# Patient Record
Sex: Male | Born: 1955 | Race: White | Hispanic: No | Smoking: Never smoker
Health system: Southern US, Community
[De-identification: ages and names within clinical notes are randomized; demographics above are authoritative.]

## PROBLEM LIST (undated history)

## (undated) ENCOUNTER — Ambulatory Visit (HOSPITAL_COMMUNITY): Admission: EM | Payer: Medicare HMO | Source: Home / Self Care

## (undated) DIAGNOSIS — I2692 Saddle embolus of pulmonary artery without acute cor pulmonale: Secondary | ICD-10-CM

## (undated) DIAGNOSIS — I82409 Acute embolism and thrombosis of unspecified deep veins of unspecified lower extremity: Secondary | ICD-10-CM

## (undated) DIAGNOSIS — B20 Human immunodeficiency virus [HIV] disease: Secondary | ICD-10-CM

## (undated) DIAGNOSIS — J45909 Unspecified asthma, uncomplicated: Secondary | ICD-10-CM

## (undated) DIAGNOSIS — E785 Hyperlipidemia, unspecified: Secondary | ICD-10-CM

## (undated) DIAGNOSIS — Z21 Asymptomatic human immunodeficiency virus [HIV] infection status: Secondary | ICD-10-CM

## (undated) DIAGNOSIS — B1910 Unspecified viral hepatitis B without hepatic coma: Secondary | ICD-10-CM

## (undated) DIAGNOSIS — J189 Pneumonia, unspecified organism: Secondary | ICD-10-CM

## (undated) DIAGNOSIS — F419 Anxiety disorder, unspecified: Secondary | ICD-10-CM

## (undated) DIAGNOSIS — K219 Gastro-esophageal reflux disease without esophagitis: Secondary | ICD-10-CM

## (undated) DIAGNOSIS — I2699 Other pulmonary embolism without acute cor pulmonale: Secondary | ICD-10-CM

## (undated) DIAGNOSIS — I10 Essential (primary) hypertension: Secondary | ICD-10-CM

## (undated) HISTORY — DX: Essential (primary) hypertension: I10

## (undated) HISTORY — DX: Hyperlipidemia, unspecified: E78.5

## (undated) HISTORY — PX: TONSILLECTOMY: SUR1361

## (undated) HISTORY — DX: Human immunodeficiency virus (HIV) disease: B20

## (undated) HISTORY — PX: COLONOSCOPY: SHX174

## (undated) HISTORY — PX: BACK SURGERY: SHX140

## (undated) HISTORY — PX: APPENDECTOMY: SHX54

## (undated) HISTORY — DX: Asymptomatic human immunodeficiency virus (hiv) infection status: Z21

## (undated) HISTORY — DX: Gastro-esophageal reflux disease without esophagitis: K21.9

---

## 1998-01-22 ENCOUNTER — Encounter: Admission: RE | Admit: 1998-01-22 | Discharge: 1998-01-22 | Payer: Self-pay | Admitting: Infectious Diseases

## 1998-01-31 ENCOUNTER — Encounter: Admission: RE | Admit: 1998-01-31 | Discharge: 1998-01-31 | Payer: Self-pay | Admitting: Infectious Diseases

## 1998-02-02 ENCOUNTER — Ambulatory Visit (HOSPITAL_COMMUNITY): Admission: RE | Admit: 1998-02-02 | Discharge: 1998-02-02 | Payer: Self-pay

## 1998-02-21 ENCOUNTER — Encounter: Admission: RE | Admit: 1998-02-21 | Discharge: 1998-02-21 | Payer: Self-pay | Admitting: Infectious Diseases

## 1998-02-23 ENCOUNTER — Encounter: Admission: RE | Admit: 1998-02-23 | Discharge: 1998-02-23 | Payer: Self-pay | Admitting: Infectious Diseases

## 1998-04-02 ENCOUNTER — Ambulatory Visit (HOSPITAL_COMMUNITY): Admission: RE | Admit: 1998-04-02 | Discharge: 1998-04-02 | Payer: Self-pay

## 1998-04-11 ENCOUNTER — Encounter: Admission: RE | Admit: 1998-04-11 | Discharge: 1998-04-11 | Payer: Self-pay | Admitting: Infectious Diseases

## 1998-04-16 ENCOUNTER — Encounter: Admission: RE | Admit: 1998-04-16 | Discharge: 1998-04-16 | Payer: Self-pay | Admitting: Infectious Diseases

## 1998-05-15 ENCOUNTER — Encounter: Admission: RE | Admit: 1998-05-15 | Discharge: 1998-05-15 | Payer: Self-pay | Admitting: Infectious Diseases

## 1998-06-22 ENCOUNTER — Ambulatory Visit (HOSPITAL_BASED_OUTPATIENT_CLINIC_OR_DEPARTMENT_OTHER): Admission: RE | Admit: 1998-06-22 | Discharge: 1998-06-22 | Payer: Self-pay

## 1998-07-09 ENCOUNTER — Encounter: Admission: RE | Admit: 1998-07-09 | Discharge: 1998-07-09 | Payer: Self-pay | Admitting: Infectious Diseases

## 1998-08-13 ENCOUNTER — Encounter: Admission: RE | Admit: 1998-08-13 | Discharge: 1998-08-13 | Payer: Self-pay | Admitting: Infectious Diseases

## 1998-08-13 ENCOUNTER — Ambulatory Visit (HOSPITAL_COMMUNITY): Admission: RE | Admit: 1998-08-13 | Discharge: 1998-08-13 | Payer: Self-pay | Admitting: Infectious Diseases

## 1998-08-14 ENCOUNTER — Encounter: Admission: RE | Admit: 1998-08-14 | Discharge: 1998-08-14 | Payer: Self-pay | Admitting: Infectious Diseases

## 1998-08-15 ENCOUNTER — Ambulatory Visit (HOSPITAL_COMMUNITY): Admission: RE | Admit: 1998-08-15 | Discharge: 1998-08-15 | Payer: Self-pay | Admitting: Infectious Diseases

## 1998-08-15 ENCOUNTER — Encounter: Admission: RE | Admit: 1998-08-15 | Discharge: 1998-08-15 | Payer: Self-pay | Admitting: Infectious Diseases

## 1998-10-02 ENCOUNTER — Encounter: Admission: RE | Admit: 1998-10-02 | Discharge: 1998-10-02 | Payer: Self-pay | Admitting: Infectious Diseases

## 1998-10-05 ENCOUNTER — Ambulatory Visit (HOSPITAL_COMMUNITY): Admission: RE | Admit: 1998-10-05 | Discharge: 1998-10-05 | Payer: Self-pay | Admitting: *Deleted

## 1998-10-15 ENCOUNTER — Encounter: Admission: RE | Admit: 1998-10-15 | Discharge: 1998-10-15 | Payer: Self-pay | Admitting: Infectious Diseases

## 1998-11-19 ENCOUNTER — Encounter: Admission: RE | Admit: 1998-11-19 | Discharge: 1998-11-19 | Payer: Self-pay | Admitting: Infectious Diseases

## 1999-01-16 ENCOUNTER — Ambulatory Visit (HOSPITAL_COMMUNITY): Admission: RE | Admit: 1999-01-16 | Discharge: 1999-01-16 | Payer: Self-pay | Admitting: Infectious Diseases

## 1999-02-13 ENCOUNTER — Encounter: Admission: RE | Admit: 1999-02-13 | Discharge: 1999-02-13 | Payer: Self-pay | Admitting: Infectious Diseases

## 1999-05-13 ENCOUNTER — Encounter: Admission: RE | Admit: 1999-05-13 | Discharge: 1999-05-13 | Payer: Self-pay | Admitting: Infectious Diseases

## 1999-05-13 ENCOUNTER — Ambulatory Visit (HOSPITAL_COMMUNITY): Admission: RE | Admit: 1999-05-13 | Discharge: 1999-05-13 | Payer: Self-pay | Admitting: Infectious Diseases

## 1999-05-27 ENCOUNTER — Encounter: Admission: RE | Admit: 1999-05-27 | Discharge: 1999-05-27 | Payer: Self-pay | Admitting: Infectious Diseases

## 1999-06-07 HISTORY — PX: SHOULDER ARTHROSCOPY W/ ROTATOR CUFF REPAIR: SHX2400

## 1999-07-01 ENCOUNTER — Encounter: Admission: RE | Admit: 1999-07-01 | Discharge: 1999-07-01 | Payer: Self-pay | Admitting: Infectious Diseases

## 1999-08-05 ENCOUNTER — Encounter: Admission: RE | Admit: 1999-08-05 | Discharge: 1999-08-05 | Payer: Self-pay | Admitting: Infectious Diseases

## 1999-09-09 ENCOUNTER — Encounter: Admission: RE | Admit: 1999-09-09 | Discharge: 1999-09-09 | Payer: Self-pay | Admitting: Infectious Diseases

## 1999-09-18 ENCOUNTER — Encounter: Admission: RE | Admit: 1999-09-18 | Discharge: 1999-09-18 | Payer: Self-pay | Admitting: Infectious Diseases

## 1999-10-16 ENCOUNTER — Encounter: Admission: RE | Admit: 1999-10-16 | Discharge: 1999-10-16 | Payer: Self-pay | Admitting: Infectious Diseases

## 1999-10-16 ENCOUNTER — Ambulatory Visit (HOSPITAL_COMMUNITY): Admission: RE | Admit: 1999-10-16 | Discharge: 1999-10-16 | Payer: Self-pay | Admitting: Infectious Diseases

## 1999-11-20 ENCOUNTER — Encounter: Admission: RE | Admit: 1999-11-20 | Discharge: 1999-11-20 | Payer: Self-pay | Admitting: Infectious Diseases

## 1999-12-11 ENCOUNTER — Encounter: Admission: RE | Admit: 1999-12-11 | Discharge: 1999-12-11 | Payer: Self-pay | Admitting: Infectious Diseases

## 1999-12-25 ENCOUNTER — Encounter: Admission: RE | Admit: 1999-12-25 | Discharge: 1999-12-25 | Payer: Self-pay | Admitting: Infectious Diseases

## 2000-01-29 ENCOUNTER — Encounter: Admission: RE | Admit: 2000-01-29 | Discharge: 2000-01-29 | Payer: Self-pay | Admitting: Infectious Diseases

## 2000-01-29 ENCOUNTER — Ambulatory Visit (HOSPITAL_COMMUNITY): Admission: RE | Admit: 2000-01-29 | Discharge: 2000-01-29 | Payer: Self-pay | Admitting: Infectious Diseases

## 2000-04-15 ENCOUNTER — Encounter: Admission: RE | Admit: 2000-04-15 | Discharge: 2000-04-15 | Payer: Self-pay | Admitting: Infectious Diseases

## 2000-04-15 ENCOUNTER — Ambulatory Visit (HOSPITAL_COMMUNITY): Admission: RE | Admit: 2000-04-15 | Discharge: 2000-04-15 | Payer: Self-pay | Admitting: Infectious Diseases

## 2000-05-06 ENCOUNTER — Encounter: Admission: RE | Admit: 2000-05-06 | Discharge: 2000-05-06 | Payer: Self-pay | Admitting: Infectious Diseases

## 2000-06-18 ENCOUNTER — Encounter: Admission: RE | Admit: 2000-06-18 | Discharge: 2000-06-18 | Payer: Self-pay | Admitting: Infectious Diseases

## 2000-06-19 ENCOUNTER — Encounter: Admission: RE | Admit: 2000-06-19 | Discharge: 2000-06-19 | Payer: Self-pay | Admitting: Infectious Diseases

## 2000-06-27 ENCOUNTER — Emergency Department (HOSPITAL_COMMUNITY): Admission: EM | Admit: 2000-06-27 | Discharge: 2000-06-27 | Payer: Self-pay | Admitting: Emergency Medicine

## 2000-06-30 ENCOUNTER — Emergency Department (HOSPITAL_COMMUNITY): Admission: EM | Admit: 2000-06-30 | Discharge: 2000-06-30 | Payer: Self-pay | Admitting: Emergency Medicine

## 2000-07-13 ENCOUNTER — Ambulatory Visit: Admission: RE | Admit: 2000-07-13 | Discharge: 2000-07-13 | Payer: Self-pay | Admitting: Infectious Diseases

## 2000-07-13 ENCOUNTER — Encounter: Admission: RE | Admit: 2000-07-13 | Discharge: 2000-07-13 | Payer: Self-pay | Admitting: Infectious Diseases

## 2000-07-27 ENCOUNTER — Encounter: Admission: RE | Admit: 2000-07-27 | Discharge: 2000-07-27 | Payer: Self-pay | Admitting: Infectious Diseases

## 2000-09-01 ENCOUNTER — Emergency Department (HOSPITAL_COMMUNITY): Admission: EM | Admit: 2000-09-01 | Discharge: 2000-09-02 | Payer: Self-pay | Admitting: Emergency Medicine

## 2000-09-12 ENCOUNTER — Inpatient Hospital Stay (HOSPITAL_COMMUNITY): Admission: AD | Admit: 2000-09-12 | Discharge: 2000-09-14 | Payer: Self-pay | Admitting: Infectious Diseases

## 2000-09-12 ENCOUNTER — Encounter: Payer: Self-pay | Admitting: Internal Medicine

## 2000-09-14 ENCOUNTER — Encounter: Payer: Self-pay | Admitting: Internal Medicine

## 2000-09-17 ENCOUNTER — Ambulatory Visit (HOSPITAL_COMMUNITY): Admission: RE | Admit: 2000-09-17 | Discharge: 2000-09-17 | Payer: Self-pay | Admitting: Infectious Diseases

## 2000-09-17 ENCOUNTER — Encounter: Payer: Self-pay | Admitting: Infectious Diseases

## 2000-09-21 ENCOUNTER — Encounter: Admission: RE | Admit: 2000-09-21 | Discharge: 2000-09-21 | Payer: Self-pay | Admitting: Infectious Diseases

## 2000-09-21 ENCOUNTER — Ambulatory Visit (HOSPITAL_COMMUNITY): Admission: RE | Admit: 2000-09-21 | Discharge: 2000-09-21 | Payer: Self-pay | Admitting: Infectious Diseases

## 2000-09-21 ENCOUNTER — Encounter: Payer: Self-pay | Admitting: Infectious Diseases

## 2000-09-22 ENCOUNTER — Encounter: Admission: RE | Admit: 2000-09-22 | Discharge: 2000-09-22 | Payer: Self-pay | Admitting: Infectious Diseases

## 2000-10-07 ENCOUNTER — Encounter: Admission: RE | Admit: 2000-10-07 | Discharge: 2000-10-07 | Payer: Self-pay | Admitting: Infectious Diseases

## 2000-10-27 ENCOUNTER — Ambulatory Visit (HOSPITAL_COMMUNITY): Admission: RE | Admit: 2000-10-27 | Discharge: 2000-10-27 | Payer: Self-pay | Admitting: Infectious Diseases

## 2000-10-27 ENCOUNTER — Encounter: Admission: RE | Admit: 2000-10-27 | Discharge: 2000-10-27 | Payer: Self-pay | Admitting: Infectious Diseases

## 2000-11-23 ENCOUNTER — Encounter: Admission: RE | Admit: 2000-11-23 | Discharge: 2000-11-23 | Payer: Self-pay | Admitting: Infectious Diseases

## 2001-03-15 ENCOUNTER — Encounter: Admission: RE | Admit: 2001-03-15 | Discharge: 2001-03-15 | Payer: Self-pay | Admitting: Infectious Diseases

## 2001-03-15 ENCOUNTER — Ambulatory Visit (HOSPITAL_COMMUNITY): Admission: RE | Admit: 2001-03-15 | Discharge: 2001-03-15 | Payer: Self-pay | Admitting: Infectious Diseases

## 2001-03-29 ENCOUNTER — Encounter: Admission: RE | Admit: 2001-03-29 | Discharge: 2001-03-29 | Payer: Self-pay | Admitting: Infectious Diseases

## 2001-06-15 ENCOUNTER — Ambulatory Visit (HOSPITAL_COMMUNITY): Admission: RE | Admit: 2001-06-15 | Discharge: 2001-06-15 | Payer: Self-pay | Admitting: Infectious Diseases

## 2001-06-15 ENCOUNTER — Encounter: Admission: RE | Admit: 2001-06-15 | Discharge: 2001-06-15 | Payer: Self-pay | Admitting: Infectious Diseases

## 2001-07-07 ENCOUNTER — Encounter: Admission: RE | Admit: 2001-07-07 | Discharge: 2001-07-07 | Payer: Self-pay | Admitting: Infectious Diseases

## 2003-07-17 ENCOUNTER — Encounter: Admission: RE | Admit: 2003-07-17 | Discharge: 2003-07-17 | Payer: Self-pay | Admitting: Internal Medicine

## 2004-01-04 ENCOUNTER — Emergency Department (HOSPITAL_COMMUNITY): Admission: EM | Admit: 2004-01-04 | Discharge: 2004-01-04 | Payer: Self-pay | Admitting: Family Medicine

## 2004-09-02 ENCOUNTER — Ambulatory Visit: Payer: Self-pay | Admitting: Internal Medicine

## 2004-10-21 ENCOUNTER — Ambulatory Visit: Payer: Self-pay | Admitting: Internal Medicine

## 2005-03-07 ENCOUNTER — Ambulatory Visit: Payer: Self-pay | Admitting: Internal Medicine

## 2005-03-18 ENCOUNTER — Ambulatory Visit: Payer: Self-pay | Admitting: Internal Medicine

## 2005-04-02 ENCOUNTER — Ambulatory Visit: Payer: Self-pay | Admitting: Internal Medicine

## 2006-06-17 ENCOUNTER — Ambulatory Visit (HOSPITAL_BASED_OUTPATIENT_CLINIC_OR_DEPARTMENT_OTHER): Admission: RE | Admit: 2006-06-17 | Discharge: 2006-06-17 | Payer: Self-pay | Admitting: Orthopedic Surgery

## 2006-10-01 ENCOUNTER — Ambulatory Visit: Payer: Self-pay | Admitting: Internal Medicine

## 2006-10-08 ENCOUNTER — Ambulatory Visit: Payer: Self-pay | Admitting: Internal Medicine

## 2006-11-30 ENCOUNTER — Ambulatory Visit: Payer: Self-pay | Admitting: Internal Medicine

## 2007-01-07 ENCOUNTER — Encounter (INDEPENDENT_AMBULATORY_CARE_PROVIDER_SITE_OTHER): Payer: Self-pay | Admitting: Infectious Diseases

## 2007-07-10 ENCOUNTER — Ambulatory Visit: Payer: Self-pay | Admitting: Internal Medicine

## 2007-08-05 DIAGNOSIS — B20 Human immunodeficiency virus [HIV] disease: Secondary | ICD-10-CM

## 2007-08-10 ENCOUNTER — Ambulatory Visit: Payer: Self-pay | Admitting: Infectious Diseases

## 2007-08-10 LAB — CONVERTED CEMR LAB
ALT: 33 units/L (ref 0–53)
Basophils Relative: 0 % (ref 0–1)
Eosinophils Absolute: 0.2 10*3/uL (ref 0.0–0.7)
Eosinophils Relative: 3 % (ref 0–5)
HCT: 45.6 % (ref 39.0–52.0)
Lymphs Abs: 2.5 10*3/uL (ref 0.7–3.3)
MCHC: 34.2 g/dL (ref 30.0–36.0)
MCV: 101.1 fL — ABNORMAL HIGH (ref 78.0–100.0)
Monocytes Relative: 6 % (ref 3–11)
Neutrophils Relative %: 59 % (ref 43–77)
RBC: 4.51 M/uL (ref 4.22–5.81)
WBC: 7.9 10*3/uL (ref 4.0–10.5)

## 2007-08-17 ENCOUNTER — Encounter: Payer: Self-pay | Admitting: Internal Medicine

## 2007-08-17 ENCOUNTER — Encounter: Admission: RE | Admit: 2007-08-17 | Discharge: 2007-08-17 | Payer: Self-pay | Admitting: Infectious Diseases

## 2007-08-17 ENCOUNTER — Ambulatory Visit: Payer: Self-pay | Admitting: Infectious Diseases

## 2007-08-17 LAB — CONVERTED CEMR LAB
AST: 23 units/L (ref 0–37)
Albumin: 4.6 g/dL (ref 3.5–5.2)
Alkaline Phosphatase: 57 units/L (ref 39–117)
BUN: 18 mg/dL (ref 6–23)
Basophils Relative: 0 % (ref 0–1)
Creatinine, Ser: 1.08 mg/dL (ref 0.40–1.50)
Eosinophils Absolute: 0.2 10*3/uL (ref 0.2–0.7)
Eosinophils Relative: 3 % (ref 0–5)
Glucose, Bld: 98 mg/dL (ref 70–99)
HCT: 45.6 % (ref 39.0–52.0)
HDL: 39 mg/dL — ABNORMAL LOW (ref >39)
Hemoglobin: 15.6 g/dL (ref 13.0–17.0)
Lymphs Abs: 2.6 10*3/uL (ref 0.7–4.0)
MCHC: 34.2 g/dL (ref 30.0–36.0)
MCV: 98.9 fL (ref 78.0–100.0)
Monocytes Absolute: 0.5 10*3/uL (ref 0.1–1.0)
Monocytes Relative: 6 % (ref 3–12)
Potassium: 4.1 meq/L (ref 3.5–5.3)
RBC: 4.61 M/uL (ref 4.22–5.81)
Total Bilirubin: 0.4 mg/dL (ref 0.3–1.2)
Total CHOL/HDL Ratio: 5.3
Triglycerides: 583 mg/dL — ABNORMAL HIGH (ref <150)
WBC: 7.8 10*3/uL (ref 4.0–10.5)

## 2007-08-23 ENCOUNTER — Telehealth: Payer: Self-pay | Admitting: Infectious Diseases

## 2007-10-18 ENCOUNTER — Encounter: Payer: Self-pay | Admitting: *Deleted

## 2007-10-18 DIAGNOSIS — A630 Anogenital (venereal) warts: Secondary | ICD-10-CM

## 2007-10-18 DIAGNOSIS — J45909 Unspecified asthma, uncomplicated: Secondary | ICD-10-CM | POA: Insufficient documentation

## 2007-10-18 DIAGNOSIS — D1803 Hemangioma of intra-abdominal structures: Secondary | ICD-10-CM | POA: Insufficient documentation

## 2007-10-18 DIAGNOSIS — B1910 Unspecified viral hepatitis B without hepatic coma: Secondary | ICD-10-CM | POA: Insufficient documentation

## 2007-10-18 DIAGNOSIS — J301 Allergic rhinitis due to pollen: Secondary | ICD-10-CM

## 2007-10-18 DIAGNOSIS — F329 Major depressive disorder, single episode, unspecified: Secondary | ICD-10-CM

## 2007-10-18 DIAGNOSIS — Z9089 Acquired absence of other organs: Secondary | ICD-10-CM

## 2007-10-18 DIAGNOSIS — J189 Pneumonia, unspecified organism: Secondary | ICD-10-CM

## 2007-10-18 DIAGNOSIS — Z9889 Other specified postprocedural states: Secondary | ICD-10-CM

## 2007-10-18 DIAGNOSIS — E785 Hyperlipidemia, unspecified: Secondary | ICD-10-CM

## 2007-10-18 DIAGNOSIS — K219 Gastro-esophageal reflux disease without esophagitis: Secondary | ICD-10-CM | POA: Insufficient documentation

## 2007-11-04 ENCOUNTER — Encounter: Payer: Self-pay | Admitting: Internal Medicine

## 2007-12-02 ENCOUNTER — Encounter: Payer: Self-pay | Admitting: Internal Medicine

## 2007-12-30 ENCOUNTER — Encounter: Payer: Self-pay | Admitting: Internal Medicine

## 2008-01-20 ENCOUNTER — Encounter: Payer: Self-pay | Admitting: Internal Medicine

## 2008-05-26 ENCOUNTER — Encounter: Payer: Self-pay | Admitting: Internal Medicine

## 2008-07-21 ENCOUNTER — Ambulatory Visit: Payer: Self-pay | Admitting: Internal Medicine

## 2008-07-31 ENCOUNTER — Telehealth: Payer: Self-pay | Admitting: Internal Medicine

## 2008-09-26 ENCOUNTER — Ambulatory Visit: Payer: Self-pay | Admitting: Sports Medicine

## 2008-09-26 DIAGNOSIS — G5760 Lesion of plantar nerve, unspecified lower limb: Secondary | ICD-10-CM

## 2008-09-26 DIAGNOSIS — M722 Plantar fascial fibromatosis: Secondary | ICD-10-CM

## 2008-10-02 ENCOUNTER — Telehealth (INDEPENDENT_AMBULATORY_CARE_PROVIDER_SITE_OTHER): Payer: Self-pay | Admitting: *Deleted

## 2008-10-06 HISTORY — PX: ANTERIOR CERVICAL DECOMP/DISCECTOMY FUSION: SHX1161

## 2008-10-12 ENCOUNTER — Encounter: Payer: Self-pay | Admitting: Internal Medicine

## 2008-10-23 ENCOUNTER — Telehealth: Payer: Self-pay | Admitting: Internal Medicine

## 2008-10-23 ENCOUNTER — Ambulatory Visit: Payer: Self-pay | Admitting: Internal Medicine

## 2008-10-23 LAB — CONVERTED CEMR LAB
BUN: 15 mg/dL (ref 6–23)
CO2: 29 meq/L (ref 19–32)
Calcium: 9.1 mg/dL (ref 8.4–10.5)
Glucose, Bld: 105 mg/dL — ABNORMAL HIGH (ref 70–99)
Sodium: 141 meq/L (ref 135–145)

## 2008-10-24 ENCOUNTER — Telehealth: Payer: Self-pay | Admitting: Internal Medicine

## 2008-11-03 ENCOUNTER — Ambulatory Visit: Payer: Self-pay | Admitting: Sports Medicine

## 2008-11-03 ENCOUNTER — Encounter (INDEPENDENT_AMBULATORY_CARE_PROVIDER_SITE_OTHER): Payer: Self-pay | Admitting: *Deleted

## 2008-11-03 ENCOUNTER — Encounter: Admission: RE | Admit: 2008-11-03 | Discharge: 2008-11-03 | Payer: Self-pay | Admitting: Sports Medicine

## 2008-11-03 ENCOUNTER — Telehealth: Payer: Self-pay | Admitting: Internal Medicine

## 2008-11-03 DIAGNOSIS — M79609 Pain in unspecified limb: Secondary | ICD-10-CM

## 2008-11-06 ENCOUNTER — Encounter (INDEPENDENT_AMBULATORY_CARE_PROVIDER_SITE_OTHER): Payer: Self-pay | Admitting: *Deleted

## 2008-11-10 ENCOUNTER — Ambulatory Visit (HOSPITAL_COMMUNITY): Admission: RE | Admit: 2008-11-10 | Discharge: 2008-11-10 | Payer: Self-pay | Admitting: Sports Medicine

## 2008-11-12 ENCOUNTER — Encounter: Payer: Self-pay | Admitting: Sports Medicine

## 2008-11-13 ENCOUNTER — Encounter: Payer: Self-pay | Admitting: Sports Medicine

## 2008-11-14 ENCOUNTER — Encounter: Payer: Self-pay | Admitting: Sports Medicine

## 2008-11-21 ENCOUNTER — Ambulatory Visit: Payer: Self-pay | Admitting: Sports Medicine

## 2008-11-30 ENCOUNTER — Ambulatory Visit: Payer: Self-pay | Admitting: Sports Medicine

## 2008-12-25 ENCOUNTER — Ambulatory Visit: Payer: Self-pay | Admitting: Sports Medicine

## 2009-01-01 ENCOUNTER — Ambulatory Visit: Payer: Self-pay | Admitting: Sports Medicine

## 2009-01-24 ENCOUNTER — Ambulatory Visit: Payer: Self-pay | Admitting: Internal Medicine

## 2009-01-24 DIAGNOSIS — H8309 Labyrinthitis, unspecified ear: Secondary | ICD-10-CM | POA: Insufficient documentation

## 2009-03-09 ENCOUNTER — Ambulatory Visit: Payer: Self-pay | Admitting: Internal Medicine

## 2009-03-09 DIAGNOSIS — R1013 Epigastric pain: Secondary | ICD-10-CM

## 2009-03-15 ENCOUNTER — Telehealth: Payer: Self-pay | Admitting: Internal Medicine

## 2009-03-15 LAB — CONVERTED CEMR LAB
BUN: 13 mg/dL (ref 6–23)
Basophils Absolute: 0.1 10*3/uL (ref 0.0–0.1)
Chloride: 103 meq/L (ref 96–112)
Creatinine, Ser: 1 mg/dL (ref 0.4–1.5)
Eosinophils Absolute: 0.1 10*3/uL (ref 0.0–0.7)
GFR calc non Af Amer: 82.55 mL/min (ref >60)
Glucose, Bld: 91 mg/dL (ref 70–99)
H Pylori IgG: NEGATIVE
HCT: 46 % (ref 39.0–52.0)
Ketones, ur: NEGATIVE mg/dL
Leukocytes, UA: NEGATIVE
Lipase: 33 units/L (ref 11.0–59.0)
Lymphs Abs: 2.1 10*3/uL (ref 0.7–4.0)
MCV: 99.4 fL (ref 78.0–100.0)
Monocytes Absolute: 0.5 10*3/uL (ref 0.1–1.0)
Platelets: 143 10*3/uL — ABNORMAL LOW (ref 150.0–400.0)
Potassium: 3.8 meq/L (ref 3.5–5.1)
RDW: 12.3 % (ref 11.5–14.6)
Specific Gravity, Urine: 1.01 (ref 1.000–1.030)
Total Bilirubin: 0.8 mg/dL (ref 0.3–1.2)
Urobilinogen, UA: 0.2 (ref 0.0–1.0)

## 2009-04-05 ENCOUNTER — Encounter: Payer: Self-pay | Admitting: Internal Medicine

## 2009-06-26 ENCOUNTER — Ambulatory Visit: Payer: Self-pay | Admitting: Internal Medicine

## 2009-07-03 ENCOUNTER — Ambulatory Visit: Payer: Self-pay | Admitting: Internal Medicine

## 2009-07-19 ENCOUNTER — Encounter: Payer: Self-pay | Admitting: Internal Medicine

## 2009-10-11 ENCOUNTER — Encounter: Payer: Self-pay | Admitting: Internal Medicine

## 2009-10-20 ENCOUNTER — Encounter: Admission: RE | Admit: 2009-10-20 | Discharge: 2009-10-20 | Payer: Self-pay | Admitting: Chiropractic Medicine

## 2010-01-10 ENCOUNTER — Encounter: Payer: Self-pay | Admitting: Internal Medicine

## 2010-06-14 ENCOUNTER — Encounter: Payer: Self-pay | Admitting: Internal Medicine

## 2010-06-20 ENCOUNTER — Telehealth: Payer: Self-pay | Admitting: Internal Medicine

## 2010-06-27 ENCOUNTER — Ambulatory Visit (HOSPITAL_BASED_OUTPATIENT_CLINIC_OR_DEPARTMENT_OTHER): Admission: RE | Admit: 2010-06-27 | Discharge: 2010-06-27 | Payer: Self-pay | Admitting: Orthopedic Surgery

## 2010-07-11 ENCOUNTER — Encounter: Payer: Self-pay | Admitting: Internal Medicine

## 2010-10-27 ENCOUNTER — Encounter: Payer: Self-pay | Admitting: Sports Medicine

## 2010-11-07 NOTE — Letter (Signed)
Summary: Infectious Disease/[facility]  Infectious Disease/[facility]   Imported By: Sherian Rein 07/19/2010 12:13:28  _____________________________________________________________________  External Attachment:    Type:   Image     Comment:   External Document

## 2010-11-07 NOTE — Miscellaneous (Signed)
Summary: Flu Vaccination/[facility]  Flu Vaccination/[facility]   Imported By: Sherian Rein 06/24/2010 12:22:06  _____________________________________________________________________  External Attachment:    Type:   Image     Comment:   External Document

## 2010-11-07 NOTE — Progress Notes (Signed)
    Immunization History:  Influenza Immunization History:    Influenza:  historical (06/14/2010)

## 2010-11-07 NOTE — Letter (Signed)
Summary: [facility]   Imported By: Sherian Rein 01/28/2010 10:56:25  _____________________________________________________________________  External Attachment:    Type:   Image     Comment:   External Document

## 2010-11-07 NOTE — Letter (Signed)
Summary: Infectious Disease/[facility]  Infectious Disease/[facility]   Imported By: Sherian Rein 11/12/2009 09:53:25  _____________________________________________________________________  External Attachment:    Type:   Image     Comment:   External Document

## 2010-12-19 LAB — BASIC METABOLIC PANEL
Calcium: 9.4 mg/dL (ref 8.4–10.5)
GFR calc Af Amer: >60 mL/min (ref >60)
GFR calc non Af Amer: 52 mL/min — ABNORMAL LOW (ref >60)
Glucose, Bld: 104 mg/dL — ABNORMAL HIGH (ref 70–99)
Sodium: 137 mEq/L (ref 135–145)

## 2011-02-21 NOTE — Discharge Summary (Signed)
[name]  Patient:    Michael [name], Michael [name]                       MRN: [id number] Adm. Date:  [id number] Disc. Date: [id number] Attending:  Alfonso Ramus CC:         Outpatient Clinic  Dr. Roxan Hockey.   Discharge Summary  DIAGNOSES: 1.   Pneumonia. 2.   Human immunodeficiency virus. 3.   Depression with anxiety. 4.   History of lipodystrophy.  DISCHARGE MEDICATIONS: 1.   Levaquin 500 mg p.o. q d. 2.   DDI EC 400 mg 1 tab p.o. q h.s. 3.   Kaletra 3 tabs p.o. b.i.d. 4.   Delavirdine 200 mg 3 tabs p.o. b.i.d. 5.   Alprazolam 1 mg p.o. t.i.d. 6.   Klonopin 0.5 mg p.o. t.i.d. p.r.n. anxiety  FOLLOW-UP:  The patient will schedule a follow-up appointment with Dr. Roxan Hockey to evaluate recovery from his pneumonia.  PROCEDURES: 1.   Chest x-ray on 09/12/00 showing an infiltrate in the right lower lobe      suggestive of viral or atypical pneumonia. 2.   Abdominal ultrasound on 09/12/00 showing echogenic liver infiltrates      consistent with hemangiomata but no gallstones. 3.   Chest x-ray on 09/14/00 showing improvement in the infiltrative process      from 09/12/00.  CONSULTANTS:  None.  ADMISSION HISTORY AND PHYSICAL:  Mr. Michael [name] is a 55 year old white male with a history of HIV which was diagnosed in 63 who presented with three day history of fever, headache, malaise, and fatigue.  His last CD4 count was in October of this year and was 400 and he had an undetectable viral load at this time.  His current treatment regimen included DDI, Kaletra, and delavirdine. The patient reports he has had a fever since three days prior to admission with the highest being 103.6.  He also acknowledges anorexia and lethargy but nausea or vomiting.  He has had a slight mild shortness of breath as well as a slight but nonproductive cough.  He went to Urgent Care two days prior to admission and was given Augmentin and Advil without relief of symptoms but with  precipitation of diarrhea.  He also complains of a headache which began frontally and spread to the back of the neck and has been constant since three days prior to admission.  He denies photophobia with it as well as visual changes and denies stiffness of the neck.  The patient reports having had a flu shot in October as well as the pneumovac four to five years ago and has had no known sick contacts.  PAST MEDICAL HISTORY:  Significant for HIV and depression as well as renal stones secondary to Crixivan and idiopathic anaphylactic reactions the last of which occurred on November 28.  MEDICATIONS: 1.   Klonopin 0.5 mg t.i.d. 2.   Xanax 1 mg p.o. t.i.d. 3.   Videx 100 mg q h.s. 4.   Kaletra 3 tabs p.o. b.i.d. 5.   Delavirdine 200 mg 3 tabs p.o. bid 6.   Allegra 60 mg p.o. b.i.d.  FAMILY HISTORY: Significant for a mother who died of CVA and also had diabetes mellitus and multiple sclerosis.  Others are alive and well.  There are no siblings.  SOCIAL HISTORY:   Patient lives in [place] with his partner and is a Higher education careers adviser at [employer].   He denies cigarette, alcohol, or drug use.  PHYSICAL EXAM:  VITAL SIGNS:  Temperature 101.6, pulse 104, blood pressure 140/80, respirations 20, O2 saturation 95% on room air.  GENERAL:  This is a pleasant, well nourished male lying in bed in no acute distress.  HEENT:  Oropharynx is clear.  Mucous membranes moist.  NECK:  Supple without thyromegaly or lymphadenopathy.  CARDIOVASCULAR: Regular rate and rhythm although hypodynamic and tachycardic with pulses 2+ and cap refill within normal limits.  LUNGS:  Bibasilar crackles, right greater than left, and mild CVA tenderness on the right.  ABDOMEN:  Soft, nondistended with normal active bowel sounds but some mild tenderness in the right upper quadrant.  RECTAL:  No masses or tenderness.  He had Hemoccult negative stool.  NEUROLOGIC:  Cranial nerves 2-12 are grossly intact.   Strength is 5/5.  The patient is alert and oriented times three and sensation was grossly intact.  SKIN:  Without rashes and without lipodystrophy; warm to touch.  LABORATORY DATA ON ADMISSION:  BMP 134 for sodium, potassium 3.8, chloride 103, bicarb 24.0, BUN 8.0, creatinine 1.1, glucose 110.  CBC 14.8, white count 15.2, hemoglobin 40.8, hematocrit _____, platelets 185,000, ANC 11.8. Additionally, CK 77, lactic acid 1.4.  TSH 2.157, lipase 19.0, T bili 1.3, alka phos 74, AST 20, ALT 21, albumin 3.4.  An ABG on room air showed pH 7.5, PCO2 29.2, PO2 77.5.  HOSPITAL COURSE:  The patient was admitted with concerns that he may have pneumonia including PCP, community acquired, Mycoplasma influenza or other atypical.  Also mitochondrial toxicity secondary to heart therapy, a UTI and cholecystitis.  The patients antiretrovirals were stopped.  He was begun on IV fluids and given Tylenol p.r.n. for the fever.  Additionally Tequin was started as empiric treatment for pneumonia.  Due to the patients right upper quadrant tenderness and concerns of possible a calculus cholecystitis, a right upper quadrant ultrasound was performed which showed no stones or thickening of the gallbladder wall.  A urine culture and UA was performed to rule out urinary tract infection.  Overnight, the first night, the patient continued to have a sustained fever despite Tylenol therapy.  He was continued on the IV fluids and also encouraged to take liquids by mouth.  On the day following admission, the patient was somewhat better although still complaining of persistent fever and headache because the mitochondrial toxicity appeared to be ruled out with a normal lactic acid level, normal CK level, and normal LFTs, his antiretroviral therapy was restarted at this time.  On the day of discharge, which was two days after admission, the patient was feeling much  better.  He was no longer febrile.  Denied current malaise,  myalgia, or headache or shortness of breath.  A repeat chest x-ray on the morning of discharge showed a decrease in the infiltrates and on exam, the patient had a temperature of 98.7, pulse 92, respirations 20, and blood pressure 138/82. The patient was alert and oriented and in good spirits.  Heart rate was still regular.  Lungs had crackles with the right greater than left but decreased from day of admission.  As stated above, the chest x-ray had improved from the admission chest x-ray.  It was decided that the patient probably had an influenza or atypical pneumonia, however, he remained on the antibiotics p.o. at discharge to cover for a possible community acquired pneumonia.  Blood and urine cultures were also negative at the time of discharge.  Laboratories on the day of discharge revealed a sodium 139, potassium  4.0, chloride 102, bicarb 27.0, BUN 12.0, creatinine 1.3, glucose 115.  CBC showed white count 15.4, hemoglobin 16.5, hematocrit 45.5, platelets 235,000.  DISCHARGE INSTRUCTIONS:  The patient was discharged to home and told to continue bedrest at home until he felt better at which time he could return to his regular daily activities.  He is also encouraged to maintain good p.o. intake should the fever return and take Tylenol for persistent fevers.  The patient will follow-up with Dr. Roxan Hockey in approximately one week. DD:  09/14/00 TD:  09/14/00 Job: [id number]

## 2011-02-21 NOTE — Assessment & Plan Note (Signed)
[facility]                           PRIMARY CARE OFFICE NOTE   NAME:Delangel, KIPPY GOHMAN                       MRN:          [id number]  DATE:11/30/2006                            DOB:          1956-07-03    Mr. Osment is a pleasant 55 year old Caucasian gentleman who presents  today for wellness examination.  He was last seen in the office October 08, 2006 for blood pressure follow up and subsequently he has been  started on low dose hydrochlorothiazide, which he is tolerating well.   PAST SURGICAL HISTORY:  1. Tonsillectomy in 1978.  2. Laparoscopic appendectomy in 2004.  3. Removal of condyloma warts in the past.   MEDICAL ILLNESS:  1. Usual childhood disease.  2. Childhood asthma.  3. Genital warts.  4. Gastrointestinal reflux disease.  5. 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  6. Hyperlipidemia.  7. Hay fever allergies.  8. HIV disease diagnosed in 1985, followed by Dr. Jiles Prows at      [facility].  9. History of pneumonia with hospitalization in 2002.  10.Probable hemangioma of the liver by ultrasound.  11.History of depression.   CURRENT MEDICATIONS:  1. Truvada daily.  2. Ziagen 300 mg b.i.d.  3. Norvir 100 mg 2 tablets b.i.d.  4. Nexium 20 mg daily.  5. Acyclovir 400 mg b.i.d.  6. Lipitor 10 mg daily.  7. Xanax XR 3 mg daily.  8. HCTZ 12.5 mg daily.  9. A list of antiviral drugs is not complete.   HABITS:  Tobacco, none.  Alcohol, averaging 2 ounces per month.   FAMILY HISTORY:  Positive for arthritis in grandparents.  Father with  hyperlipidemia.  Grandfather with MI at age 26, but lived to be 70.  Mother with CVA, died at age 20 with complications of multiple  sclerosis, diabetes as well as her CVA.  Diabetes in her brother and  grandmother.  Pancreatic cancer in a maternal grandmother.  No family  history for lung cancer, colon cancer or prostate cancer.   SOCIAL HISTORY:  The patient has an undergraduate  degree from  __________.  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  He has  obtained an MPH in epidemiology and public health.  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  The patient is in a long term  monogamous relationship.   REVIEW OF SYSTEMS:  The patient has no constitutional, cardiovascular,  respiratory, GI or GU complaints.  The patient does report that he is  intolerant of fat in his diet, leading to significant problems with  severe reflux and nausea and vomiting.  The patient reports he did  undergo right arthroscopy for should repair in September of 2007 and has  made a full recovery.  He does have some knee pain, attribute a loss to  arthritis and he has given up high impact sports training.   EXAMINATION:  Temperature was 98.3, blood pressure 132/79, pulse 63,  weight 172.  GENERAL:  A well-nourished, athletic appearing gentleman in no acute  distress.  HEENT:  Normocephalic, atraumatic, EACs and TMs were unremarkable.  Oropharynx with native dentition in good repair, no buccal or palatal  lesions were noted.  Posterior pharynx was clear.  Conjunctivae and  sclerae were clear.  PERRLA,  EOMI.  Funduscopic exam with hand-held  instrument was unremarkable with normal disc margins and normal retina.  NECK:  Supple without thyromegaly, no cervical lymphadenopathy was noted  in the submandibular, cervical, supraclavicular region.  CHEST:  No CVA tenderness.  LUNGS:  Clear to auscultation and percussion.  CARDIOVASCULAR:  2+ radial pulse, no jugular venous distension, no  carotid bruits.  He had a quiet pericardium with a regular rate and  rhythm without murmurs, rubs or gallops.  ABDOMEN:  Soft, no guarding, no rebound, no organo splenomegaly was  noted.  RECTAL:  Normal sphincter tone was noted.  PROSTATE:  Flat without nodules or abnormalities.  EXTREMITIES:  Without clubbing, cyanosis, edema, deformity.  NEUROLOGIC:  Nonfocal.   LABORATORY:  No  labs were available since he is followed closely at  [facility]  I have requested labs from the last 1-2 years.   CHART REVIEW:  The patient had a full colonoscopy April 02, 2005 which  was negative and he would be due for follow up in 10 years.  X-ray, the  patient did have a CT scan of the abdomen and pelvis December of 2001,  which showed 2 well circumscribed liver lesions typical for hemangiomas.  The patient had a chest x-ray on September 21, 2000, which showed a  resolution of patchy infiltrates.  No recent chest x-ray available to  me.   ASSESSMENT/PLAN:  1. This is a normal wellness examination for this gentleman.  2. Lipids, the patient is followed by Dr. Sheral Flow who has his laboratory      and manages his lipid medication.  3. Anxiety, well controlled with Xanax XR.  4. Gastrointestinal, well controlled with Nexium 20 mg q. a.m.  5. Health maintenance, the patient is currently with colorectal cancer      screening.  Normal prostate exam was performed.  We discussed the      pros and cons of PSA screening, at this time defer.   In summary, he is a very pleasant gentleman who has recovered nicely  from his shoulder surgery.  He seems to be medically stable and in good  health.  He has asked to return to see me on a p.r.n. basis.     Rosalyn Gess Norins, MD  Electronically Signed    MEN/MedQ  DD: 11/30/2006  DT: 12/01/2006  Job #: [id number]   cc:   Lysbeth Galas, MD

## 2011-02-21 NOTE — Op Note (Signed)
[name], Michael                ACCOUNT NO.:  [id number]   MEDICAL RECORD NO.:  [id number]          PATIENT TYPE:  AMB   LOCATION:  [facility]                          FACILITY:  [facility]   PHYSICIAN:  Loreta Ave, M.D. DATE OF BIRTH:  04/28/1956   DATE OF PROCEDURE:  06/17/2006  DATE OF DISCHARGE:                                 OPERATIVE REPORT   PREOPERATIVE DIAGNOSES:  Right shoulder impingement, inferior labrum tear,  distal clavicle osteolysis.   POSTOPERATIVE DIAGNOSES:  Right shoulder impingement, inferior labrum tear,  distal clavicle osteolysis.   PROCEDURES:  Right shoulder examination under anesthesia, arthroscopy with  debridement of labrum and rotator cuff, acromioplasty, coracoacromial  ligament release, excision of distal clavicle.   SURGEON:  Loreta Ave, M.D.   ASSISTANT:  Genene Churn. Denton Meek.   ANESTHESIA:  General.   BLOOD LOSS:  Minimal.   SPECIMENS:  None.   CULTURES:  None.   COMPLICATIONS:  None.   DRESSING:  Soft compressive with sling.   DESCRIPTION OF PROCEDURE:  The patient was brought to the operating room,  and after adequate anesthesia had been obtained, the shoulder was examined.  Full motion and good stability.  Placed in a beach-chair position on the  shoulder positioner and prepped and draped in the usual sterile fashion.  Three portals, anterior, posterior and lateral.  The shoulder was entered  with a blunt obturator, distended and inspected.  A fair amount of partial  tearing, crescent region, supraspinatus tendon; undersurface debrided.  No  full-thickness tears.  Cable attached.  Biceps tendon, biceps anchor intact.  Complex tearing, inferior labrum, with a small paralabral cyst off the  bottom of the glenoid; all of this debrided, tapered and smoothed.  Remaining articular cartilage of other structures in the shoulder looked  good.  Cannula redirected subacromially.  Reactive bursitis, bony  impingement, type 2 acromion,  grade 3 chondral changes, impingement of AC  joint.  Bursa bursectomy, cuff debrided.  Acromioplasty to a type I acromion  with shaver and a high-speed bur.  Distal clavicle exposed and peri-  articular spurs and the lateral centimeter of clavicle resected.  Adequacy  of decompression and clavicle excision confirmed viewing from all portals.  Instruments and fluid removed.  The portals were closed with 4-0 nylon.  A  sterile compressive dressing was applied.  A sling was applied.  Anesthesia  was reversed.  Brought to the recovery room.  Tolerated the surgery well  with no complications.      Loreta Ave, M.D.  Electronically Signed     DFM/MEDQ  D:  06/17/2006  T:  06/17/2006  Job:  [id number]

## 2011-02-21 NOTE — Assessment & Plan Note (Signed)
[facility]                           PRIMARY CARE OFFICE NOTE   NAME:Michael [name], Michael [name]                       MRN:          [id number]  DATE:10/08/2006                            DOB:          [birth year]    [name] is a 55 year old gentleman, well known to the practice.  He  has been seen for several blood pressure checks because of a  persistently mild rising blood pressure.  He presents today for  followup, and was found to have a blood pressure of 143/80.  At the last  office visit in June 2002, it was 140/80.  The patient did have a blood  pressure check recently that was also showing a mild elevation.  He has  been asymptomatic.   We discussed this at length in regard to ideal blood pressure of 120/80  and blood pressure treatment goals.  At this point, given the patient's  past medical history, I think it is reasonable to consider starting a  low-dose diuretic in order to bring his blood pressure into the 130  range or lower for adequate control.  He was subsequently started on  hydrochlorothiazide 12.5 mg daily.   The patient is due for a full physical exam.  We did discuss the  relative merits of PSA screening and the controversies therein.  He will  schedule an extended office visit for a physical exam at his  convenience.     Rosalyn Gess Norins, MD  Electronically Signed    MEN/MedQ  DD: 10/09/2006  DT: 10/09/2006  Job #: [id number]

## 2011-07-04 ENCOUNTER — Telehealth: Payer: Self-pay | Admitting: Cardiovascular Disease

## 2011-07-04 NOTE — Telephone Encounter (Signed)
Pt was scheduled for 10-1 for surgical clearence and pt is requesting an echo

## 2011-07-04 NOTE — Telephone Encounter (Signed)
Patient has an surgical clearance office visit on 07/07/11 with Dr. Clifton James, patient is requesting to have an echo done the same day. He wants to make sure his heart is okay because he had an EKG done and it was bad. Patient was made aware that Dr. Clifton James needs to agree for him to have an echo before we can  order it .  I Will send this message go MD's desktop. Okay with patient.

## 2011-07-07 ENCOUNTER — Encounter: Payer: Self-pay | Admitting: Cardiovascular Disease

## 2011-07-07 ENCOUNTER — Ambulatory Visit (INDEPENDENT_AMBULATORY_CARE_PROVIDER_SITE_OTHER): Payer: Self-pay | Admitting: Cardiovascular Disease

## 2011-07-07 VITALS — BP 129/78 | HR 79 | Ht 65.5 in | Wt 176.4 lb

## 2011-07-07 DIAGNOSIS — Z01810 Encounter for preprocedural cardiovascular examination: Secondary | ICD-10-CM

## 2011-07-07 DIAGNOSIS — R9431 Abnormal electrocardiogram [ECG] [EKG]: Secondary | ICD-10-CM

## 2011-07-07 NOTE — Patient Instructions (Signed)
Your physician recommends that you schedule a follow-up appointment as needed with Dr. McAlhany   

## 2011-07-07 NOTE — Progress Notes (Signed)
History of Present Illness:55 yo male with history of HTN, HLD, HIV, GERD who is here today for cardiac evaluation prior to planned cosmetic eyelid surgery (bilateral upper blepharoplasty). He had an EKG as part of his pre-surgical workup which showed normal sinus rhythm with small isolated Q-wave in lead III. No other abnormalities.   He tells me that he feels well. He has had no chest pain, SOB, palpitations. He has been exercising at [employer].   Past Medical History  Diagnosis Date  . HTN (hypertension)   . Hyperlipidemia   . HIV (human immunodeficiency virus infection)   . GERD (gastroesophageal reflux disease)     Past Surgical History  Procedure Date  . Rotator cuff surgery     right and left  . Laminectomy     C4/C5  . Appendectomy     Current Outpatient Prescriptions  Medication Sig Dispense Refill  . ALPRAZolam (XANAX XR) 3 MG 24 hr tablet Take 3 mg by mouth daily.        Marland Kitchen atorvastatin (LIPITOR) 10 MG tablet Take 10 mg by mouth daily.        . darunavir (PREZISTA) 400 MG tablet Take 800 mg by mouth daily.        Marland Kitchen emtricitabine-tenofovir (TRUVADA) 200-300 MG per tablet Take 1 tablet by mouth daily.        Marland Kitchen esomeprazole (NEXIUM) 40 MG capsule Take 40 mg by mouth 2 (two) times daily.        . Omega-3 Fatty Acids (FISH OIL) 1000 MG CAPS Take 1 capsule by mouth 2 (two) times daily.        . raltegravir (ISENTRESS) 400 MG tablet Take 400 mg by mouth 2 (two) times daily.        . ritonavir (NORVIR) 100 MG capsule Take 200 mg by mouth 2 (two) times daily.          Allergies  Allergen Reactions  . Efavirenz     History   Social History  . Marital Status: Divorced    Spouse Name: N/A    Number of Children: N/A  . Years of Education: N/A   Occupational History  . Clinical research [employer]   Social History Main Topics  . Smoking status: Never Smoker   . Smokeless tobacco: Never Used  . Alcohol Use: Not on file  . Drug Use: Not on file  . Sexually Active: Not on file    Other Topics Concern  . Not on file   Social History Narrative  . No narrative on file    Family History  Problem Relation Age of Onset  . Diabetes Mother   . Heart attack Paternal Grandfather     Review of Systems:  As stated in the HPI and otherwise negative.   BP 129/78  Pulse 79  Ht 5' 5.5" (1.664 m)  Wt 176 lb 6.4 oz (80.015 kg)  BMI 28.91 kg/m2  Physical Examination: General: Well developed, well nourished, NAD HEENT: OP clear, mucus membranes moist SKIN: warm, dry. No rashes. Neuro: No focal deficits Musculoskeletal: Muscle strength 5/5 all ext Psychiatric: Mood and affect normal Neck: No JVD, no carotid bruits, no thyromegaly, no lymphadenopathy. Lungs:Clear bilaterally, no wheezes, rhonci, crackles Cardiovascular: Regular rate and rhythm. No murmurs, gallops or rubs. Abdomen:Soft. Bowel sounds present. Non-tender.  Extremities: No lower extremity edema. Pulses are 2 + in the bilateral DP/PT.  ZOX:WRUEAV sinus rhythm, rate 72 bpm. Normal EKG.

## 2011-07-07 NOTE — Assessment & Plan Note (Signed)
His EKG today is normal. The EKG from the outside office had a small Q-wave isolated in lead III, no clinical significance. He has no chest pain, SOB or palpitations. No further cardiac workup needed. He can proceed to his planned surgery without additional cardiac testing.

## 2011-07-07 NOTE — Telephone Encounter (Signed)
Reviewed. Will d/w pt today. cdm

## 2011-07-15 LAB — T-HELPER CELL (CD4) - (RCID CLINIC ONLY): CD4 T Cell Abs: 410

## 2011-07-21 ENCOUNTER — Encounter: Payer: Self-pay | Admitting: Cardiovascular Disease

## 2012-10-13 ENCOUNTER — Ambulatory Visit (INDEPENDENT_AMBULATORY_CARE_PROVIDER_SITE_OTHER): Payer: Managed Care, Other (non HMO) | Admitting: Sports Medicine

## 2012-10-13 VITALS — BP 136/70 | Ht 65.0 in | Wt 170.0 lb

## 2012-10-13 DIAGNOSIS — M79609 Pain in unspecified limb: Secondary | ICD-10-CM

## 2012-10-13 DIAGNOSIS — M6740 Ganglion, unspecified site: Secondary | ICD-10-CM

## 2012-10-13 DIAGNOSIS — M79673 Pain in unspecified foot: Secondary | ICD-10-CM

## 2012-10-13 NOTE — Progress Notes (Signed)
  Subjective:    Patient ID: Michael [name], male    DOB: 1955-10-23, 57 y.o.   MRN: [id number]  HPI chief complaint: Left foot pain  57 year old male comes in today complaining of 2 weeks of dorsal left foot pain. He describes an aching discomfort along the lateral dorsal midfoot region. He has noticed some swelling in this area. He denies any trauma. His pain is constantly present but worse with direct pressure over the area such as wearing tight shoes. He denies similar problems in the past but does have a history of plantar fasciitis which was treated by Dr. Darrick Penna with some orthotics. He was recently seen at [facility] an x-ray of his foot was ordered by Dr. Farris Has. It is negative per the patient's report. Dr. Farris Has had recommended an MRI scan of his left foot but the patient would like to proceed with a less expensive ultrasound if possible. He is getting some associated numbness at the tip of his fourth and fifth toes but denies numbness elsewhere in the foot. No plantar foot pain. No fevers or chills.  Past medical history and current medications are reviewed. Past medical history is significant for HIV, hepatitis B, reflux disease Surgical history is significant for bilateral rotator cuff repairs done by Dr. Eulah Pont     Review of Systems     Objective:   Physical Exam Well-developed, well-nourished. No acute distress. Vital signs are reviewed  Left foot: There is a tiny palpable cystic area in the lateral portion of the midfoot dorsally. It is tender to palpation. Not erythematous. No pain with metatarsal squeeze. Extensor tendons are intact. Neurovascularly intact distally. Walking with a noticeable limp.  MSK ultrasound of the left foot: Images were obtained in long and short views. There is an obvious ganglion cyst in the lateral, dorsal midfoot region. It appears to originate from the underlying joint space. Extensor tendons are within normal limits, that the cyst  does apply pressure against the extensor tendon that leads to the fourth toe. Tendon moves independently of the ganglion cyst with dynamic flexion and extension of the toes. The underlying fourth metatarsal appears unremarkable with no evidence of stress fracture.      Assessment & Plan:  1. Left foot pain secondary to dorsal ganglion cyst  We discussed the treatment options with the patient today including a cortisone injection. Patient agreed to cortisone injection. Consent was obtained and risks of injection were explained including risk of infection and hypopigmentation. Under sterile technique and under ultrasound guidance, 0.2 cc of 10 mg Kenalog and 0.1 cc of Xylocaine were injected directly into the cyst. Patient tolerated the procedure without difficulty. An arch strap and doughnut pad for compression to be worn daily until symptoms improve. Patient will avoid constricting shoe wear. Upon questioning the patient a little further, he revealed to me that he started doing some sprinting on his treadmill a couple weeks before his pain began. This is likely a major contributor to his symptoms. I recommended avoiding running until pain improves. I've also recommended that he resume wearing his custom orthotics which were constructed for him by Dr. Darrick Penna. Patient will followup for ongoing or recalcitrant issues.

## 2012-10-14 ENCOUNTER — Other Ambulatory Visit: Payer: Managed Care, Other (non HMO) | Admitting: Sports Medicine

## 2013-02-07 ENCOUNTER — Other Ambulatory Visit: Payer: Self-pay | Admitting: *Deleted

## 2013-02-07 DIAGNOSIS — I83893 Varicose veins of bilateral lower extremities with other complications: Secondary | ICD-10-CM

## 2013-02-17 ENCOUNTER — Ambulatory Visit: Payer: Managed Care, Other (non HMO) | Admitting: Physician Assistant

## 2013-02-24 ENCOUNTER — Ambulatory Visit (INDEPENDENT_AMBULATORY_CARE_PROVIDER_SITE_OTHER): Payer: Managed Care, Other (non HMO) | Admitting: Sports Medicine

## 2013-02-24 ENCOUNTER — Encounter: Payer: Self-pay | Admitting: Sports Medicine

## 2013-02-24 VITALS — BP 132/82 | HR 71 | Ht 65.0 in | Wt 170.0 lb

## 2013-02-24 DIAGNOSIS — I83813 Varicose veins of bilateral lower extremities with pain: Secondary | ICD-10-CM

## 2013-02-24 DIAGNOSIS — I83893 Varicose veins of bilateral lower extremities with other complications: Secondary | ICD-10-CM

## 2013-02-24 DIAGNOSIS — M25552 Pain in left hip: Secondary | ICD-10-CM

## 2013-02-24 DIAGNOSIS — M25569 Pain in unspecified knee: Secondary | ICD-10-CM

## 2013-02-24 DIAGNOSIS — M25559 Pain in unspecified hip: Secondary | ICD-10-CM

## 2013-02-24 MED ORDER — NITROGLYCERIN 0.2 MG/HR TD PT24: MEDICATED_PATCH | TRANSDERMAL | Status: DC

## 2013-02-24 NOTE — Patient Instructions (Addendum)
Nitroglycerin Protocol   Apply 1/4 nitroglycerin patch to affected area daily.  Change position of patch within the affected area every 24 hours.  You may experience a headache during the first 1-2 weeks of using the patch, these should subside.  If you experience headaches after beginning nitroglycerin patch treatment, you may take your preferred over the counter pain reliever.  Another side effect of the nitroglycerin patch is skin irritation or rash related to patch adhesive.  Please notify our office if you develop more severe headaches or rash, and stop the patch.  Tendon healing with nitroglycerin patch may require 12 to 24 weeks depending on the extent of injury.  Men should not use if taking Viagra, Cialis, or Levitra.   Do not use if you have migraines or rosacea.   Please do suggested exercises daily  Ice painful area twice daily  Try calf sleeves with running and a lot of standing/walking and as needed  Please follow up 4 weeks  Thank you for seeing [place] today!

## 2013-02-24 NOTE — Progress Notes (Signed)
  Subjective:    Patient ID: Michael [name], male    DOB: Feb 27, 1956, 57 y.o.   MRN: [id number]  HPI  Pt presents to clinic for evaluation of lt hip pain that started about 3-4 months ago. Pain is located over lateral hip and into the posterior aspect.  Started after interval training on treadmill. Thought it was muscular initially- had an intense massage that did not alleviate pain. Takes advil as needed at bedtime.  Also concerned about varicose veins that he has in lower legs. States when he runs the varicose veins bulge, pulsate, and become painful.  Has to stop running due to pain.  His father had significant problems with varicose veins   Review of Systems     Objective:   Physical Exam No acute distress  Hip ROM good bilat FABER good bilat  Hip flexion strength good on rt Hip abduction strong on rt Gluteus medius testing not painful TFL testing pain on rt Rt SI joint moves freely  Along the insertion of the gluteus medius and the tensor fascia lata to the iliac crest there is palpable tenderness and it gets more severe just past the midline posteriorly        Assessment & Plan:

## 2013-02-24 NOTE — Assessment & Plan Note (Signed)
Musculoskeletal ultrasound The iliac crest is scanned from the anterior to the posterior border of the left There is some small splitting of the fascia at the tensor fascia lata Just past the midline at the insertion of the gluteus medius into the iliac crests is a large hypoechoic area Michael [name] does not have much increase in Doppler flow Muscle and tendons do not appear torn but do have hypoechoic change  Use ice  Nitroglycerin protocol Gentle rehabilitation program  Recheck 4-6 weeks and substitute biking for running until healing enough to have limited pain

## 2013-02-24 NOTE — Assessment & Plan Note (Signed)
I gave him compression sleeves to try on ankles  And lower calves use for standing or running  Recheck if symptoms worsen

## 2013-03-28 ENCOUNTER — Encounter: Payer: Managed Care, Other (non HMO) | Admitting: Vascular Surgery

## 2013-03-29 ENCOUNTER — Ambulatory Visit (INDEPENDENT_AMBULATORY_CARE_PROVIDER_SITE_OTHER): Payer: Managed Care, Other (non HMO) | Admitting: Sports Medicine

## 2013-03-29 VITALS — BP 123/76 | Ht 65.0 in | Wt 170.0 lb

## 2013-03-29 DIAGNOSIS — M25559 Pain in unspecified hip: Secondary | ICD-10-CM

## 2013-03-29 DIAGNOSIS — M79609 Pain in unspecified limb: Secondary | ICD-10-CM

## 2013-03-29 DIAGNOSIS — M25552 Pain in left hip: Secondary | ICD-10-CM

## 2013-03-29 NOTE — Progress Notes (Signed)
Patient ID: Michael [name], male   DOB: August 13, 1956, 57 y.o.   MRN: [id number]  Patient returns for follow up of left hip Pain assoicated with fluid pocket at TFL and Glut med intersection Since last visit has done most of exercises Improved 20 to 30% in pain Pain at original spot almost gone but some newer pain more post along iliac crest Worst pain after too much sitting  Ganglion on left foot is no longer painful However, recently noted hard knot in same area  PEXAM  NAD LT HIP Good strength over TFL testing with only mild pain Glut medius strong and no pain Glut max some mild pain with testing TTP just above lat post iliac crest  [place] There is resolution of swelling at intersection of TFL and the G Med Glut Med and Max look normal at insertion to iliac crest Hypoechoic change in oblique just above insertion of G med  Hard non movable nodule on TMT joint 4/5 left foot  [place] of this shows a hypoechoic nodule with stalk into TMT joint Consistent with ganglion

## 2013-03-29 NOTE — Assessment & Plan Note (Signed)
I think he has resolved most of pain in lateral hip MM Now has some in Oblique MM on superior aspect of iliac crest  Keep up some of hip exercises Add some trunk exercises to work oblique  Try half patch NTG over the area of injury  Reck 6 wks

## 2013-03-29 NOTE — Assessment & Plan Note (Signed)
Nodule is consistent with ganglion of TMT at 4/5 Ray intersection  Not painful so I would follow

## 2013-04-12 ENCOUNTER — Ambulatory Visit (INDEPENDENT_AMBULATORY_CARE_PROVIDER_SITE_OTHER): Payer: Managed Care, Other (non HMO) | Admitting: Sports Medicine

## 2013-04-12 ENCOUNTER — Encounter: Payer: Self-pay | Admitting: Sports Medicine

## 2013-04-12 VITALS — BP 126/84 | HR 79 | Ht 65.0 in | Wt 170.0 lb

## 2013-04-12 DIAGNOSIS — M533 Sacrococcygeal disorders, not elsewhere classified: Secondary | ICD-10-CM

## 2013-04-12 MED ORDER — KETOROLAC TROMETHAMINE 60 MG/2ML IM SOLN
60.0000 mg | Freq: Once | INTRAMUSCULAR | Status: AC
  Administered 2013-04-12: 60 mg via INTRAMUSCULAR

## 2013-04-12 MED ORDER — CYCLOBENZAPRINE HCL 10 MG PO TABS: 10.0000 mg | ORAL_TABLET | Freq: Every evening | ORAL | Status: DC | PRN

## 2013-04-12 MED ORDER — HYDROCODONE-ACETAMINOPHEN 5-325 MG PO TABS: 1.0000 | ORAL_TABLET | Freq: Four times a day (QID) | ORAL | Status: DC | PRN

## 2013-04-12 NOTE — Progress Notes (Signed)
  Subjective:    Patient ID: Michael [name], male    DOB: 09-25-1956, 57 y.o.   MRN: [id number]  HPI  Pt presents to clinic for evaluation of L lower hip pain into glut x 2 days. Fell over the weekend on back - lost balance while turning off a lamp. Fell backwards landing on Left SIJ and buttocks Twisting while falling  Since then pain is more post over this area Pain over lat hip is actually less than before  Uncomfortable to sit or walk    Review of Systems     Objective:   Physical Exam Seems to be in moderate pain and uncomfortable moving or sitting  Sore over lt SI joint Able to do toe walking Hip rotation good bilat Neg straight leg raise on lt Sore over lt SI joint with raising leg 2+ reflex on lt 3+ reflex on rt 1+ ankle jerk bilat Any resisted extension of quad or hip flexor causes pain on lt Back extension to 30 degrees causes pain on lt SI joint       Assessment & Plan:

## 2013-04-12 NOTE — Assessment & Plan Note (Signed)
Use pain meds  IM toradol today  Easy walking exercises only  Reck if not improving in 5 days

## 2013-04-22 ENCOUNTER — Ambulatory Visit
Admission: RE | Admit: 2013-04-22 | Discharge: 2013-04-22 | Disposition: A | Discharge status: Home / Self Care | Payer: Managed Care, Other (non HMO) | Source: Ambulatory Visit | Attending: Sports Medicine | Admitting: Sports Medicine

## 2013-04-22 ENCOUNTER — Encounter: Payer: Self-pay | Admitting: Sports Medicine

## 2013-04-22 ENCOUNTER — Ambulatory Visit (INDEPENDENT_AMBULATORY_CARE_PROVIDER_SITE_OTHER): Payer: Managed Care, Other (non HMO) | Admitting: Sports Medicine

## 2013-04-22 VITALS — Ht 65.0 in | Wt 170.0 lb

## 2013-04-22 DIAGNOSIS — M533 Sacrococcygeal disorders, not elsewhere classified: Secondary | ICD-10-CM

## 2013-04-22 DIAGNOSIS — M25569 Pain in unspecified knee: Secondary | ICD-10-CM

## 2013-04-22 MED ORDER — PREDNISONE 10 MG PO TABS: ORAL_TABLET | ORAL | Status: DC

## 2013-04-23 NOTE — Progress Notes (Signed)
  Subjective:    Patient ID: Michael [name], male    DOB: 1956-05-20, 57 y.o.   MRN: [id number]  HPI Patient comes in today with persistent left-sided low back pain. He suffered a fall a little less than 2 weeks ago. He saw Dr. Darrick Penna a couple days after that fall. He was administered an IM Toradol injection and admits that his pain did improve but has not resolved. He localizes pain to the area of the left SI joint with some radiating discomfort down the posterior aspect of his left upper leg but he denies any radiating pain past the knee. No associated numbness or tingling. No groin pain. His symptoms are noticeable both with sitting and as well as with activity. He's been taking an occasional hydrocodone which has been helpful.    Review of Systems As above     Objective:   Physical Exam Well-developed, well-nourished. No acute distress. Awake alert and oriented x3  Lumbar spine: There is no tenderness to palpation or percussion along the lumbar midline. There is no appreciable paraspinal musculature spasm. There is tenderness to palpation over the left SI joint as well as along the left sacrum. No noticeable soft tissue swelling. No tenderness at the initial tuberosity. Examination of the left hip shows a smooth painless hip range of motion with a negative log roll in the sitting position. Neurological exam shows a negative straight leg raise bilaterally. No focal neurological deficits of either lower extremity are appreciated. He's walking without significant limp.  X-rays of the lumbar spine as well as an AP and lateral view of the sacrum are reviewed. No obvious fracture is seen.      Assessment & Plan:  1. Persistent left-sided low back pain/SI joint pain status post fall  6 day Sterapred Dosepak to take as directed. Continue with hydrocodone for more severe pain when necessary. Gentle stretching. Patient is leaving to go out of town for the next few days. If his symptoms linger I've  recommended that we consider referral to [facility] for some physical therapy. He has worked with Jonny Ruiz in the past. Otherwise, followup when necessary.

## 2013-04-28 ENCOUNTER — Ambulatory Visit (INDEPENDENT_AMBULATORY_CARE_PROVIDER_SITE_OTHER): Payer: Managed Care, Other (non HMO) | Admitting: Sports Medicine

## 2013-04-28 VITALS — BP 143/82

## 2013-04-28 DIAGNOSIS — M545 Low back pain: Secondary | ICD-10-CM

## 2013-04-28 MED ORDER — NAPROXEN 500 MG PO TABS: ORAL_TABLET | ORAL | Status: DC

## 2013-04-29 NOTE — Progress Notes (Signed)
  Subjective:    Patient ID: Michael [name], male    DOB: [birth year], 57 y.o.   MRN: [id number]  HPI Patient comes in today for followup. Left-sided low back pain is improved on oral prednisone but has now returned. It is changed a bit in nature in that it now is radiating down the left leg into the left calf at times. His pain is most noticeable with sitting. X-rays of his lumbar spine and sacrum show no obvious fracture nor significant degenerative changes. We have discussed the possibility of physical therapy at his last office visit.    Review of Systems     Objective:   Physical Exam Well-developed, well-nourished. No acute distress. Lumbar spine: Patient has pain both with forward flexion and extension. No tenderness along the lumbar midline but they're still some tenderness over the left SI joint. Positive straight leg raise on the left, negative on the right. There is some mild weakness with resisted plantarflexion on the left when compared to the right. Otherwise, strength is 5/5 bilaterally. Reflexes are brisk and equal at the Achilles and patellar tendons bilaterally. Walking without a limp.       Assessment & Plan:  1. Persistent left-sided low back pain now with radiculopathy  I'm concerned that this patient may in fact be dealing with some discogenic pain instead of SI joint pain. I will put him on Naprosyn 500 mg twice a day with food for 7 days then when necessary. He will start physical therapy with Michael [name] and will followup with me in 3 weeks. If symptoms persist or worsen out consider merits of further diagnostic imaging of the lumbar spine to rule out lumbar disc herniation. He will call me with questions or concerns prior to his followup visit.

## 2013-05-10 ENCOUNTER — Ambulatory Visit: Payer: Managed Care, Other (non HMO) | Admitting: Sports Medicine

## 2013-05-19 ENCOUNTER — Encounter: Payer: Self-pay | Admitting: Sports Medicine

## 2013-05-19 ENCOUNTER — Ambulatory Visit (INDEPENDENT_AMBULATORY_CARE_PROVIDER_SITE_OTHER): Payer: Managed Care, Other (non HMO) | Admitting: Sports Medicine

## 2013-05-19 VITALS — BP 125/83 | HR 56 | Ht 65.0 in | Wt 170.0 lb

## 2013-05-19 DIAGNOSIS — M545 Low back pain: Secondary | ICD-10-CM

## 2013-05-19 DIAGNOSIS — M5416 Radiculopathy, lumbar region: Secondary | ICD-10-CM

## 2013-05-19 DIAGNOSIS — IMO0002 Reserved for concepts with insufficient information to code with codable children: Secondary | ICD-10-CM

## 2013-05-19 NOTE — Patient Instructions (Addendum)
You have been scheduled for a MRI on 05/21/13 @ 12:30 pm [facility] [address] [place] [phone]

## 2013-05-20 ENCOUNTER — Encounter: Payer: Self-pay | Admitting: *Deleted

## 2013-05-20 NOTE — Telephone Encounter (Signed)
This encounter was created in error - please disregard.

## 2013-05-20 NOTE — Progress Notes (Signed)
  Subjective:    Patient ID: Michael [name], male    DOB: January 05, 1956, 57 y.o.   MRN: [id number]  HPI Patient comes in today for followup on low back pain. He is now beginning to experience more in the way of numbness in his left calf and into his foot. He has been to several physical therapy sessions and feels like they have been helpful but certainly not curative. Continues to localize his pain to the left side of his lower lumbar spine. Naprosyn has been helpful. He is also noticing some weakness in his left leg. He is growing frustrated with his symptoms.   Review of Systems     Objective:   Physical Exam  well-developed, well-nourished. No acute distress. Awake alert and oriented x3  Lumbar spine: Full lumbar mobility. Pain with both flexion and extension. Still tenderness to palpation of the left SI joint as well as in the left sciatic notch. No tenderness over the sacrum or over the lumbar midline. Positive straight leg raise on the left, negative on the right. Reflexes are equal at the Achilles and patellar tendons bilaterally. 4/5 strength with plantar flexion on the left compared to the right. He has some weakness with ambulating on his tiptoes as well. No atrophy.       Assessment & Plan:  1. Persistent low back pain with radiculopathy status post fall  Given his persistent symptoms and today's clinical exam I'm going to proceed with an MRI scan of his lumbar spine to rule out a lumbar disc herniation. I want him to continue in physical therapy and continue with when necessary Naprosyn. I will call him with results of the MRI once available at which point we'll delineate further treatment.

## 2013-05-21 ENCOUNTER — Ambulatory Visit
Admission: RE | Admit: 2013-05-21 | Discharge: 2013-05-21 | Disposition: A | Discharge status: Home / Self Care | Payer: Managed Care, Other (non HMO) | Source: Ambulatory Visit | Attending: Sports Medicine | Admitting: Sports Medicine

## 2013-05-21 DIAGNOSIS — M545 Low back pain: Secondary | ICD-10-CM

## 2013-05-23 ENCOUNTER — Telehealth: Payer: Self-pay | Admitting: Sports Medicine

## 2013-05-23 NOTE — Telephone Encounter (Signed)
I spoke with the patient on the phone today regarding MRI findings of his lumbar spine. No evidence of lumbar disc herniation. There is some minor bulging of the disc at L4-L5 and L5-S1. Some mild facet arthropathy at L5-S1 as well. He is reassured these findings. He will continue working with physical therapy until he is ready to wean to a home exercise program. He will followup with me if symptoms persist or worsen.

## 2013-05-31 ENCOUNTER — Encounter: Payer: Managed Care, Other (non HMO) | Admitting: Vascular Surgery

## 2013-11-01 NOTE — Telephone Encounter (Signed)
This encounter was created in error - please disregard.

## 2013-12-04 DIAGNOSIS — I2699 Other pulmonary embolism without acute cor pulmonale: Secondary | ICD-10-CM

## 2013-12-04 HISTORY — DX: Other pulmonary embolism without acute cor pulmonale: I26.99

## 2013-12-13 ENCOUNTER — Other Ambulatory Visit: Payer: Self-pay | Admitting: Physician Assistant

## 2013-12-13 ENCOUNTER — Encounter (HOSPITAL_BASED_OUTPATIENT_CLINIC_OR_DEPARTMENT_OTHER): Payer: Self-pay | Admitting: *Deleted

## 2013-12-13 NOTE — Progress Notes (Signed)
To come in for [id number] had 3 shoulders done here 042

## 2013-12-14 ENCOUNTER — Encounter (HOSPITAL_BASED_OUTPATIENT_CLINIC_OR_DEPARTMENT_OTHER)
Admission: RE | Admit: 2013-12-14 | Discharge: 2013-12-14 | Disposition: A | Discharge status: Home / Self Care | Payer: Managed Care, Other (non HMO) | Source: Ambulatory Visit | Attending: Orthopedic Surgery | Admitting: Orthopedic Surgery

## 2013-12-14 LAB — BASIC METABOLIC PANEL
BUN: 20 mg/dL (ref 6–23)
CO2: 25 mEq/L (ref 19–32)
Calcium: 9.4 mg/dL (ref 8.4–10.5)
Chloride: 100 mEq/L (ref 96–112)
Creatinine, Ser: 1.05 mg/dL (ref 0.50–1.35)
GFR calc Af Amer: 88 mL/min — ABNORMAL LOW (ref >90)
GFR, EST NON AFRICAN AMERICAN: 76 mL/min — AB (ref >90)
GLUCOSE: 94 mg/dL (ref 70–99)
POTASSIUM: 3.7 meq/L (ref 3.7–5.3)
Sodium: 139 mEq/L (ref 137–147)

## 2013-12-14 NOTE — Progress Notes (Signed)
Dr Al Corpus reviewed EKG and previous EKG's and cardiology notes from 07/2011. Pt states no changes since 2012 denies chest pain or shortness of breath. Per Dr. Al Corpus ok to continue with sx.

## 2013-12-14 NOTE — H&P (Signed)
[facility] [address] [place], [place] [address] [phone] A Division of [facility]  Ninetta Lights, M.D.   Rain A. Noemi Chapel, M.D.   Faythe Casa, M.D.   Johnny Bridge, M.D.   Almedia Balls, M.D Ernesta Amble. Percell Miller, M.D.  Joseph Pierini, M.D.  Lanier Prude, M.D.    Verner Chol, M.D. Mary L. Fenton Malling, PA-C  Kirstin A. Shepperson, PA-C  Josh [place], PA-C [place], [place]   RE: Michael, [name]                                [id number]      DOB: June 29, 1956 PROGRESS NOTE: 11-01-13 Michael [name] is an old patient of mine who comes in with a new issue with his right shoulder.  He was catching a falling Christmas tree and felt something pull and hurt in his shoulder.  This was in December.  He has had persistent symptoms since.  Some referred pain up to his neck and some down his arm.  Use of his shoulder definitely worsens things.  He has given this time, rest and anti-inflammatory medication without improvement.   Previous history includes operative intervention of his right shoulder by me in 2007.  Decompression, acromioplasty and distal clavicle excision with great results until this recent event.  At that time there was some roughening on the top of the cuff, but no real significant structural tears that warranted repair.  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  He has done well with that.  In talking with him at length I think this is more his shoulder than radicular from his neck.   Remaining history reviewed, updated and included in the chart.    EXAMINATION: General exam is outlined and included in the chart.  Specifically, healthy appearing 58 year-old male.  Neurovascularly intact in the upper and lower extremities.  Relatively good cervical motion.  A little spasm going up in that direction.  Right shoulder I can get him through full  motion, but there is positive impingement, positive palms down abduction and it feels like he has some swelling in the subacromial bursal area.  His cuff is intact, but painful.  Biceps intact.  No apprehension or instability.    X-RAYS: X-rays of his cervical spine show a good fusion at C5-6.  There is a little anterior ossification at C4-5 and C6-7, but those disc spaces are still well maintained.  X-rays of his shoulder show a Type I acromion.  Distal clavicle excision.  No superior migration.  No calcification.  No degenerative change.    DISPOSITION:  I think this is hopefully more his shoulder than his neck.  We are going to try a diagnostic/therapeutic subacromial injection.  Depending on results we have talked about a Jobe exercise program, as well as MRI of his shoulder to look at his cuff.  He understands and will let me know.    PROCEDURE NOTE: The patient's clinical condition is marked by substantial pain and/or significant functional disability.  Other conservative therapy has not provided relief, is contraindicated, or not appropriate.  There is a reasonable likelihood that injection will significantly improve the patient's pain and/or functional disability. After appropriate consent and under sterile technique subacromial injection of the right shoulder with Depo-Medrol/Marcaine.  Tolerated this well.  I  had him stay for about ten minutes after injection and I think there is definitely improvement, but not complete resolution of the symptoms with Marcaine in place.  I went over a Jobe exercise program with him.  He will let me know.  If we get good results with Marcaine, but he fails to stay improved he will call and I will get an MRI of his shoulder to look at his cuff.  He has not had a previous repair so I do not think we need an arthrogram MRI.  Ninetta Lights, M.D. Electronically verified by Ninetta Lights, M.D.  [name] [address] [place], [place] [address] [phone] A Division of [facility]  Ninetta Lights, M.D.   Rickey A. Noemi Chapel, M.D.   Faythe Casa, M.D.   Johnny Bridge, M.D.   Almedia Balls, M.D Ernesta Amble. Percell Miller, M.D.  Joseph Pierini, M.D.  Lanier Prude, M.D.    Verner Chol, M.D. Mary L. Fenton Malling, PA-C  Kirstin A. Shepperson, PA-C  Josh [place], PA-C [place], [place]   RE: Michael, [name]                                [phone]      DOB: 1956/07/23 PROGRESS NOTE: 12-13-13 Michael [name] comes in for follow up.  Persistent right shoulder symptoms.  Injection was helpful with Marcaine in place, but once that wore off no longer helpful.  He continues to have significant subacromial symptoms.  Really not describing anything that sounds like radicular symptoms from his neck and I would emphasize that at least with Marcaine in place he is definitely better.  Previous operative intervention in 2007.  Follow up recent MRI scan showing that what we addressed there looked good and there was no new cuff tear.  Clinically he is having persistent bursitis and posttraumatic irritation.  He feels like he is getting worse rather than better.  I have reviewed previous operative intervention with him.  We went over his recent exam and his recent MRI, as well as his response to injection.  At this point in time he would like to do something definitive.  I remain at this point in time relatively convinced that his symptoms are coming from his shoulder.  We have discussed definitive treatment; exam under anesthesia, arthroscopy, debridement glenohumeral joint and subacromial space and revision acromioplasty as indicated.  I have been trying to hold on doing this, but his symptoms are forcing our hand.  Procedure, risks, benefits and complications reviewed.  Anticipated recovery and rehab outlined.  More than 25 minutes spent face-to-face covering this with him.  I will see him at the time of  operative intervention.    Ninetta Lights, M.D.

## 2013-12-15 ENCOUNTER — Ambulatory Visit (HOSPITAL_BASED_OUTPATIENT_CLINIC_OR_DEPARTMENT_OTHER)
Admission: RE | Admit: 2013-12-15 | Discharge: 2013-12-15 | Disposition: A | Discharge status: Home / Self Care | Payer: Managed Care, Other (non HMO) | Source: Ambulatory Visit | Attending: Orthopedic Surgery | Admitting: Orthopedic Surgery

## 2013-12-15 ENCOUNTER — Encounter (HOSPITAL_BASED_OUTPATIENT_CLINIC_OR_DEPARTMENT_OTHER): Payer: Self-pay

## 2013-12-15 ENCOUNTER — Encounter (HOSPITAL_BASED_OUTPATIENT_CLINIC_OR_DEPARTMENT_OTHER): Payer: Managed Care, Other (non HMO) | Admitting: Anesthesiology

## 2013-12-15 ENCOUNTER — Ambulatory Visit (HOSPITAL_BASED_OUTPATIENT_CLINIC_OR_DEPARTMENT_OTHER): Payer: Managed Care, Other (non HMO) | Admitting: Anesthesiology

## 2013-12-15 ENCOUNTER — Encounter (HOSPITAL_BASED_OUTPATIENT_CLINIC_OR_DEPARTMENT_OTHER)
Admission: RE | Disposition: A | Discharge status: Home / Self Care | Payer: Self-pay | Source: Ambulatory Visit | Attending: Orthopedic Surgery

## 2013-12-15 DIAGNOSIS — M25819 Other specified joint disorders, unspecified shoulder: Secondary | ICD-10-CM | POA: Insufficient documentation

## 2013-12-15 DIAGNOSIS — IMO0002 Reserved for concepts with insufficient information to code with codable children: Secondary | ICD-10-CM | POA: Insufficient documentation

## 2013-12-15 DIAGNOSIS — Z01810 Encounter for preprocedural cardiovascular examination: Secondary | ICD-10-CM | POA: Insufficient documentation

## 2013-12-15 DIAGNOSIS — F329 Major depressive disorder, single episode, unspecified: Secondary | ICD-10-CM | POA: Insufficient documentation

## 2013-12-15 DIAGNOSIS — M7541 Impingement syndrome of right shoulder: Secondary | ICD-10-CM

## 2013-12-15 DIAGNOSIS — Z981 Arthrodesis status: Secondary | ICD-10-CM | POA: Insufficient documentation

## 2013-12-15 DIAGNOSIS — F3289 Other specified depressive episodes: Secondary | ICD-10-CM | POA: Insufficient documentation

## 2013-12-15 DIAGNOSIS — B1910 Unspecified viral hepatitis B without hepatic coma: Secondary | ICD-10-CM | POA: Insufficient documentation

## 2013-12-15 DIAGNOSIS — Z01812 Encounter for preprocedural laboratory examination: Secondary | ICD-10-CM | POA: Insufficient documentation

## 2013-12-15 DIAGNOSIS — M75100 Unspecified rotator cuff tear or rupture of unspecified shoulder, not specified as traumatic: Secondary | ICD-10-CM | POA: Insufficient documentation

## 2013-12-15 DIAGNOSIS — K219 Gastro-esophageal reflux disease without esophagitis: Secondary | ICD-10-CM | POA: Insufficient documentation

## 2013-12-15 DIAGNOSIS — M24119 Other articular cartilage disorders, unspecified shoulder: Secondary | ICD-10-CM | POA: Insufficient documentation

## 2013-12-15 DIAGNOSIS — I10 Essential (primary) hypertension: Secondary | ICD-10-CM | POA: Insufficient documentation

## 2013-12-15 DIAGNOSIS — J45909 Unspecified asthma, uncomplicated: Secondary | ICD-10-CM | POA: Insufficient documentation

## 2013-12-15 DIAGNOSIS — M7580 Other shoulder lesions, unspecified shoulder: Principal | ICD-10-CM

## 2013-12-15 HISTORY — PX: SHOULDER ARTHROSCOPY WITH SUBACROMIAL DECOMPRESSION: SHX5684

## 2013-12-15 LAB — POCT HEMOGLOBIN-HEMACUE: HEMOGLOBIN: 17.1 g/dL — AB (ref 13.0–17.0)

## 2013-12-15 SURGERY — SHOULDER ARTHROSCOPY WITH SUBACROMIAL DECOMPRESSION
Anesthesia: General | Site: Shoulder | Laterality: Right

## 2013-12-15 MED ORDER — DEXAMETHASONE SODIUM PHOSPHATE 4 MG/ML IJ SOLN
INTRAMUSCULAR | Status: DC | PRN
  Administered 2013-12-15: 10 mg via INTRAVENOUS

## 2013-12-15 MED ORDER — MIDAZOLAM HCL 2 MG/2ML IJ SOLN
1.0000 mg | INTRAMUSCULAR | Status: DC | PRN
  Administered 2013-12-15: 2 mg via INTRAVENOUS

## 2013-12-15 MED ORDER — FENTANYL CITRATE 0.05 MG/ML IJ SOLN
INTRAMUSCULAR | Status: AC
  Filled 2013-12-15: qty 2

## 2013-12-15 MED ORDER — ONDANSETRON HCL 4 MG/2ML IJ SOLN
INTRAMUSCULAR | Status: DC | PRN
  Administered 2013-12-15: 4 mg via INTRAVENOUS

## 2013-12-15 MED ORDER — DEXAMETHASONE SODIUM PHOSPHATE 10 MG/ML IJ SOLN
INTRAMUSCULAR | Status: DC | PRN
  Administered 2013-12-15: 6 mg

## 2013-12-15 MED ORDER — OXYCODONE HCL 5 MG PO TABS: 5.0000 mg | ORAL_TABLET | Freq: Once | ORAL | Status: DC | PRN

## 2013-12-15 MED ORDER — SUCCINYLCHOLINE CHLORIDE 20 MG/ML IJ SOLN
INTRAMUSCULAR | Status: DC | PRN
  Administered 2013-12-15: 100 mg via INTRAVENOUS

## 2013-12-15 MED ORDER — FENTANYL CITRATE 0.05 MG/ML IJ SOLN
INTRAMUSCULAR | Status: DC | PRN
  Administered 2013-12-15: 50 ug via INTRAVENOUS

## 2013-12-15 MED ORDER — LACTATED RINGERS IV SOLN
INTRAVENOUS | Status: DC
  Administered 2013-12-15: 08:00:00 via INTRAVENOUS

## 2013-12-15 MED ORDER — PROPOFOL 10 MG/ML IV BOLUS
INTRAVENOUS | Status: DC | PRN
  Administered 2013-12-15: 200 mg via INTRAVENOUS

## 2013-12-15 MED ORDER — OXYCODONE-ACETAMINOPHEN 5-325 MG PO TABS: 1.0000 | ORAL_TABLET | ORAL | Status: DC | PRN

## 2013-12-15 MED ORDER — LIDOCAINE HCL (CARDIAC) 20 MG/ML IV SOLN
INTRAVENOUS | Status: DC | PRN
  Administered 2013-12-15: 40 mg via INTRAVENOUS

## 2013-12-15 MED ORDER — LACTATED RINGERS IV SOLN
INTRAVENOUS | Status: DC
  Administered 2013-12-15: 09:00:00 via INTRAVENOUS

## 2013-12-15 MED ORDER — ROPIVACAINE HCL 5 MG/ML IJ SOLN
INTRAMUSCULAR | Status: DC | PRN
  Administered 2013-12-15: 150 mg via PERINEURAL

## 2013-12-15 MED ORDER — FENTANYL CITRATE 0.05 MG/ML IJ SOLN
50.0000 ug | INTRAMUSCULAR | Status: DC | PRN
  Administered 2013-12-15: 100 ug via INTRAVENOUS

## 2013-12-15 MED ORDER — PROMETHAZINE HCL 25 MG/ML IJ SOLN: 6.2500 mg | INTRAMUSCULAR | Status: DC | PRN

## 2013-12-15 MED ORDER — CEFAZOLIN SODIUM-DEXTROSE 2-3 GM-% IV SOLR
INTRAVENOUS | Status: AC
  Filled 2013-12-15: qty 50

## 2013-12-15 MED ORDER — ONDANSETRON HCL 4 MG PO TABS: 4.0000 mg | ORAL_TABLET | Freq: Three times a day (TID) | ORAL | Status: DC | PRN

## 2013-12-15 MED ORDER — OXYCODONE HCL 5 MG/5ML PO SOLN: 5.0000 mg | Freq: Once | ORAL | Status: DC | PRN

## 2013-12-15 MED ORDER — SODIUM CHLORIDE 0.9 % IR SOLN
Status: DC | PRN
  Administered 2013-12-15: 6000 mL

## 2013-12-15 MED ORDER — HYDROMORPHONE HCL PF 1 MG/ML IJ SOLN: 0.2500 mg | INTRAMUSCULAR | Status: DC | PRN

## 2013-12-15 MED ORDER — MIDAZOLAM HCL 2 MG/2ML IJ SOLN
INTRAMUSCULAR | Status: AC
  Filled 2013-12-15: qty 2

## 2013-12-15 MED ORDER — CHLORHEXIDINE GLUCONATE 4 % EX LIQD: 60.0000 mL | Freq: Once | CUTANEOUS | Status: DC

## 2013-12-15 MED ORDER — CEFAZOLIN SODIUM-DEXTROSE 2-3 GM-% IV SOLR
2.0000 g | INTRAVENOUS | Status: AC
  Administered 2013-12-15: 2 g via INTRAVENOUS

## 2013-12-15 SURGICAL SUPPLY — 76 items
BENZOIN TINCTURE PRP APPL 2/3 (GAUZE/BANDAGES/DRESSINGS) IMPLANT
BLADE CUTTER GATOR 3.5 (BLADE) ×3 IMPLANT
BLADE CUTTER MENIS 5.5 (BLADE) IMPLANT
BLADE GREAT WHITE 4.2 (BLADE) ×2 IMPLANT
BLADE GREAT WHITE 4.2MM (BLADE) ×1
BLADE SURG 15 STRL LF DISP TIS (BLADE) IMPLANT
BLADE SURG 15 STRL SS (BLADE)
BUR OVAL 6.0 (BURR) ×3 IMPLANT
CANISTER SUCT 3000ML (MISCELLANEOUS) IMPLANT
CANNULA DRY DOC 8X75 (CANNULA) IMPLANT
CANNULA TWIST IN 8.25X7CM (CANNULA) IMPLANT
CLOSURE WOUND 1/2 X4 (GAUZE/BANDAGES/DRESSINGS)
DECANTER SPIKE VIAL GLASS SM (MISCELLANEOUS) IMPLANT
DRAPE STERI 35X30 U-POUCH (DRAPES) ×3 IMPLANT
DRAPE U-SHAPE 47X51 STRL (DRAPES) ×3 IMPLANT
DRAPE U-SHAPE 76X120 STRL (DRAPES) ×6 IMPLANT
DURAPREP 26ML APPLICATOR (WOUND CARE) ×3 IMPLANT
ELECT MENISCUS 165MM 90D (ELECTRODE) ×3 IMPLANT
ELECT NEEDLE TIP 2.8 STRL (NEEDLE) IMPLANT
ELECT REM PT RETURN 9FT ADLT (ELECTROSURGICAL) ×3
ELECTRODE REM PT RTRN 9FT ADLT (ELECTROSURGICAL) ×1 IMPLANT
GAUZE XEROFORM 1X8 LF (GAUZE/BANDAGES/DRESSINGS) ×3 IMPLANT
GLOVE BIO SURGEON STRL SZ7 (GLOVE) ×3 IMPLANT
GLOVE BIOGEL PI IND STRL 7.0 (GLOVE) ×2 IMPLANT
GLOVE BIOGEL PI IND STRL 7.5 (GLOVE) IMPLANT
GLOVE BIOGEL PI IND STRL 8.5 (GLOVE) ×1 IMPLANT
GLOVE BIOGEL PI INDICATOR 7.0 (GLOVE) ×4
GLOVE BIOGEL PI INDICATOR 7.5 (GLOVE)
GLOVE BIOGEL PI INDICATOR 8.5 (GLOVE) ×2
GLOVE ECLIPSE 6.5 STRL STRAW (GLOVE) ×3 IMPLANT
GLOVE EXAM NITRILE LRG STRL (GLOVE) ×3 IMPLANT
GLOVE ORTHO TXT STRL SZ7.5 (GLOVE) ×3 IMPLANT
GOWN STRL REUS W/ TWL LRG LVL3 (GOWN DISPOSABLE) ×1 IMPLANT
GOWN STRL REUS W/ TWL XL LVL3 (GOWN DISPOSABLE) ×2 IMPLANT
GOWN STRL REUS W/TWL LRG LVL3 (GOWN DISPOSABLE) ×2
GOWN STRL REUS W/TWL XL LVL3 (GOWN DISPOSABLE) ×4
IV NS IRRIG 3000ML ARTHROMATIC (IV SOLUTION) ×6 IMPLANT
MANIFOLD NEPTUNE II (INSTRUMENTS) ×3 IMPLANT
NDL SUT 6 .5 CRC .975X.05 MAYO (NEEDLE) IMPLANT
NEEDLE MAYO TAPER (NEEDLE)
NEEDLE SCORPION MULTI FIRE (NEEDLE) IMPLANT
NS IRRIG 1000ML POUR BTL (IV SOLUTION) IMPLANT
PACK ARTHROSCOPY DSU (CUSTOM PROCEDURE TRAY) ×3 IMPLANT
PACK BASIN DAY SURGERY FS (CUSTOM PROCEDURE TRAY) ×3 IMPLANT
PAD ABD 8X10 STRL (GAUZE/BANDAGES/DRESSINGS) ×3 IMPLANT
PASSER SUT SWANSON 36MM LOOP (INSTRUMENTS) IMPLANT
PENCIL BUTTON HOLSTER BLD 10FT (ELECTRODE) ×3 IMPLANT
SET ARTHROSCOPY TUBING (MISCELLANEOUS) ×2
SET ARTHROSCOPY TUBING LN (MISCELLANEOUS) ×1 IMPLANT
SLEEVE SCD COMPRESS KNEE MED (MISCELLANEOUS) ×3 IMPLANT
SLING ARM IMMOBILIZER LRG (SOFTGOODS) IMPLANT
SLING ARM IMMOBILIZER MED (SOFTGOODS) IMPLANT
SLING ARM LRG ADULT FOAM STRAP (SOFTGOODS) ×3 IMPLANT
SLING ARM MED ADULT FOAM STRAP (SOFTGOODS) IMPLANT
SLING ARM XL FOAM STRAP (SOFTGOODS) IMPLANT
SPONGE GAUZE 4X4 12PLY (GAUZE/BANDAGES/DRESSINGS) ×6 IMPLANT
SPONGE LAP 4X18 X RAY DECT (DISPOSABLE) IMPLANT
STRIP CLOSURE SKIN 1/2X4 (GAUZE/BANDAGES/DRESSINGS) IMPLANT
SUCTION FRAZIER TIP 10 FR DISP (SUCTIONS) IMPLANT
SUT ETHIBOND 2 OS 4 DA (SUTURE) IMPLANT
SUT ETHILON 2 0 FS 18 (SUTURE) IMPLANT
SUT ETHILON 3 0 PS 1 (SUTURE) IMPLANT
SUT FIBERWIRE #2 38 T-5 BLUE (SUTURE)
SUT RETRIEVER MED (INSTRUMENTS) IMPLANT
SUT TIGER TAPE 7 IN WHITE (SUTURE) IMPLANT
SUT VIC AB 0 CT1 27 (SUTURE)
SUT VIC AB 0 CT1 27XBRD ANBCTR (SUTURE) IMPLANT
SUT VIC AB 2-0 SH 27 (SUTURE)
SUT VIC AB 2-0 SH 27XBRD (SUTURE) IMPLANT
SUT VIC AB 3-0 FS2 27 (SUTURE) IMPLANT
SUTURE FIBERWR #2 38 T-5 BLUE (SUTURE) IMPLANT
TAPE FIBER 2MM 7IN #2 BLUE (SUTURE) IMPLANT
TOWEL OR 17X24 6PK STRL BLUE (TOWEL DISPOSABLE) ×3 IMPLANT
TOWEL OR NON WOVEN STRL DISP B (DISPOSABLE) ×3 IMPLANT
WATER STERILE IRR 1000ML POUR (IV SOLUTION) ×3 IMPLANT
YANKAUER SUCT BULB TIP NO VENT (SUCTIONS) IMPLANT

## 2013-12-15 NOTE — Transfer of Care (Signed)
Immediate Anesthesia Transfer of Care Note  Patient: Michael [name]  Procedure(s) Performed: Procedure(s): RIGHT SHOULDER ARTHROSCOPY WITH EXTENSIVE DEBRIDEMENT OF ROTATOR CUFF,SUBACROMIAL DECOMPRESSION, DEBRIDMENT OF BURSA (Right)  Patient Location: PACU  Anesthesia Type:General  Level of Consciousness: awake and sedated  Airway & Oxygen Therapy: Patient Spontanous Breathing and Patient connected to face mask oxygen  Post-op Assessment: Report given to PACU RN and Post -op Vital signs reviewed and stable  Post vital signs: Reviewed and stable  Complications: No apparent anesthesia complications

## 2013-12-15 NOTE — Anesthesia Procedure Notes (Addendum)
Anesthesia Regional Block:  Interscalene brachial plexus block  Pre-Anesthetic Checklist: ,, timeout performed, Correct Patient, Correct Site, Correct Laterality, Correct Procedure, Correct Position, site marked, Risks and benefits discussed,  Surgical consent,  Pre-op evaluation,  At surgeon's request and post-op pain management  Laterality: Right  Prep: chloraprep       Needles:  Injection technique: Single-shot  Needle Type: Echogenic Stimulator Needle     Needle Length: 5cm 5 cm Needle Gauge: 22 and 22 G    Additional Needles:  Procedures: ultrasound guided (picture in chart) and nerve stimulator Interscalene brachial plexus block  Nerve Stimulator or Paresthesia:  Response: bicep contraction, 0.45 mA,   Additional Responses:   Narrative:  Start time: 12/15/2013 8:28 AM End time: 12/15/2013 8:38 AM Injection made incrementally with aspirations every 5 mL.  Performed by: Personally  Anesthesiologist: J. Tamela Gammon, MD  Additional Notes: Functioning IV was confirmed and monitors applied.  A 4mm 22ga echogenic arrow stimulator was used. Sterile prep and drape,hand hygiene and sterile gloves were used.Ultrasound guidance: relevant anatomy identified, needle position confirmed, local anesthetic spread visualized around nerve(s)., vascular puncture avoided.  Image printed for medical record.  Negative aspiration and negative test dose prior to incremental administration of local anesthetic. The patient tolerated the procedure well.   Procedure Name: Intubation Performed by: Terrance Mass Pre-anesthesia Checklist: Patient identified, Timeout performed, Emergency Drugs available, Suction available and Patient being monitored Patient Re-evaluated:Patient Re-evaluated prior to inductionOxygen Delivery Method: Circle system utilized Preoxygenation: Pre-oxygenation with 100% oxygen Intubation Type: IV induction Ventilation: Mask ventilation without difficulty Laryngoscope  Size: 2 Grade View: Grade I Tube type: Oral Tube size: 7.0 mm Number of attempts: 2 Airway Equipment and Method: Stylet Placement Confirmation: ETT inserted through vocal cords under direct vision,  breath sounds checked- equal and bilateral and positive ETCO2 Secured at: 22 cm Tube secured with: Tape Dental Injury: Teeth and Oropharynx as per pre-operative assessment

## 2013-12-15 NOTE — Discharge Instructions (Signed)
Shouder arthroscopy, partial rotator cuff tear debridement subacromial decompression Care After Instructions Refer to this sheet in the next few weeks. These discharge instructions provide you with general information on caring for yourself after you leave the hospital. Your caregiver may also give you specific instructions. Your treatment has been planned according to the most current medical practices available, but unavoidable complications sometimes occur. If you have any problems or questions after discharge, please call your caregiver. HOME INSTRUCTIONS You may resume a normal diet and activities as directed.  Take showers instead of baths until informed otherwise.  Change bandages (dressings) in 3 days.  Swab wounds daily with betadine.  Wash shoulder with soap and water.  Pat dry.  Cover wounds with bandaids. Only take over-the-counter or prescription medicines for pain, discomfort, or fever as directed by your caregiver.  Wear your sling for the next 2 days unless otherwise instructed. May apply ice for up to 20 minutes at a time for pain and swelling. Eat a well-balanced diet.  Avoid lifting or driving until you are instructed otherwise.  Make an appointment to see your caregiver for stitches (suture) or staple removal as directed.   SEEK MEDICAL CARE IF: You have swelling of your calf or leg.  You develop shortness of breath or chest pain.  You have redness, swelling, or increasing pain in the wound.  There is pus or any unusual drainage coming from the surgical site.  You notice a bad smell coming from the surgical site or dressing.  The surgical site breaks open after sutures or staples have been removed.  There is persistent bleeding from the suture or staple line.  You are getting worse or are not improving.  You have any other questions or concerns.  SEEK IMMEDIATE MEDICAL CARE IF:  You have a fever greater than 101 You develop a rash.  You have difficulty breathing.  You  develop any reaction or side effects to medicines given.  Your knee motion is decreasing rather than improving.  MAKE SURE YOU:  Understand these instructions.  Will watch your condition.  Will get help right away if you are not doing well or get worse.    Post Anesthesia Home Care Instructions  Activity: Get plenty of rest for the remainder of the day. A responsible adult should stay with you for 24 hours following the procedure.  For the next 24 hours, DO NOT: -Drive a car -Paediatric nurse -Drink alcoholic beverages -Take any medication unless instructed by your physician -Make any legal decisions or sign important papers.  Meals: Start with liquid foods such as gelatin or soup. Progress to regular foods as tolerated. Avoid greasy, spicy, heavy foods. If nausea and/or vomiting occur, drink only clear liquids until the nausea and/or vomiting subsides. Call your physician if vomiting continues.  Special Instructions/Symptoms: Your throat may feel dry or sore from the anesthesia or the breathing tube placed in your throat during surgery. If this causes discomfort, gargle with warm salt water. The discomfort should disappear within 24 hours. Regional Anesthesia Blocks  1. Numbness or the inability to move the "blocked" extremity may last from 3-48 hours after placement. The length of time depends on the medication injected and your individual response to the medication. If the numbness is not going away after 48 hours, call your surgeon.  2. The extremity that is blocked will need to be protected until the numbness is gone and the  Strength has returned. Because you cannot feel it, you will need  to take extra care to avoid injury. Because it may be weak, you may have difficulty moving it or using it. You may not know what position it is in without looking at it while the block is in effect.  3. For blocks in the legs and feet, returning to weight bearing and walking needs to be done  carefully. You will need to wait until the numbness is entirely gone and the strength has returned. You should be able to move your leg and foot normally before you try and bear weight or walk. You will need someone to be with you when you first try to ensure you do not fall and possibly risk injury.  4. Bruising and tenderness at the needle site are common side effects and will resolve in a few days.  5. Persistent numbness or new problems with movement should be communicated to the surgeon or the [facility] [phone] [place] [phone]).

## 2013-12-15 NOTE — Anesthesia Postprocedure Evaluation (Signed)
Anesthesia Post Note  Patient: Michael [name]  Procedure(s) Performed: Procedure(s) (LRB): RIGHT SHOULDER ARTHROSCOPY WITH EXTENSIVE DEBRIDEMENT OF ROTATOR CUFF,SUBACROMIAL DECOMPRESSION, DEBRIDMENT OF BURSA (Right)  Anesthesia type: general  Patient location: PACU  Post pain: Pain level controlled  Post assessment: Patient's Cardiovascular Status Stable  Last Vitals:  Filed Vitals:   12/15/13 1115  BP:   Pulse: 79  Temp:   Resp: 12    Post vital signs: Reviewed and stable  Level of consciousness: sedated  Complications: No apparent anesthesia complications

## 2013-12-15 NOTE — Interval H&P Note (Signed)
History and Physical Interval Note:  12/15/2013 7:28 AM  Michael [name]  has presented today for surgery, with the diagnosis of Impingement syndrome  The various methods of treatment have been discussed with the patient and family. After consideration of risks, benefits and other options for treatment, the patient has consented to  Procedure(s): RIGHT SHOULDER ARTHROSCOPY DEBRIDEMENT EXTENSIVE WITH SUBACROMIAL DECOMPRESSION PARTIAL ACROMIOPLASTY WITH CORACOACROMIAL RELEASE (Right) as a surgical intervention .  The patient's history has been reviewed, patient examined, no change in status, stable for surgery.  I have reviewed the patient's chart and labs.  Questions were answered to the patient's satisfaction.     [name]

## 2013-12-15 NOTE — Progress Notes (Signed)
Assisted Dr. Tobias Alexander with right ultrasound guided ISB block. Side rails up, monitors on throughout procedure. See vital signs in flow sheet. Tolerated Procedure well.

## 2013-12-15 NOTE — Anesthesia Preprocedure Evaluation (Addendum)
Anesthesia Evaluation  Patient identified by MRN, date of birth, ID band Patient awake    Reviewed: Allergy & Precautions, H&P , NPO status , Patient's Chart, lab work & pertinent test results  History of Anesthesia Complications Negative for: history of anesthetic complications  Airway Mallampati: II TM Distance: >3 FB Neck ROM: Full    Dental  (+) Teeth Intact, Dental Advisory Given   Pulmonary asthma ,    Pulmonary exam normal       Cardiovascular hypertension, Pt. on medications     Neuro/Psych PSYCHIATRIC DISORDERS Depression negative neurological ROS     GI/Hepatic GERD-  Medicated,(+) Hepatitis -, B  Endo/Other  negative endocrine ROS  Renal/GU negative Renal ROS     Musculoskeletal   Abdominal   Peds  Hematology   Anesthesia Other Findings   Reproductive/Obstetrics                          Anesthesia Physical Anesthesia Plan  ASA: III  Anesthesia Plan: General   Post-op Pain Management:    Induction: Intravenous  Airway Management Planned: Oral ETT  Additional Equipment:   Intra-op Plan:   Post-operative Plan: Extubation in OR  Informed Consent: I have reviewed the patients History and Physical, chart, labs and discussed the procedure including the risks, benefits and alternatives for the proposed anesthesia with the patient or authorized representative who has indicated his/her understanding and acceptance.   Dental advisory given  Plan Discussed with: CRNA, Anesthesiologist and Surgeon  Anesthesia Plan Comments:        Anesthesia Quick Evaluation

## 2013-12-16 NOTE — Op Note (Signed)
[name], Michael [name] NO.:  [id number]  MEDICAL RECORD NO.:  [id number]  LOCATION:                                 FACILITY:  PHYSICIAN:  Ninetta Lights, M.D. DATE OF BIRTH:  November 28, 1955  DATE OF PROCEDURE:  12/15/2013 DATE OF DISCHARGE:  12/15/2013                              OPERATIVE REPORT   PREOPERATIVE DIAGNOSES:  Right shoulder recurrent traumatic impingement. Previous arthroscopy with decompression in distal clavicle excision. Some recurrent bony overgrowth in the anterior acromion.  POSTOPERATIVE DIAGNOSES:  Right shoulder recurrent traumatic impingement. Previous arthroscopy with decompression in distal clavicle excision.  Some recurrent bony overgrowth in the anterior acromion with subacromial bursitis, abrasive partial tearing on the top of the cuff and subacromial adhesions.  PROCEDURE: 1. Right shoulder exam under anesthesia, arthroscopy. 2. Lysis and debridement of adhesions. 3. Debridement of rotator cuff. 4. Revision acromioplasty released coracoacromial ligament.  SURGEON:  Ninetta Lights, M.D.  ASSISTANT:  Eula Listen, PA-C, present throughout the entire case and necessary for timely completion of procedure.  ANESTHESIA:  General.  BLOOD LOSS:  Minimal.  SPECIMENS:  None.  CULTURES:  None.  COMPLICATIONS:  None.  DRESSINGS:  Soft compressive with Sling.  PROCEDURE IN DETAIL:  The patient was brought to the operating room, placed on the operating table in supine position.  After adequate anesthesia had been obtained, placed in beach-chair position.  Shoulder positioner, prepped and draped in usual sterile fashion.  Full motion and stable shoulder.  Two portals, lateral and posterior.  Arthroscope introduced, shoulder distended and inspected.  The anterior of the shoulder really looked quite good.  Healed labral tear and removal of his labral cyst.  No recurrence.  Articular cartilage, undersurface of cuff, biceps  tendon and biceps anchor intact.  Cannula was redirected subacromially.  Reactive bursitis debrided.  Some of the adhesions there debrided.  Roughening on the top of the cuff debrided.  Revision acromioplasty back to a type 1 acromion releasing the CA ligament.  Confirmed adequate previous resection distal clavicle.  Entire space examined.  Instruments were fully removed.  Portals were closed with nylon.  Sterile compressive dressing applied.  Sling applied.  Anesthesia reversed.  Brought to the recovery room.  Tolerated the surgery well.  No complications.     Ninetta Lights, M.D.     DFM/MEDQ  D:  12/15/2013  T:  12/16/2013  Job:  [id number]

## 2013-12-16 NOTE — Addendum Note (Signed)
Addendum created 12/16/13 1442 by Christia Reading Zela Sobieski   Modules edited: Charges VN

## 2013-12-19 ENCOUNTER — Encounter (HOSPITAL_BASED_OUTPATIENT_CLINIC_OR_DEPARTMENT_OTHER): Payer: Self-pay | Admitting: Orthopedic Surgery

## 2014-01-02 ENCOUNTER — Emergency Department (HOSPITAL_COMMUNITY)
Admission: EM | Admit: 2014-01-02 | Discharge: 2014-01-02 | Disposition: A | Discharge status: Home / Self Care | Payer: Managed Care, Other (non HMO) | Source: Home / Self Care

## 2014-01-31 ENCOUNTER — Encounter (HOSPITAL_COMMUNITY): Payer: Self-pay | Admitting: Emergency Medicine

## 2014-01-31 ENCOUNTER — Ambulatory Visit
Admission: RE | Admit: 2014-01-31 | Discharge: 2014-01-31 | Disposition: A | Discharge status: Home / Self Care | Payer: Managed Care, Other (non HMO) | Source: Ambulatory Visit | Attending: Family Medicine | Admitting: Family Medicine

## 2014-01-31 ENCOUNTER — Other Ambulatory Visit: Payer: Self-pay | Admitting: Family Medicine

## 2014-01-31 ENCOUNTER — Inpatient Hospital Stay (HOSPITAL_COMMUNITY)
Admission: EM | Admit: 2014-01-31 | Discharge: 2014-02-02 | DRG: 175 | Disposition: A | Discharge status: Home / Self Care | Payer: Managed Care, Other (non HMO) | Attending: Internal Medicine | Admitting: Internal Medicine

## 2014-01-31 DIAGNOSIS — R0602 Shortness of breath: Secondary | ICD-10-CM

## 2014-01-31 DIAGNOSIS — E785 Hyperlipidemia, unspecified: Secondary | ICD-10-CM | POA: Diagnosis present

## 2014-01-31 DIAGNOSIS — B20 Human immunodeficiency virus [HIV] disease: Secondary | ICD-10-CM | POA: Diagnosis present

## 2014-01-31 DIAGNOSIS — E876 Hypokalemia: Secondary | ICD-10-CM

## 2014-01-31 DIAGNOSIS — R05 Cough: Secondary | ICD-10-CM

## 2014-01-31 DIAGNOSIS — I2699 Other pulmonary embolism without acute cor pulmonale: Principal | ICD-10-CM | POA: Diagnosis present

## 2014-01-31 DIAGNOSIS — R059 Cough, unspecified: Secondary | ICD-10-CM

## 2014-01-31 DIAGNOSIS — D1803 Hemangioma of intra-abdominal structures: Secondary | ICD-10-CM | POA: Diagnosis present

## 2014-01-31 DIAGNOSIS — E43 Unspecified severe protein-calorie malnutrition: Secondary | ICD-10-CM | POA: Diagnosis present

## 2014-01-31 DIAGNOSIS — J189 Pneumonia, unspecified organism: Secondary | ICD-10-CM | POA: Diagnosis present

## 2014-01-31 DIAGNOSIS — IMO0002 Reserved for concepts with insufficient information to code with codable children: Secondary | ICD-10-CM

## 2014-01-31 DIAGNOSIS — I10 Essential (primary) hypertension: Secondary | ICD-10-CM | POA: Diagnosis present

## 2014-01-31 DIAGNOSIS — F329 Major depressive disorder, single episode, unspecified: Secondary | ICD-10-CM | POA: Diagnosis present

## 2014-01-31 DIAGNOSIS — K7689 Other specified diseases of liver: Secondary | ICD-10-CM | POA: Diagnosis present

## 2014-01-31 DIAGNOSIS — F3289 Other specified depressive episodes: Secondary | ICD-10-CM | POA: Diagnosis present

## 2014-01-31 DIAGNOSIS — J90 Pleural effusion, not elsewhere classified: Secondary | ICD-10-CM | POA: Diagnosis present

## 2014-01-31 DIAGNOSIS — J9819 Other pulmonary collapse: Secondary | ICD-10-CM | POA: Diagnosis present

## 2014-01-31 DIAGNOSIS — K219 Gastro-esophageal reflux disease without esophagitis: Secondary | ICD-10-CM | POA: Diagnosis present

## 2014-01-31 LAB — CBC WITH DIFFERENTIAL/PLATELET
Basophils Absolute: 0 10*3/uL (ref 0.0–0.1)
Basophils Relative: 0 % (ref 0–1)
EOS ABS: 0.1 10*3/uL (ref 0.0–0.7)
Eosinophils Relative: 1 % (ref 0–5)
HEMATOCRIT: 38.6 % — AB (ref 39.0–52.0)
HEMOGLOBIN: 14 g/dL (ref 13.0–17.0)
LYMPHS ABS: 2.1 10*3/uL (ref 0.7–4.0)
Lymphocytes Relative: 19 % (ref 12–46)
MCH: 33.6 pg (ref 26.0–34.0)
MCHC: 36.3 g/dL — AB (ref 30.0–36.0)
MCV: 92.6 fL (ref 78.0–100.0)
MONO ABS: 0.7 10*3/uL (ref 0.1–1.0)
MONOS PCT: 6 % (ref 3–12)
NEUTROS PCT: 74 % (ref 43–77)
Neutro Abs: 8.2 10*3/uL — ABNORMAL HIGH (ref 1.7–7.7)
Platelets: 332 10*3/uL (ref 150–400)
RBC: 4.17 MIL/uL — AB (ref 4.22–5.81)
RDW: 12.2 % (ref 11.5–15.5)
WBC: 11 10*3/uL — ABNORMAL HIGH (ref 4.0–10.5)

## 2014-01-31 LAB — BASIC METABOLIC PANEL
BUN: 13 mg/dL (ref 6–23)
CO2: 27 mEq/L (ref 19–32)
CREATININE: 0.97 mg/dL (ref 0.50–1.35)
Calcium: 9.2 mg/dL (ref 8.4–10.5)
Chloride: 90 mEq/L — ABNORMAL LOW (ref 96–112)
GFR calc Af Amer: >90 mL/min (ref >90)
GFR calc non Af Amer: 89 mL/min — ABNORMAL LOW (ref >90)
GLUCOSE: 89 mg/dL (ref 70–99)
POTASSIUM: 3 meq/L — AB (ref 3.7–5.3)
Sodium: 133 mEq/L — ABNORMAL LOW (ref 137–147)

## 2014-01-31 LAB — PROTIME-INR
INR: 1.21 (ref 0.00–1.49)
Prothrombin Time: 15 seconds (ref 11.6–15.2)

## 2014-01-31 LAB — HEPATIC FUNCTION PANEL
ALT: 76 U/L — AB (ref 0–53)
AST: 34 U/L (ref 0–37)
Albumin: 2.7 g/dL — ABNORMAL LOW (ref 3.5–5.2)
Alkaline Phosphatase: 75 U/L (ref 39–117)
BILIRUBIN TOTAL: 0.5 mg/dL (ref 0.3–1.2)
Total Protein: 7.4 g/dL (ref 6.0–8.3)

## 2014-01-31 LAB — APTT: aPTT: 28 seconds (ref 24–37)

## 2014-01-31 LAB — LACTATE DEHYDROGENASE: LDH: 225 U/L (ref 94–250)

## 2014-01-31 MED ORDER — VALACYCLOVIR HCL 500 MG PO TABS
500.0000 mg | ORAL_TABLET | Freq: Every day | ORAL | Status: DC
  Administered 2014-02-01 – 2014-02-02 (×2): 500 mg via ORAL
  Filled 2014-01-31 (×2): qty 1

## 2014-01-31 MED ORDER — HEPARIN (PORCINE) IN NACL 100-0.45 UNIT/ML-% IJ SOLN
1200.0000 [IU]/h | INTRAMUSCULAR | Status: DC
  Administered 2014-01-31: 1200 [IU]/h via INTRAVENOUS
  Filled 2014-01-31: qty 250

## 2014-01-31 MED ORDER — DEXTROSE 5 % IV SOLN
500.0000 mg | Freq: Once | INTRAVENOUS | Status: AC
  Administered 2014-01-31: 500 mg via INTRAVENOUS

## 2014-01-31 MED ORDER — ATORVASTATIN CALCIUM 10 MG PO TABS
10.0000 mg | ORAL_TABLET | Freq: Every day | ORAL | Status: DC
  Administered 2014-02-01 – 2014-02-02 (×2): 10 mg via ORAL
  Filled 2014-01-31 (×2): qty 1

## 2014-01-31 MED ORDER — DEXTROSE 5 % IV SOLN
2.0000 g | Freq: Once | INTRAVENOUS | Status: AC
  Administered 2014-01-31: 2 g via INTRAVENOUS
  Filled 2014-01-31: qty 2

## 2014-01-31 MED ORDER — DOLUTEGRAVIR SODIUM 50 MG PO TABS
50.0000 mg | ORAL_TABLET | Freq: Every day | ORAL | Status: DC
  Administered 2014-02-01: 50 mg via ORAL
  Filled 2014-01-31: qty 1

## 2014-01-31 MED ORDER — MORPHINE SULFATE 2 MG/ML IJ SOLN: 1.0000 mg | INTRAMUSCULAR | Status: DC | PRN

## 2014-01-31 MED ORDER — IOHEXOL 300 MG/ML  SOLN
75.0000 mL | Freq: Once | INTRAMUSCULAR | Status: AC | PRN
  Administered 2014-01-31: 75 mL via INTRAVENOUS

## 2014-01-31 MED ORDER — ALPRAZOLAM 0.5 MG PO TABS
1.0000 mg | ORAL_TABLET | Freq: Three times a day (TID) | ORAL | Status: DC
  Administered 2014-02-01 – 2014-02-02 (×4): 1 mg via ORAL
  Filled 2014-01-31 (×4): qty 2

## 2014-01-31 MED ORDER — POTASSIUM CHLORIDE CRYS ER 20 MEQ PO TBCR
40.0000 meq | EXTENDED_RELEASE_TABLET | Freq: Once | ORAL | Status: AC
  Administered 2014-01-31: 40 meq via ORAL
  Filled 2014-01-31: qty 2

## 2014-01-31 MED ORDER — DARUNAVIR-COBICISTAT 800-150 MG PO TABS
1.0000 | ORAL_TABLET | Freq: Every day | ORAL | Status: DC
  Administered 2014-02-01: 1 via ORAL

## 2014-01-31 MED ORDER — OXYCODONE-ACETAMINOPHEN 5-325 MG PO TABS: 1.0000 | ORAL_TABLET | ORAL | Status: DC | PRN

## 2014-01-31 MED ORDER — POTASSIUM CHLORIDE CRYS ER 20 MEQ PO TBCR
40.0000 meq | EXTENDED_RELEASE_TABLET | Freq: Once | ORAL | Status: DC
  Filled 2014-01-31: qty 2

## 2014-01-31 MED ORDER — HEPARIN BOLUS VIA INFUSION
4000.0000 [IU] | Freq: Once | INTRAVENOUS | Status: AC
  Administered 2014-01-31: 4000 [IU] via INTRAVENOUS
  Filled 2014-01-31: qty 4000

## 2014-01-31 MED ORDER — ENOXAPARIN SODIUM 80 MG/0.8ML ~~LOC~~ SOLN
1.0000 mg/kg | Freq: Two times a day (BID) | SUBCUTANEOUS | Status: DC
  Administered 2014-01-31 – 2014-02-01 (×2): 75 mg via SUBCUTANEOUS
  Filled 2014-01-31 (×3): qty 0.8

## 2014-01-31 MED ORDER — EMTRICITABINE-TENOFOVIR DF 200-300 MG PO TABS
1.0000 | ORAL_TABLET | Freq: Every day | ORAL | Status: DC
  Administered 2014-02-01 – 2014-02-02 (×2): 1 via ORAL
  Filled 2014-01-31 (×2): qty 1

## 2014-01-31 MED ORDER — OMEGA-3-ACID ETHYL ESTERS 1 G PO CAPS
2.0000 | ORAL_CAPSULE | Freq: Every day | ORAL | Status: DC
  Administered 2014-02-01 – 2014-02-02 (×2): 2 g via ORAL
  Filled 2014-01-31 (×2): qty 2

## 2014-01-31 MED ORDER — PANTOPRAZOLE SODIUM 40 MG PO TBEC
40.0000 mg | DELAYED_RELEASE_TABLET | Freq: Every day | ORAL | Status: DC
  Administered 2014-02-01 – 2014-02-02 (×2): 40 mg via ORAL
  Filled 2014-01-31: qty 1

## 2014-01-31 MED ORDER — POTASSIUM CHLORIDE CRYS ER 20 MEQ PO TBCR: 40.0000 meq | EXTENDED_RELEASE_TABLET | Freq: Two times a day (BID) | ORAL | Status: DC

## 2014-01-31 MED ORDER — ENALAPRIL-HYDROCHLOROTHIAZIDE 10-25 MG PO TABS: 1.0000 | ORAL_TABLET | Freq: Every day | ORAL | Status: DC

## 2014-01-31 NOTE — ED Notes (Signed)
PA Nicole at the bedside  

## 2014-01-31 NOTE — ED Notes (Signed)
Report called to Sydell Axon, RN 5W.

## 2014-01-31 NOTE — ED Notes (Signed)
Per Dr. Ernestina Patches, he would like to recheck BMET before administering additional dose of Potassium PO.

## 2014-01-31 NOTE — Progress Notes (Signed)
ANTICOAGULATION CONSULT NOTE - Initial Consult  Pharmacy Consult for Heparin  Indication: pulmonary embolus  Allergies  Allergen Reactions  . Efavirenz Rash    Patient Measurements: Height: 5\' 5"  (165.1 cm) Weight: 168 lb (76.204 kg) IBW/kg (Calculated) : 61.5 Heparin Dosing Weight: n/a  Vital Signs: Temp: 98.3 F (36.8 C) (04/28 1901) Temp src: Oral (04/28 1901) BP: 147/80 mmHg (04/28 1901) Pulse Rate: 96 (04/28 1901)  Labs: No results found for this basename: HGB, HCT, PLT, APTT, LABPROT, INR, HEPARINUNFRC, CREATININE, CKTOTAL, CKMB, TROPONINI,  in the last 72 hours  Estimated Creatinine Clearance: 72.2 ml/min (by C-G formula based on Cr of 1.05).   Medical History: Past Medical History  Diagnosis Date  . HTN (hypertension)   . Hyperlipidemia   . HIV (human immunodeficiency virus infection)   . GERD (gastroesophageal reflux disease)     Medications:   (Not in a hospital admission)  Assessment: 54 YOM with 2 week dry cough, heaviness to chest, and low grade fever. CT scan shows bilateral PE. Pharmacy to start heparin drip. Hgb and Plt wnl. Pt not on any anticoagulation prior to admission.   Goal of Therapy:  Heparin level 0.3-0.7 units/ml Monitor platelets by anticoagulation protocol: Yes   Plan:  Give 4000 units bolus x 1 Start heparin infusion at 1200 units/hr Check anti-Xa level in 6 hours and daily while on heparin Continue to monitor H&H and platelets  Albertina Parr, PharmD.  Clinical Pharmacist Pager [phone]

## 2014-01-31 NOTE — ED Provider Notes (Signed)
CSN: [id number]     Arrival date & time 01/31/14  1855 History   None    Chief Complaint  Patient presents with  . Cough  . Chest Pain     (Consider location/radiation/quality/duration/timing/severity/associated sxs/prior Treatment) HPI  Michael [name] is a 58 y.o. male with past medical history significant for HIV (last CD4 count was over 700), hypertension hyperlipidemia complaining of 2 weeks of dry cough, low-grade fever, malaise, shortness of breath. Patient had right shoulder surgery by Kathryne Hitch on March 12. He was diagnosed by Dr. Percell Miller with shingles on the right side based on a right-sided chest pain with no rash. Patient was seen today by PCP who ordered chest x-ray and CT which showed bilateral pulmonary embolisms. Patient denies any calf pain, leg swelling, arm pain or swelling, history of DVT or PE, history of malignancy, history of clotting disorder. He denies active chest pain, refusing pain medication. Endorses a mild shortness of breath.   Past Medical History  Diagnosis Date  . HTN (hypertension)   . Hyperlipidemia   . HIV (human immunodeficiency virus infection)   . GERD (gastroesophageal reflux disease)    Past Surgical History  Procedure Laterality Date  . Rotator cuff surgery      right and left  . Laminectomy  2010    C4/C5  . Appendectomy    . Tonsillectomy    . Colonoscopy    . Shoulder arthroscopy with subacromial decompression Right 12/15/2013    Procedure: RIGHT SHOULDER ARTHROSCOPY WITH EXTENSIVE DEBRIDEMENT OF ROTATOR CUFF,SUBACROMIAL DECOMPRESSION, DEBRIDMENT OF BURSA;  Surgeon: Ninetta Lights, MD;  Location: [facility];  Service: Orthopedics;  Laterality: Right;   Family History  Problem Relation Age of Onset  . Diabetes Mother   . Heart attack Paternal Grandfather    History  Substance Use Topics  . Smoking status: Never Smoker   . Smokeless tobacco: Never Used  . Alcohol Use: Yes     Comment: occ    Review of  Systems 10 systems reviewed and found to be negative, except as noted in the HPI  Allergies  Efavirenz  Home Medications   Prior to Admission medications   Medication Sig Start Date End Date Taking? Authorizing Provider  ALPRAZolam (XANAX XR) 3 MG 24 hr tablet Take 3 mg by mouth daily.      Historical Provider, MD  atorvastatin (LIPITOR) 10 MG tablet Take 10 mg by mouth daily.      Historical Provider, MD  BECONASE AQ 42 MCG/SPRAY nasal spray Place into the nose daily. 12/29/12   Historical Provider, MD  cyclobenzaprine (FLEXERIL) 10 MG tablet Take 1 tablet (10 mg total) by mouth at bedtime as needed for muscle spasms. 04/12/13   Stefanie Libel, MD  darunavir (PREZISTA) 400 MG tablet Take 800 mg by mouth daily.      Historical Provider, MD  dolutegravir (TIVICAY) 50 MG tablet Take 50 mg by mouth daily.    Historical Provider, MD  emtricitabine-tenofovir (TRUVADA) 200-300 MG per tablet Take 1 tablet by mouth daily.      Historical Provider, MD  enalapril-hydrochlorothiazide (VASERETIC) 10-25 MG per tablet Take 1 tablet by mouth daily. 12/09/12   Historical Provider, MD  esomeprazole (NEXIUM) 40 MG capsule Take 40 mg by mouth 2 (two) times daily.      Historical Provider, MD  LOVAZA 1 G capsule Take 2 capsules by mouth daily. 04/29/13   Historical Provider, MD  nitroGLYCERIN (NITRODUR - DOSED IN MG/24 HR) 0.2  mg/hr Apply 1/4 patch to affected area daily.  Change patch every 24 hours. 02/24/13   Stefanie Libel, MD  ondansetron (ZOFRAN) 4 MG tablet Take 1 tablet (4 mg total) by mouth every 8 (eight) hours as needed for nausea or vomiting. 12/15/13   M. Doran Stabler, PA-C  oxyCODONE-acetaminophen (ROXICET) 5-325 MG per tablet Take 1-2 tablets by mouth every 4 (four) hours as needed. 12/15/13   M. Doran Stabler, PA-C  predniSONE (DELTASONE) 10 MG tablet TAKE AD 04/22/13   Thurman Coyer, DO  valACYclovir (VALTREX) 500 MG tablet as needed. 01/28/13   Historical Provider, MD   BP 147/80  Pulse 96  Temp(Src)  98.3 F (36.8 C) (Oral)  Resp 18  Ht 5\' 5"  (1.651 m)  Wt 168 lb (76.204 kg)  BMI 27.96 kg/m2  SpO2 95% Physical Exam  Nursing note and vitals reviewed. Constitutional: He is oriented to person, place, and time. He appears well-developed and well-nourished. No distress.  HENT:  Head: Normocephalic and atraumatic.  Mouth/Throat: Oropharynx is clear and moist.  Eyes: Conjunctivae and EOM are normal. Pupils are equal, round, and reactive to light.  Neck: Normal range of motion.  Cardiovascular: Normal rate, regular rhythm and intact distal pulses.   Pulmonary/Chest: Effort normal and breath sounds normal. No stridor. No respiratory distress. He has no wheezes. He has no rales. He exhibits no tenderness.  Abdominal: Soft. Bowel sounds are normal. He exhibits no distension and no mass. There is no tenderness. There is no rebound and no guarding.  Musculoskeletal: Normal range of motion.  No calf/arm asymmetry, superficial collaterals, palpable cords, edema, Homans sign negative bilaterally.    Neurological: He is alert and oriented to person, place, and time.  Psychiatric: He has a normal mood and affect.    ED Course  Procedures (including critical care time) Labs Review Labs Reviewed  CBC WITH DIFFERENTIAL - Abnormal; Notable for the following:    WBC 11.0 (*)    RBC 4.17 (*)    HCT 38.6 (*)    MCHC 36.3 (*)    Neutro Abs 8.2 (*)    All other components within normal limits  BASIC METABOLIC PANEL - Abnormal; Notable for the following:    Sodium 133 (*)    Potassium 3.0 (*)    Chloride 90 (*)    GFR calc non Af Amer 89 (*)    All other components within normal limits  HEPATIC FUNCTION PANEL - Abnormal; Notable for the following:    Albumin 2.7 (*)    ALT 76 (*)    All other components within normal limits  COMPREHENSIVE METABOLIC PANEL - Abnormal; Notable for the following:    Sodium 134 (*)    Potassium 3.4 (*)    Chloride 94 (*)    Glucose, Bld 117 (*)    Albumin 2.3  (*)    ALT 60 (*)    All other components within normal limits  CBC WITH DIFFERENTIAL - Abnormal; Notable for the following:    RBC 3.59 (*)    Hemoglobin 12.0 (*)    HCT 33.3 (*)    All other components within normal limits  PROTEIN C ACTIVITY - Abnormal; Notable for the following:    Protein C Activity 153 (*)    All other components within normal limits  PROTEIN S ACTIVITY - Abnormal; Notable for the following:    Protein S Activity 147 (*)    All other components within normal limits  LUPUS ANTICOAGULANT PANEL -  Abnormal; Notable for the following:    PTT Lupus Anticoagulant 45.6 (*)    PTTLA Confirmation 12.7 (*)    PTTLA 4:1 Mix 43.8 (*)    Drvvt 46.3 (*)    Lupus Anticoagulant DETECTED (*)    All other components within normal limits  BETA-2-GLYCOPROTEIN I ABS, IGG/M/A - Abnormal; Notable for the following:    Beta-2-Glycoprotein I IgA 46 (*)    All other components within normal limits  CARDIOLIPIN ANTIBODIES, IGG, IGM, IGA - Abnormal; Notable for the following:    Anticardiolipin IgG 3 (*)    Anticardiolipin IgM 7 (*)    Anticardiolipin IgA 7 (*)    All other components within normal limits  COMPREHENSIVE METABOLIC PANEL - Abnormal; Notable for the following:    Sodium 136 (*)    Potassium 3.6 (*)    Chloride 94 (*)    Glucose, Bld 103 (*)    Albumin 2.5 (*)    GFR calc non Af Amer 89 (*)    All other components within normal limits  CBC WITH DIFFERENTIAL - Abnormal; Notable for the following:    RBC 3.93 (*)    Hemoglobin 12.8 (*)    HCT 36.9 (*)    All other components within normal limits  LACTATE DEHYDROGENASE  APTT  PROTIME-INR  T-HELPER CELLS (CD4) COUNT  HIV 1 RNA QUANT-NO REFLEX-BLD  MAGNESIUM  ANTITHROMBIN III  HOMOCYSTEINE  PROTEIN C, TOTAL  PROTEIN S, TOTAL  FACTOR 5 LEIDEN  PROTHROMBIN GENE MUTATION    Imaging Review Dg Chest 2 View  01/31/2014   CLINICAL DATA:  COUGH/ SOB/ ? COLLPASED LUNG /PNEUMONIA  EXAM: CHEST  2 VIEW  COMPARISON:   None.  FINDINGS: The cardiac silhouette is within normal limits. Increased consolidated density right lung base. There is blunting of the right costophrenic angle. On the lateral view the area within the right lung base has the appearance of an a focal eventration versus diaphragmatic herniation, mass cannot be excluded. The aerated right lung base demonstrates ill-defined density. Left lung is clear. No acute osseous abnormalities.  IMPRESSION: Consolidative density right lung base differential considerations are consolidative infiltrate, rounded atelectasis, diaphragmatic eventration versus herniation, or possibly a mass. Further evaluation with chest CT is recommended. There also findings within the aerated lung in the right lung base consistent with atelectasis versus airspace disease as well as a small right effusion.   Electronically Signed   By: Margaree Mackintosh M.D.   On: 01/31/2014 15:52   Ct Chest W Contrast  01/31/2014   CLINICAL DATA:  Cough/sob/no breath sounds RLL  EXAM: CT CHEST WITH CONTRAST  TECHNIQUE: Multidetector CT imaging of the chest was performed during intravenous contrast administration.  CONTRAST:  28mL OMNIPAQUE IOHEXOL 300 MG/ML  SOLN  COMPARISON:  DG CHEST 2 VIEW dated 01/31/2014  FINDINGS: The thoracic inlet is unremarkable.  There is no evidence of mediastinal masses nor adenopathy. A filling defect is appreciated within the distal left main pulmonary artery with extension into the left lower lobe branch. A filling defect is also appreciated within the distal right main pulmonary artery with extension into the lower lobe branch. RV/LV ratio 0.74 This is not a pulmonary embolus protocol and evaluation of the remaining portions of the pulmonary arterial system is limited. There is no evidence of a thoracic aortic aneurysm.  There is increased density within the right hilar region a component of which is the filling defect within the pulmonary artery. A small mass or adenopathy within  this area cannot be excluded.  A small right pleural effusion is appreciated. There is also diffuse increased density in the dependent portion of the right lung base. Linear areas of increased density appreciated within the right lung base.  There is focal eventration of the right hemidiaphragm containing intra-abdominal fat and portions of the liver. This finding corresponds to the density on the lateral chest radiograph.  Linear areas of atelectasis versus scarring are appreciated in the left lung base.  The central airways are patent.  Evaluation of visualized upper abdominal viscera demonstrates a 2.4 cm low attenuating nodule within the posterior aspect of the right lobe of the liver image 57 series 2. Remaining visualized upper abdominal viscera are unremarkable.  There is no evidence of aggressive appearing osseous lesions.  IMPRESSION: 1. Bilateral pulmonary emboli. Critical Value/emergent results were called by telephone at the time of interpretation on 01/31/2014 at 6:46 PM to Dr. Vivi Martens, who verbally acknowledged these results. 2. Increased density right lung base differential considerations infiltrate versus atelectasis versus lung infarction 3. Indeterminate 2.4 cm nodule right lobe of the liver further evaluation with dedicated liver protocol MRI is recommended, considering the findings within the lungs differential considerations are primary versus metastatic neoplastic disease 4. Small right pleural effusion   Electronically Signed   By: Margaree Mackintosh M.D.   On: 01/31/2014 18:48     EKG Interpretation   Date/Time:  Tuesday January 31 2014 19:07:23 EDT Ventricular Rate:  95 PR Interval:  148 QRS Duration: 90 QT Interval:  394 QTC Calculation: 495 R Axis:   122 Text Interpretation:  Normal sinus rhythm Cannot rule out Inferior infarct  , age undetermined Anterolateral infarct , age undetermined Abnormal ECG  ED PHYSICIAN INTERPRETATION AVAILABLE IN [facility] Confirmed by  TEST,  Record ([id number]) on 02/02/2014 7:31:07 AM      MDM   Final diagnoses:  None    Filed Vitals:   01/31/14 1901  BP: 147/80  Pulse: 96  Temp: 98.3 F (36.8 C)  TempSrc: Oral  Resp: 18  Height: 5\' 5"  (1.651 m)  Weight: 168 lb (76.204 kg)  SpO2: 95%    Michael [name] is a 58 y.o. male complaining of presenting with bilateral PE. Vital signs stable and patient no acute distress. Pharmacy dose heparin initiated. It is unclear what the initiating factor is for the DVT. Patient minor arthroscopic shoulder surgery but has been relatively mobile since that time. There is concern for malignancy based on liver lesion noted on CT. possible additional infiltrate. Patient will be started on PNA ABx Patient will be admitted to Dr. Ernestina Patches.    Monico Blitz, PA-C 02/02/14 [place], PA-C 02/02/14 1858

## 2014-01-31 NOTE — ED Notes (Signed)
Admitting Physician at the bedside.  

## 2014-01-31 NOTE — Progress Notes (Signed)
ANTICOAGULATION CONSULT NOTE - Initial Consult  Pharmacy Consult for Lovenox Indication: pulmonary embolus  Patient Measurements: Height: 5\' 5"  (165.1 cm) Weight: 168 lb (76.204 kg) IBW/kg (Calculated) : 61.5  Labs:  Recent Labs  01/31/14 1923  HGB 14.0  HCT 38.6*  PLT 332  APTT 28  LABPROT 15.0  INR 1.21  CREATININE 0.97   Estimated Creatinine Clearance: 78.2 ml/min (by C-G formula based on Cr of 0.97).  Medical History: Past Medical History  Diagnosis Date  . HTN (hypertension)   . Hyperlipidemia   . HIV (human immunodeficiency virus infection)   . GERD (gastroesophageal reflux disease)     Assessment: 58 y/o with bilateral PE switching from heparin to Lovenox with plans for oral anticoagulation discussion in the AM. CBC/renal function ok, other labs as above.   Goal of Therapy:  Monitor platelets by anticoagulation protocol: Yes   Plan:  -Lovenox 75 mg subcutaneous q12h -DC Heparin drip -Minimum q72h CBC (currently ordered daily) -Monitor for bleeding -F/U oral AC plans  Narda Bonds 01/31/2014,11:42 PM

## 2014-01-31 NOTE — ED Notes (Signed)
Pt reports having surgery to right shoulder 3/12 and having cough, fatigue, heaviness to chest, went to pcp and sent for ct scan, +PE. Airway intact at triage.

## 2014-01-31 NOTE — H&P (Addendum)
Hospitalist Admission History and Physical  Patient name: Michael [name] Medical record number: [id number] Date of birth: May 26, 1956 Age: 58 y.o. Gender: male  Primary Care Provider: Adella Hare, MD  Chief Complaint: dyspnea, PE  History of Present Illness:This is a 58 y.o. year old male with noted prior history of HIV or HAART therapy will control with recent CD4 count 700 and undetectable viral load, hypertension, GERD presented with dyspnea and PE. Patient had right shoulder arthroscopy about 2 weeks ago for chronic shoulder pain. Patient states this point he's had progressive chest pain and cough. Was initially seen by ortho for this issue at surgical follow up. was initially thought to be shingles. There is no developing a rash or blistering. Symptoms persisted. Was seen by outpatient doctor today. Had a chest x-ray dose concerning for infiltrative process. Patient was directed to the ER for further evaluation. In the ER, patient had chest CT that was indicative of bilateral PE of the right lung infiltrate versus infarction and right liver lobe lesion. Patient started on full dose heparin for treatment. Hemodynamically stable on presentation. White blood cell count 11. Potassium at 3.0.  Patient Active Problem List   Diagnosis Date Noted  . PE (pulmonary embolism) 01/31/2014  . Impingement syndrome of right shoulder 12/15/2013  . Sacroiliac joint pain 04/12/2013  . Pain in joint, lower leg 02/24/2013  . Pain in left hip 02/24/2013  . 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 Abnormal EKG 07/07/2011  . Preop cardiovascular exam 07/07/2011  . ABDOMINAL PAIN, EPIGASTRIC 03/09/2009  . UNSPECIFIED LABYRINTHITIS 01/24/2009  . FOOT PAIN, LEFT 11/03/2008  . 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 PLANTAR FASCIITIS, BILATERAL 09/26/2008  . HEPATITIS B 10/18/2007  . VENEREAL WART 10/18/2007  . LIVER HEMANGIOMA 10/18/2007  . HYPERLIPIDEMIA 10/18/2007  . DEPRESSION 10/18/2007  . HAY  FEVER 10/18/2007  . PNEUMONIA 10/18/2007  . ASTHMA, CHILDHOOD 10/18/2007  . GASTROESOPHAGEAL REFLUX DISEASE 10/18/2007  . Other Acquired Absence of Organ 10/18/2007  . ROTATOR CUFF REPAIR, RIGHT, HX OF 10/18/2007  . HIV DISEASE 08/05/2007   Past Medical History: Past Medical History  Diagnosis Date  . HTN (hypertension)   . Hyperlipidemia   . HIV (human immunodeficiency virus infection)   . GERD (gastroesophageal reflux disease)     Past Surgical History: Past Surgical History  Procedure Laterality Date  . Rotator cuff surgery      right and left  . Laminectomy  2010    C4/C5  . Appendectomy    . Tonsillectomy    . Colonoscopy    . Shoulder arthroscopy with subacromial decompression Right 12/15/2013    Procedure: RIGHT SHOULDER ARTHROSCOPY WITH EXTENSIVE DEBRIDEMENT OF ROTATOR CUFF,SUBACROMIAL DECOMPRESSION, DEBRIDMENT OF BURSA;  Surgeon: Ninetta Lights, MD;  Location: [facility];  Service: Orthopedics;  Laterality: Right;    Social History: History   Social History  . Marital Status: Divorced    Spouse Name: N/A    Number of Children: N/A  . Years of Education: N/A   Occupational History  . Clinical research [employer]   Social History Main Topics  . Smoking status: Never Smoker   . Smokeless tobacco: Never Used  . Alcohol Use: Yes     Comment: occ  . Drug Use: No  . Sexual Activity: None   Other Topics Concern  . None   Social History Narrative  . None    Family History: Family History  Problem Relation Age of Onset  .  Diabetes Mother   . Heart attack Paternal Grandfather     Allergies: Allergies  Allergen Reactions  . Efavirenz Rash    Current Facility-Administered Medications  Medication Dose Route Frequency Provider Last Rate Last Dose  . [START ON 02/01/2014] ALPRAZolam (XANAX XR) 24 hr tablet 3 mg  3 mg Oral Daily Shanda Howells, MD      . Derrill Memo ON 02/01/2014] atorvastatin (LIPITOR) tablet 10 mg  10 mg Oral Daily Shanda Howells,  MD      . azithromycin (ZITHROMAX) 500 mg in dextrose 5 % 250 mL IVPB  500 mg Intravenous Once [employer], PA-C      . cefTRIAXone (ROCEPHIN) 2 g in dextrose 5 % 50 mL IVPB  2 g Intravenous Once [employer], PA-C      . [START ON 02/01/2014] darunavir-cobicistat (PREZCOBIX) 800-150 MG per tablet 1 tablet  1 tablet Oral Daily Shanda Howells, MD      . Derrill Memo ON 02/01/2014] dolutegravir (TIVICAY) tablet 50 mg  50 mg Oral Daily Shanda Howells, MD      . Derrill Memo ON 02/01/2014] emtricitabine-tenofovir (TRUVADA) 200-300 MG per tablet 1 tablet  1 tablet Oral Daily Shanda Howells, MD      . Derrill Memo ON 02/01/2014] enalapril-hydrochlorothiazide (VASERETIC) 10-25 MG per tablet 1 tablet  1 tablet Oral Daily Shanda Howells, MD      . heparin ADULT infusion 100 units/mL (25000 units/250 mL)  1,200 Units/hr Intravenous Continuous Darnell Level Mancheril, RPH 12 mL/hr at 01/31/14 2136 1,200 Units/hr at 01/31/14 2136  . morphine 2 MG/ML injection 1-2 mg  1-2 mg Intravenous Q2H PRN Shanda Howells, MD      . Derrill Memo ON 02/01/2014] omega-3 acid ethyl esters (LOVAZA) capsule 2 g  2 capsule Oral Daily Shanda Howells, MD      . oxyCODONE-acetaminophen (PERCOCET/ROXICET) 5-325 MG per tablet 1-2 tablet  1-2 tablet Oral Q4H PRN Shanda Howells, MD      . Derrill Memo ON 02/01/2014] pantoprazole (PROTONIX) EC tablet 40 mg  40 mg Oral Daily Shanda Howells, MD      . Derrill Memo ON 02/01/2014] valACYclovir (VALTREX) tablet 500 mg  500 mg Oral Daily Shanda Howells, MD       Current Outpatient Prescriptions  Medication Sig Dispense Refill  . ALPRAZolam (XANAX XR) 3 MG 24 hr tablet Take 3 mg by mouth daily.        Marland Kitchen atorvastatin (LIPITOR) 10 MG tablet Take 10 mg by mouth daily.        Marland Kitchen BECONASE AQ 42 MCG/SPRAY nasal spray Place 1 spray into the nose daily as needed for allergies.       Marland Kitchen darunavir-cobicistat (PREZCOBIX) 800-150 MG per tablet Take 1 tablet by mouth daily. Swallow whole. Do NOT crush, break or chew tablets. Take with food.      .  dolutegravir (TIVICAY) 50 MG tablet Take 50 mg by mouth daily.      Marland Kitchen emtricitabine-tenofovir (TRUVADA) 200-300 MG per tablet Take 1 tablet by mouth daily.        . enalapril-hydrochlorothiazide (VASERETIC) 10-25 MG per tablet Take 1 tablet by mouth daily.      Marland Kitchen esomeprazole (NEXIUM) 40 MG capsule Take 20 mg by mouth daily.       Marland Kitchen LOVAZA 1 G capsule Take 2 capsules by mouth daily.      Marland Kitchen oxyCODONE-acetaminophen (ROXICET) 5-325 MG per tablet Take 1-2 tablets by mouth every 4 (four) hours as needed.  60 tablet  0  . valACYclovir (VALTREX) 500  MG tablet Take 500 mg by mouth daily.        Review Of Systems: 12 point ROS negative except as noted above in HPI.  Physical Exam: Filed Vitals:   01/31/14 2145  BP: 129/60  Pulse: 88  Temp:   Resp: 15    General: alert and cooperative HEENT: PERRLA, extra ocular movement intact and mild lipodystrophy  Heart: S1, S2 normal, no murmur, rub or gallop, regular rate and rhythm Lungs: clear to auscultation, no wheezes or rales and unlabored breathing Abdomen: abdomen is soft without significant tenderness, masses, organomegaly or guarding Extremities: extremities normal, atraumatic, no cyanosis or edema Skin:no rashes, no ecchymoses Neurology: normal without focal findings, mental status, speech normal, alert and oriented x3, PERLA and reflexes normal and symmetric  Labs and Imaging: Lab Results  Component Value Date/Time   NA 133* 01/31/2014  7:23 PM   K 3.0* 01/31/2014  7:23 PM   CL 90* 01/31/2014  7:23 PM   CO2 27 01/31/2014  7:23 PM   BUN 13 01/31/2014  7:23 PM   CREATININE 0.97 01/31/2014  7:23 PM   GLUCOSE 89 01/31/2014  7:23 PM   Lab Results  Component Value Date   WBC 11.0* 01/31/2014   HGB 14.0 01/31/2014   HCT 38.6* 01/31/2014   MCV 92.6 01/31/2014   PLT 332 01/31/2014   Dg Chest 2 View  01/31/2014   CLINICAL DATA:  COUGH/ SOB/ ? COLLPASED LUNG /PNEUMONIA  EXAM: CHEST  2 VIEW  COMPARISON:  None.  FINDINGS: The cardiac silhouette is  within normal limits. Increased consolidated density right lung base. There is blunting of the right costophrenic angle. On the lateral view the area within the right lung base has the appearance of an a focal eventration versus diaphragmatic herniation, mass cannot be excluded. The aerated right lung base demonstrates ill-defined density. Left lung is clear. No acute osseous abnormalities.  IMPRESSION: Consolidative density right lung base differential considerations are consolidative infiltrate, rounded atelectasis, diaphragmatic eventration versus herniation, or possibly a mass. Further evaluation with chest CT is recommended. There also findings within the aerated lung in the right lung base consistent with atelectasis versus airspace disease as well as a small right effusion.   Electronically Signed   By: Margaree Mackintosh M.D.   On: 01/31/2014 15:52   Ct Chest W Contrast  01/31/2014   CLINICAL DATA:  Cough/sob/no breath sounds RLL  EXAM: CT CHEST WITH CONTRAST  TECHNIQUE: Multidetector CT imaging of the chest was performed during intravenous contrast administration.  CONTRAST:  56mL OMNIPAQUE IOHEXOL 300 MG/ML  SOLN  COMPARISON:  DG CHEST 2 VIEW dated 01/31/2014  FINDINGS: The thoracic inlet is unremarkable.  There is no evidence of mediastinal masses nor adenopathy. A filling defect is appreciated within the distal left main pulmonary artery with extension into the left lower lobe branch. A filling defect is also appreciated within the distal right main pulmonary artery with extension into the lower lobe branch. RV/LV ratio 0.74 This is not a pulmonary embolus protocol and evaluation of the remaining portions of the pulmonary arterial system is limited. There is no evidence of a thoracic aortic aneurysm.  There is increased density within the right hilar region a component of which is the filling defect within the pulmonary artery. A small mass or adenopathy within this area cannot be excluded.  A small right  pleural effusion is appreciated. There is also diffuse increased density in the dependent portion of the right lung base. Linear areas  of increased density appreciated within the right lung base.  There is focal eventration of the right hemidiaphragm containing intra-abdominal fat and portions of the liver. This finding corresponds to the density on the lateral chest radiograph.  Linear areas of atelectasis versus scarring are appreciated in the left lung base.  The central airways are patent.  Evaluation of visualized upper abdominal viscera demonstrates a 2.4 cm low attenuating nodule within the posterior aspect of the right lobe of the liver image 57 series 2. Remaining visualized upper abdominal viscera are unremarkable.  There is no evidence of aggressive appearing osseous lesions.  IMPRESSION: 1. Bilateral pulmonary emboli. Critical Value/emergent results were called by telephone at the time of interpretation on 01/31/2014 at 6:46 PM to Dr. Vivi Martens, who verbally acknowledged these results. 2. Increased density right lung base differential considerations infiltrate versus atelectasis versus lung infarction 3. Indeterminate 2.4 cm nodule right lobe of the liver further evaluation with dedicated liver protocol MRI is recommended, considering the findings within the lungs differential considerations are primary versus metastatic neoplastic disease 4. Small right pleural effusion   Electronically Signed   By: Margaree Mackintosh M.D.   On: 01/31/2014 18:48     Assessment and Plan: Michael [name] is a 58 y.o. year old male presenting with dyspnea, PE  Dyspnea:  Likely secondary to pulmonary embolus. Transition to full dose Lovenox. Can discuss long-term treatment options( ie xarelto vs. Coumadin) in am. Noted questionable right lobe infiltrate versus infarction on imaging. Started on community-acquired pneumonia treatment with Rocephin and azithromycin in the ER. We'll continue this. Case discussed with  pulmonary critical care. Feels that affected areas more likely infarction versus infection. Discussed finding of liver lesion on ipsilateral side and possible thoracentesis of right pleural fluid. Will defer unless clinical condition worsens. Check CD4 count as well as viral load. We'll hold off on any aggressive testing such as AFB smear/PCP coverage pending CD4 count and viral load per pulmonology Recs. ? Infectious disease consult in house.  Liver lesion: We'll obtain MRI of per radiology recommendations. Patient reports remote history of liver angiomas in the past. Hopefully MRI to better elucidate this.  HIV: Well controlled per patient. Continue HAART treatment. Check CD4 and viral load. Continue to follow.   HTN: hemodynamically stable. Continue home regimen.   FEN/GI: heart healthy diet. PPI. Replete K. Mag level.   Prophylaxis: lovenox  Disposition: pending further evaluation  Code Status:Full Code.        Shanda Howells MD  Pager: [phone]

## 2014-02-01 ENCOUNTER — Inpatient Hospital Stay (HOSPITAL_COMMUNITY): Payer: Managed Care, Other (non HMO)

## 2014-02-01 ENCOUNTER — Encounter (HOSPITAL_COMMUNITY): Payer: Self-pay | Admitting: General Practice

## 2014-02-01 DIAGNOSIS — J189 Pneumonia, unspecified organism: Secondary | ICD-10-CM | POA: Diagnosis present

## 2014-02-01 DIAGNOSIS — I2699 Other pulmonary embolism without acute cor pulmonale: Secondary | ICD-10-CM

## 2014-02-01 DIAGNOSIS — B20 Human immunodeficiency virus [HIV] disease: Secondary | ICD-10-CM | POA: Diagnosis present

## 2014-02-01 DIAGNOSIS — E43 Unspecified severe protein-calorie malnutrition: Secondary | ICD-10-CM | POA: Diagnosis present

## 2014-02-01 LAB — COMPREHENSIVE METABOLIC PANEL
ALT: 60 U/L — ABNORMAL HIGH (ref 0–53)
AST: 26 U/L (ref 0–37)
Albumin: 2.3 g/dL — ABNORMAL LOW (ref 3.5–5.2)
Alkaline Phosphatase: 63 U/L (ref 39–117)
BUN: 10 mg/dL (ref 6–23)
CHLORIDE: 94 meq/L — AB (ref 96–112)
CO2: 28 mEq/L (ref 19–32)
CREATININE: 0.89 mg/dL (ref 0.50–1.35)
Calcium: 8.7 mg/dL (ref 8.4–10.5)
GFR calc Af Amer: >90 mL/min (ref >90)
GFR calc non Af Amer: >90 mL/min (ref >90)
GLUCOSE: 117 mg/dL — AB (ref 70–99)
Potassium: 3.4 mEq/L — ABNORMAL LOW (ref 3.7–5.3)
Sodium: 134 mEq/L — ABNORMAL LOW (ref 137–147)
Total Bilirubin: 0.3 mg/dL (ref 0.3–1.2)
Total Protein: 6.4 g/dL (ref 6.0–8.3)

## 2014-02-01 LAB — CBC WITH DIFFERENTIAL/PLATELET
BASOS ABS: 0 10*3/uL (ref 0.0–0.1)
Basophils Relative: 0 % (ref 0–1)
EOS ABS: 0.1 10*3/uL (ref 0.0–0.7)
Eosinophils Relative: 1 % (ref 0–5)
HCT: 33.3 % — ABNORMAL LOW (ref 39.0–52.0)
Hemoglobin: 12 g/dL — ABNORMAL LOW (ref 13.0–17.0)
LYMPHS ABS: 1.8 10*3/uL (ref 0.7–4.0)
Lymphocytes Relative: 21 % (ref 12–46)
MCH: 33.4 pg (ref 26.0–34.0)
MCHC: 36 g/dL (ref 30.0–36.0)
MCV: 92.8 fL (ref 78.0–100.0)
Monocytes Absolute: 0.6 10*3/uL (ref 0.1–1.0)
Monocytes Relative: 7 % (ref 3–12)
NEUTROS PCT: 71 % (ref 43–77)
Neutro Abs: 5.8 10*3/uL (ref 1.7–7.7)
Platelets: 299 10*3/uL (ref 150–400)
RBC: 3.59 MIL/uL — AB (ref 4.22–5.81)
RDW: 12.1 % (ref 11.5–15.5)
WBC: 8.2 10*3/uL (ref 4.0–10.5)

## 2014-02-01 LAB — MAGNESIUM: MAGNESIUM: 1.9 mg/dL (ref 1.5–2.5)

## 2014-02-01 LAB — ANTITHROMBIN III: AntiThromb III Func: 88 % (ref 75–120)

## 2014-02-01 MED ORDER — DOLUTEGRAVIR SODIUM 50 MG PO TABS
50.0000 mg | ORAL_TABLET | Freq: Every day | ORAL | Status: DC
  Administered 2014-02-02: 50 mg via ORAL
  Filled 2014-02-01 (×2): qty 1

## 2014-02-01 MED ORDER — RIVAROXABAN 15 MG PO TABS: 15.0000 mg | ORAL_TABLET | Freq: Two times a day (BID) | ORAL | Status: DC

## 2014-02-01 MED ORDER — GADOBENATE DIMEGLUMINE 529 MG/ML IV SOLN
15.0000 mL | Freq: Once | INTRAVENOUS | Status: AC
  Administered 2014-02-01: 15 mL via INTRAVENOUS

## 2014-02-01 MED ORDER — HYDROCHLOROTHIAZIDE 25 MG PO TABS
25.0000 mg | ORAL_TABLET | Freq: Every day | ORAL | Status: DC
  Administered 2014-02-01 – 2014-02-02 (×2): 25 mg via ORAL
  Filled 2014-02-01 (×2): qty 1

## 2014-02-01 MED ORDER — ALBUTEROL SULFATE (2.5 MG/3ML) 0.083% IN NEBU
3.0000 mL | INHALATION_SOLUTION | RESPIRATORY_TRACT | Status: DC | PRN
Start: 2014-02-01 — End: 2014-02-02

## 2014-02-01 MED ORDER — RIVAROXABAN 15 MG PO TABS
15.0000 mg | ORAL_TABLET | Freq: Two times a day (BID) | ORAL | Status: DC
  Administered 2014-02-01 – 2014-02-02 (×2): 15 mg via ORAL
  Filled 2014-02-01 (×4): qty 1

## 2014-02-01 MED ORDER — POTASSIUM CHLORIDE CRYS ER 20 MEQ PO TBCR
40.0000 meq | EXTENDED_RELEASE_TABLET | Freq: Once | ORAL | Status: AC
  Administered 2014-02-01: 40 meq via ORAL
  Filled 2014-02-01: qty 2

## 2014-02-01 MED ORDER — ENALAPRIL MALEATE 10 MG PO TABS
10.0000 mg | ORAL_TABLET | Freq: Every day | ORAL | Status: DC
  Administered 2014-02-01 – 2014-02-02 (×2): 10 mg via ORAL
  Filled 2014-02-01 (×2): qty 1

## 2014-02-01 MED ORDER — ENSURE COMPLETE PO LIQD
237.0000 mL | Freq: Two times a day (BID) | ORAL | Status: DC
  Administered 2014-02-02: 237 mL via ORAL

## 2014-02-01 MED ORDER — DARUNAVIR-COBICISTAT 800-150 MG PO TABS
1.0000 | ORAL_TABLET | Freq: Every day | ORAL | Status: DC
  Administered 2014-02-02: 1 via ORAL

## 2014-02-01 MED ORDER — RIVAROXABAN 15 MG PO TABS
15.0000 mg | ORAL_TABLET | Freq: Two times a day (BID) | ORAL | Status: DC
Start: 2014-02-01 — End: 2014-02-01
  Filled 2014-02-01 (×2): qty 1

## 2014-02-01 NOTE — Progress Notes (Addendum)
Pharmacy Consult - Xarelto  Transitioning from Lovenox to Xarelto for PE CBC stable CrCl > 30 ml/min  Plan: 1) Xarelto 15 mg po BID x 3 weeks then 20 mg daily 2) Xarelto does interact with his protease inhibitor causing an significant increase in Xarelto concentrations -- MD is are. 3) Counseled patient regarding bleeding risk  Thank you. Anette Guarneri, PharmD [phone]

## 2014-02-01 NOTE — Progress Notes (Signed)
INITIAL NUTRITION ASSESSMENT  DOCUMENTATION CODES Per approved criteria  -Severe malnutrition in the context of acute illness or injury   INTERVENTION:  Recommend liberalize diet to regular.  Ensure Complete PO BID, each supplement provides 350 kcal and 13 grams of protein  NUTRITION DIAGNOSIS: Malnutrition related to inadequate oral intake as evidenced by 6% weight loss in one month with moderate depletion of muscle mass.   Goal: Intake to meet >90% of estimated nutrition needs.  Monitor:  PO intake, labs, weight trend.  Reason for Assessment: MST  58 y.o. male  Admitting Dx: Dyspnea, Pulmonary embolism  ASSESSMENT: Patient is a 58 y.o. year old male with noted prior history of HIV or HAART therapy will control with recent CD4 count 700 and undetectable viral load, hypertension, GERD presented with dyspnea and PE.   Patient reports poor oral intake for the past month due to poor appetite since shoulder surgery in March associated with 10 lb weight loss. He can tell that his strength has decreased. Agreed to try Ensure supplements to maximize oral intake.   Nutrition Focused Physical Exam:  Subcutaneous Fat:  Orbital Region: mild depletion Upper Arm Region: WNL Thoracic and Lumbar Region: WNL  Muscle:  Temple Region: mild depletion Clavicle Bone Region: moderate depletion Clavicle and Acromion Bone Region: moderate depletion Scapular Bone Region: WNL Dorsal Hand: WNL Patellar Region: moderate depletion Anterior Thigh Region: moderate depletion Posterior Calf Region: moderate depletion  Edema: none  Pt meets criteria for severe MALNUTRITION in the context of acute illness as evidenced by 6% weight loss in one month with moderate depletion of muscle mass.  Height: Ht Readings from Last 1 Encounters:  02/01/14 5\' 5"  (1.651 m)    Weight: Wt Readings from Last 1 Encounters:  02/01/14 168 lb (76.204 kg)    Ideal Body Weight: 61.8 kg  % Ideal Body Weight:  123%  Wt Readings from Last 10 Encounters:  02/01/14 168 lb (76.204 kg)  12/15/13 179 lb (81.194 kg)  12/15/13 179 lb (81.194 kg)  05/19/13 170 lb (77.111 kg)  04/22/13 170 lb (77.111 kg)  04/12/13 170 lb (77.111 kg)  03/29/13 170 lb (77.111 kg)  02/24/13 170 lb (77.111 kg)  10/13/12 170 lb (77.111 kg)  07/07/11 176 lb 6.4 oz (80.015 kg)    Usual Body Weight: 179 lb  % Usual Body Weight: 94%  BMI:  Body mass index is 27.96 kg/(m^2).  Estimated Nutritional Needs: Kcal: 1900-2100 Protein: 95-110 gm Fluid: > 2 L  Skin: no wounds  Diet Order: NPO  EDUCATION NEEDS: -Education needs addressed; discussed ways to increase protein and calorie intake at home.   Intake/Output Summary (Last 24 hours) at 02/01/14 1410 Last data filed at 02/01/14 0913  Gross per 24 hour  Intake    240 ml  Output    600 ml  Net   -360 ml    Last BM: 4/29   Labs:   Recent Labs Lab 01/31/14 1923 02/01/14 0133 02/01/14 0237  NA 133* 134*  --   K 3.0* 3.4*  --   CL 90* 94*  --   CO2 27 28  --   BUN 13 10  --   CREATININE 0.97 0.89  --   CALCIUM 9.2 8.7  --   MG  --   --  1.9  GLUCOSE 89 117*  --     CBG (last 3)  No results found for this basename: GLUCAP,  in the last 72 hours  Scheduled Meds: .  ALPRAZolam  1 mg Oral TID  . atorvastatin  10 mg Oral Daily  . [START ON 02/02/2014] darunavir-cobicistat  1 tablet Oral Q breakfast  . [START ON 02/02/2014] dolutegravir  50 mg Oral QAC breakfast  . emtricitabine-tenofovir  1 tablet Oral Daily  . enalapril  10 mg Oral Daily   And  . hydrochlorothiazide  25 mg Oral Daily  . omega-3 acid ethyl esters  2 capsule Oral Daily  . pantoprazole  40 mg Oral Daily  . Rivaroxaban  15 mg Oral BID WC  . valACYclovir  500 mg Oral Daily    Continuous Infusions:   Past Medical History  Diagnosis Date  . HTN (hypertension)   . Hyperlipidemia   . HIV (human immunodeficiency virus infection)   . GERD (gastroesophageal reflux disease)      Past Surgical History  Procedure Laterality Date  . Rotator cuff surgery      right and left  . Laminectomy  2010    C4/C5  . Appendectomy    . Tonsillectomy    . Colonoscopy    . Shoulder arthroscopy with subacromial decompression Right 12/15/2013    Procedure: RIGHT SHOULDER ARTHROSCOPY WITH EXTENSIVE DEBRIDEMENT OF ROTATOR CUFF,SUBACROMIAL DECOMPRESSION, DEBRIDMENT OF BURSA;  Surgeon: Ninetta Lights, MD;  Location: [facility];  Service: Orthopedics;  Laterality: Right;     Molli Barrows, RD, LDN, [place] Pager [phone] After Hours Pager [phone]

## 2014-02-01 NOTE — Progress Notes (Signed)
[facility] PROGRESS NOTE  Michael [name] DDU:[id number] DOB: 1956/08/17 DOA: 01/31/2014 PCP: Michael Hare, MD  Brief Narrative I've seen and examined Mr. Michael [name], and reviewed his chart at bedside in the presence of his partner. This is a pleasant 58 y.o. year old male with HIV on HAART therapy with recent CD4 count of 700 and undetectable viral load, hypertension, GERD, who presented with dyspnea and was found to have acute bilateral PE and possibly pneumonia/atelectasis with right sided pleural effusion. Patient had right shoulder arthroscopy about 2 weeks ago for chronic shoulder pain. This was probably the precipitant of the acute PE which patient has not had previously. Patient also mentions that his father had DVT. Wonder if there is a genetic component which is being unmasked just now. CT of the pelvis also showed "2. Increased density right lung base differential considerations infiltrate versus atelectasis versus lung infarction 3. Indeterminate 2.4 cm nodule right lobe of the liver further evaluation with dedicated liver protocol MRI is recommended, considering the findings within the lungs differential considerations are primary versus metastatic neoplastic disease 4. Small right pleural effusion". Patient was started on Lovenox and will be transitioned to Xarelto which he prefers to Coumadin. He was also placed on broad-spectrum antibiotics which will be switched to oral today. He reports feeling better. We will obtain MRCP to better evaluate the abnormal CT findings. Patient would rather be home but we will monitor him overnight and consider discharge in the morning if he continues to do well on Xarelto. Will obtain hypocoagulable screen given the fact that the father also at DVT. Plan PE (pulmonary embolism)/Pneumonia/atelectasis/right pleural effusion  Switch to Xarelto per pharmacy to complete 3 months of anticoagulation unless predisposition to blood clots will need lifelong under  coagulation  Change antibiotics to oral  Albuterol as needed  Incentive spirometer  Hypocoagulable [place] LIVER HEMANGIOMA  MRCP HYPERLIPIDEMIA/DEPRESSION/GASTROESOPHAGEAL REFLUX DISEASE  No acute changes  HIV disease  No acute changes  Continue antiretrovirals DVT/GI prophylaxis  On Xarelto Code Status: Full Code Family Communication: Spoke with patient's blood night bedside Disposition Plan: Eventually home hopefully in the next 24 hours   Consultants:  None  Procedures: None Antibiotics:  Ceftriaxone 01/31/14>02/01/14  Azithromycin 01/31/14>02/01/14  HPI/Subjective: Feels better.  Objective: Filed Vitals:   02/01/14 1326  BP: 130/81  Pulse: 92  Temp: 97.2 F (36.2 C)  Resp: 16    Intake/Output Summary (Last 24 hours) at 02/01/14 1400 Last data filed at 02/01/14 0913  Gross per 24 hour  Intake    240 ml  Output    600 ml  Net   -360 ml   Filed Weights   01/31/14 1901 02/01/14 0507  Weight: 76.204 kg (168 lb) 76.204 kg (168 lb)    Exam:   General:  Comfortable at rest  Cardiovascular: S1,S2, No murmurs. RRR.  Respiratory: Bibasilar rhonchi  Abdomen: Soft, nontender and positive bowel sounds  Musculoskeletal: No pedal edema  Data Reviewed: Basic Metabolic Panel:  Recent Labs Lab 01/31/14 1923 02/01/14 0133 02/01/14 0237  NA 133* 134*  --   K 3.0* 3.4*  --   CL 90* 94*  --   CO2 27 28  --   GLUCOSE 89 117*  --   BUN 13 10  --   CREATININE 0.97 0.89  --   CALCIUM 9.2 8.7  --   MG  --   --  1.9   Liver Function Tests:  Recent Labs Lab 01/31/14 1923 02/01/14 0133  AST  34 26  ALT 76* 60*  ALKPHOS 75 63  BILITOT 0.5 0.3  PROT 7.4 6.4  ALBUMIN 2.7* 2.3*   No results found for this basename: LIPASE, AMYLASE,  in the last 168 hours No results found for this basename: AMMONIA,  in the last 168 hours CBC:  Recent Labs Lab 01/31/14 1923 02/01/14 0133  WBC 11.0* 8.2  NEUTROABS 8.2* 5.8  HGB 14.0 12.0*  HCT 38.6*  33.3*  MCV 92.6 92.8  PLT 332 299   Cardiac Enzymes: No results found for this basename: CKTOTAL, CKMB, CKMBINDEX, TROPONINI,  in the last 168 hours BNP (last 3 results) No results found for this basename: PROBNP,  in the last 8760 hours CBG: No results found for this basename: GLUCAP,  in the last 168 hours  No results found for this or any previous visit (from the past 240 hour(s)).   Studies: Dg Chest 2 View  01/31/2014   CLINICAL DATA:  COUGH/ SOB/ ? COLLPASED LUNG /PNEUMONIA  EXAM: CHEST  2 VIEW  COMPARISON:  None.  FINDINGS: The cardiac silhouette is within normal limits. Increased consolidated density right lung base. There is blunting of the right costophrenic angle. On the lateral view the area within the right lung base has the appearance of an a focal eventration versus diaphragmatic herniation, mass cannot be excluded. The aerated right lung base demonstrates ill-defined density. Left lung is clear. No acute osseous abnormalities.  IMPRESSION: Consolidative density right lung base differential considerations are consolidative infiltrate, rounded atelectasis, diaphragmatic eventration versus herniation, or possibly a mass. Further evaluation with chest CT is recommended. There also findings within the aerated lung in the right lung base consistent with atelectasis versus airspace disease as well as a small right effusion.   Electronically Signed   By: Margaree Mackintosh M.D.   On: 01/31/2014 15:52   Ct Chest W Contrast  01/31/2014   CLINICAL DATA:  Cough/sob/no breath sounds RLL  EXAM: CT CHEST WITH CONTRAST  TECHNIQUE: Multidetector CT imaging of the chest was performed during intravenous contrast administration.  CONTRAST:  41mL OMNIPAQUE IOHEXOL 300 MG/ML  SOLN  COMPARISON:  DG CHEST 2 VIEW dated 01/31/2014  FINDINGS: The thoracic inlet is unremarkable.  There is no evidence of mediastinal masses nor adenopathy. A filling defect is appreciated within the distal left main pulmonary artery  with extension into the left lower lobe branch. A filling defect is also appreciated within the distal right main pulmonary artery with extension into the lower lobe branch. RV/LV ratio 0.74 This is not a pulmonary embolus protocol and evaluation of the remaining portions of the pulmonary arterial system is limited. There is no evidence of a thoracic aortic aneurysm.  There is increased density within the right hilar region a component of which is the filling defect within the pulmonary artery. A small mass or adenopathy within this area cannot be excluded.  A small right pleural effusion is appreciated. There is also diffuse increased density in the dependent portion of the right lung base. Linear areas of increased density appreciated within the right lung base.  There is focal eventration of the right hemidiaphragm containing intra-abdominal fat and portions of the liver. This finding corresponds to the density on the lateral chest radiograph.  Linear areas of atelectasis versus scarring are appreciated in the left lung base.  The central airways are patent.  Evaluation of visualized upper abdominal viscera demonstrates a 2.4 cm low attenuating nodule within the posterior aspect of the right lobe of  the liver image 57 series 2. Remaining visualized upper abdominal viscera are unremarkable.  There is no evidence of aggressive appearing osseous lesions.  IMPRESSION: 1. Bilateral pulmonary emboli. Critical Value/emergent results were called by telephone at the time of interpretation on 01/31/2014 at 6:46 PM to Dr. Vivi Martens, who verbally acknowledged these results. 2. Increased density right lung base differential considerations infiltrate versus atelectasis versus lung infarction 3. Indeterminate 2.4 cm nodule right lobe of the liver further evaluation with dedicated liver protocol MRI is recommended, considering the findings within the lungs differential considerations are primary versus metastatic neoplastic  disease 4. Small right pleural effusion   Electronically Signed   By: Margaree Mackintosh M.D.   On: 01/31/2014 18:48    Scheduled Meds: . ALPRAZolam  1 mg Oral TID  . atorvastatin  10 mg Oral Daily  . [START ON 02/02/2014] darunavir-cobicistat  1 tablet Oral Q breakfast  . [START ON 02/02/2014] dolutegravir  50 mg Oral QAC breakfast  . emtricitabine-tenofovir  1 tablet Oral Daily  . enalapril  10 mg Oral Daily   And  . hydrochlorothiazide  25 mg Oral Daily  . omega-3 acid ethyl esters  2 capsule Oral Daily  . pantoprazole  40 mg Oral Daily  . Rivaroxaban  15 mg Oral BID WC  . valACYclovir  500 mg Oral Daily   Continuous Infusions:    Esmond Hinch  [facility] Pager [phone]. If 7PM-7AM, please contact night-coverage at [email or web address], password [facility] 02/01/2014, 2:00 PM  LOS: 1 day

## 2014-02-01 NOTE — Progress Notes (Signed)
  Pt admitted to the unit. Pt is stable, alert and oriented per baseline. Oriented to room, staff, and call bell. Educated to call for any assistance. Bed in lowest position, call bell within reach- will continue to monitor. 

## 2014-02-01 NOTE — Progress Notes (Signed)
UR Completed.  Khiara Shuping Jane Syona Wroblewski [phone] 02/01/2014  

## 2014-02-01 NOTE — Progress Notes (Signed)
Bilateral lower extremity venous duplex:  No evidence of DVT, superficial thrombosis, or Baker's cyst.   

## 2014-02-02 LAB — CBC WITH DIFFERENTIAL/PLATELET
Basophils Absolute: 0 10*3/uL (ref 0.0–0.1)
Basophils Relative: 0 % (ref 0–1)
EOS ABS: 0.1 10*3/uL (ref 0.0–0.7)
Eosinophils Relative: 1 % (ref 0–5)
HCT: 36.9 % — ABNORMAL LOW (ref 39.0–52.0)
Hemoglobin: 12.8 g/dL — ABNORMAL LOW (ref 13.0–17.0)
LYMPHS ABS: 1.6 10*3/uL (ref 0.7–4.0)
LYMPHS PCT: 21 % (ref 12–46)
MCH: 32.6 pg (ref 26.0–34.0)
MCHC: 34.7 g/dL (ref 30.0–36.0)
MCV: 93.9 fL (ref 78.0–100.0)
Monocytes Absolute: 0.6 10*3/uL (ref 0.1–1.0)
Monocytes Relative: 8 % (ref 3–12)
Neutro Abs: 5.5 10*3/uL (ref 1.7–7.7)
Neutrophils Relative %: 70 % (ref 43–77)
PLATELETS: 338 10*3/uL (ref 150–400)
RBC: 3.93 MIL/uL — AB (ref 4.22–5.81)
RDW: 12 % (ref 11.5–15.5)
WBC: 7.8 10*3/uL (ref 4.0–10.5)

## 2014-02-02 LAB — COMPREHENSIVE METABOLIC PANEL
ALBUMIN: 2.5 g/dL — AB (ref 3.5–5.2)
ALT: 48 U/L (ref 0–53)
AST: 20 U/L (ref 0–37)
Alkaline Phosphatase: 62 U/L (ref 39–117)
BILIRUBIN TOTAL: 0.4 mg/dL (ref 0.3–1.2)
BUN: 11 mg/dL (ref 6–23)
CO2: 28 mEq/L (ref 19–32)
CREATININE: 0.96 mg/dL (ref 0.50–1.35)
Calcium: 9 mg/dL (ref 8.4–10.5)
Chloride: 94 mEq/L — ABNORMAL LOW (ref 96–112)
GFR calc Af Amer: >90 mL/min (ref >90)
GFR calc non Af Amer: 89 mL/min — ABNORMAL LOW (ref >90)
Glucose, Bld: 103 mg/dL — ABNORMAL HIGH (ref 70–99)
POTASSIUM: 3.6 meq/L — AB (ref 3.7–5.3)
Sodium: 136 mEq/L — ABNORMAL LOW (ref 137–147)
TOTAL PROTEIN: 6.7 g/dL (ref 6.0–8.3)

## 2014-02-02 LAB — BETA-2-GLYCOPROTEIN I ABS, IGG/M/A
BETA 2 GLYCO I IGG: 6 G Units (ref <20)
BETA-2-GLYCOPROTEIN I IGM: 18 M Units (ref <20)
Beta-2-Glycoprotein I IgA: 46 A Units — ABNORMAL HIGH (ref <20)

## 2014-02-02 LAB — CARDIOLIPIN ANTIBODIES, IGG, IGM, IGA
ANTICARDIOLIPIN IGA: 7 U/mL — AB (ref <22)
ANTICARDIOLIPIN IGG: 3 GPL U/mL — AB (ref <23)
ANTICARDIOLIPIN IGM: 7 [MPL'U]/mL — AB (ref <11)

## 2014-02-02 LAB — HOMOCYSTEINE: HOMOCYSTEINE-NORM: 8.6 umol/L (ref 4.0–15.4)

## 2014-02-02 LAB — PROTEIN S ACTIVITY: Protein S Activity: 147 % — ABNORMAL HIGH (ref 69–129)

## 2014-02-02 LAB — LUPUS ANTICOAGULANT PANEL
DRVVT: 46.3 s — AB (ref <42.9)
LUPUS ANTICOAGULANT: DETECTED — AB
PTT Lupus Anticoagulant: 45.6 secs — ABNORMAL HIGH (ref 28.0–43.0)
PTTLA 41 MIX: 43.8 s — AB (ref 28.0–43.0)
PTTLA CONFIRMATION: 12.7 s — AB (ref <8.0)
dRVVT Incubated 1:1 Mix: 39 secs (ref <42.9)

## 2014-02-02 LAB — HIV-1 RNA QUANT-NO REFLEX-BLD
HIV 1 RNA Quant: <20 copies/mL (ref <20)
HIV-1 RNA Quant, Log: <1.3 {Log} (ref <1.30)

## 2014-02-02 LAB — T-HELPER CELLS (CD4) COUNT (NOT AT ARMC)
CD4 % Helper T Cell: 34 % (ref 33–55)
CD4 T CELL ABS: 630 /uL (ref 400–2700)

## 2014-02-02 LAB — PROTEIN C ACTIVITY: Protein C Activity: 153 % — ABNORMAL HIGH (ref 75–133)

## 2014-02-02 MED ORDER — RIVAROXABAN (XARELTO) EDUCATION KIT FOR DVT/PE PATIENTS
PACK | Freq: Once | Status: AC
  Administered 2014-02-02: 11:00:00
  Filled 2014-02-02: qty 1

## 2014-02-02 MED ORDER — DABIGATRAN ETEXILATE MESYLATE 150 MG PO CAPS
150.0000 mg | ORAL_CAPSULE | Freq: Two times a day (BID) | ORAL | Status: DC
Start: 2014-02-02 — End: 2014-02-16

## 2014-02-02 MED ORDER — ENOXAPARIN SODIUM 40 MG/0.4ML ~~LOC~~ SOLN: 80.0000 mg | Freq: Two times a day (BID) | SUBCUTANEOUS | Status: DC

## 2014-02-02 MED ORDER — POTASSIUM CHLORIDE 20 MEQ/15ML (10%) PO LIQD
40.0000 meq | Freq: Once | ORAL | Status: AC
  Administered 2014-02-02: 40 meq via ORAL
  Filled 2014-02-02: qty 30

## 2014-02-02 MED ORDER — ENOXAPARIN SODIUM 80 MG/0.8ML ~~LOC~~ SOLN
80.0000 mg | Freq: Two times a day (BID) | SUBCUTANEOUS | Status: DC
  Filled 2014-02-02 (×2): qty 0.8

## 2014-02-02 MED ORDER — LEVOFLOXACIN 500 MG PO TABS: 500.0000 mg | ORAL_TABLET | Freq: Every day | ORAL | Status: DC

## 2014-02-02 MED ORDER — ENSURE COMPLETE PO LIQD: 237.0000 mL | Freq: Two times a day (BID) | ORAL | Status: DC

## 2014-02-02 NOTE — ED Provider Notes (Signed)
Medical screening examination/treatment/procedure(s) were performed by non-physician practitioner and as supervising physician I was immediately available for consultation/collaboration.  Lannis Lichtenwalner T Micole Delehanty, MD 02/02/14 2322 

## 2014-02-02 NOTE — Discharge Summary (Addendum)
Michael [name], is a 58 y.o. male  DOB [birth year]  MRN [id number].  Admission date:  01/31/2014  Admitting Physician  Shanda Howells, MD  Discharge Date:  02/02/2014   Primary MD  Adella Hare, MD  Recommendations for primary care physician for things to follow:  Please follow resolution pneumonia and have patient follow ID. please also follow hypocoagulable panel results.   Admission Diagnosis  Hypokalemia [276.8] HIV disease [042] CAP (community acquired pneumonia) [486] Pulmonary embolism, bilateral [415.19]   Discharge Diagnosis  Hypokalemia [276.8] HIV disease [042] CAP (community acquired pneumonia) [486] Pulmonary embolism, bilateral [415.19]    Active Problems:   PE (pulmonary embolism)   Pneumonia   LIVER HEMANGIOMA   HYPERLIPIDEMIA   DEPRESSION   GASTROESOPHAGEAL REFLUX DISEASE   HIV disease   Pulmonary embolism   Protein-calorie malnutrition, severe      Past Medical History  Diagnosis Date  . HTN (hypertension)   . Hyperlipidemia   . HIV (human immunodeficiency virus infection)   . GERD (gastroesophageal reflux disease)     Past Surgical History  Procedure Laterality Date  . Rotator cuff surgery      right and left  . Laminectomy  2010    C4/C5  . Appendectomy    . Tonsillectomy    . Colonoscopy    . Shoulder arthroscopy with subacromial decompression Right 12/15/2013    Procedure: RIGHT SHOULDER ARTHROSCOPY WITH EXTENSIVE DEBRIDEMENT OF ROTATOR CUFF,SUBACROMIAL DECOMPRESSION, DEBRIDMENT OF BURSA;  Surgeon: Ninetta Lights, MD;  Location: [facility];  Service: Orthopedics;  Laterality: Right;     Discharge Condition: Stable   Follow UP Hypercoagulable panel     Discharge Instructions  and  Discharge Medications        Discharge Orders   Future Orders Complete By  Expires   Diet - low sodium heart healthy  As directed    Increase activity slowly  As directed        Medication List         ALPRAZolam 3 MG 24 hr tablet  Commonly known as:  XANAX XR  Take 3 mg by mouth daily.     atorvastatin 10 MG tablet  Commonly known as:  LIPITOR  Take 10 mg by mouth daily.     BECONASE AQ 42 MCG/SPRAY nasal spray  Generic drug:  beclomethasone  Place 1 spray into the nose daily as needed for allergies.     dabigatran 150 MG Caps capsule  Commonly known as:  PRADAXA  Take 1 capsule (150 mg total) by mouth 2 (two) times daily.     emtricitabine-tenofovir 200-300 MG per tablet  Commonly known as:  TRUVADA  Take 1 tablet by mouth daily.     enalapril-hydrochlorothiazide 10-25 MG per tablet  Commonly known as:  VASERETIC  Take 1 tablet by mouth daily.     enoxaparin 40 MG/0.4ML injection  Commonly known as:  LOVENOX  Inject 0.8 mLs (80 mg total) into the skin every 12 (twelve) hours.  esomeprazole 40 MG capsule  Commonly known as:  NEXIUM  Take 20 mg by mouth daily.     feeding supplement (ENSURE COMPLETE) Liqd  Take 237 mLs by mouth 2 (two) times daily between meals.     levofloxacin 500 MG tablet  Commonly known as:  LEVAQUIN  Take 1 tablet (500 mg total) by mouth daily.     LOVAZA 1 G capsule  Generic drug:  omega-3 acid ethyl esters  Take 2 capsules by mouth daily.     oxyCODONE-acetaminophen 5-325 MG per tablet  Commonly known as:  ROXICET  Take 1-2 tablets by mouth every 4 (four) hours as needed.     PREZCOBIX 800-150 MG per tablet  Generic drug:  darunavir-cobicistat  Take 1 tablet by mouth daily. Swallow whole. Do NOT crush, break or chew tablets. Take with food.     TIVICAY 50 MG tablet  Generic drug:  dolutegravir  Take 50 mg by mouth daily.     valACYclovir 500 MG tablet  Commonly known as:  VALTREX  Take 500 mg by mouth daily.          Diet and Activity recommendation: See Discharge Instructions  above   Consults obtained - Dr Rudi Coco, [place] from [facility].   Major procedures and Radiology Reports - PLEASE review detailed and final reports for all details, in brief -    Dg Chest 2 View  01/31/2014   CLINICAL DATA:  COUGH/ SOB/ ? COLLPASED LUNG /PNEUMONIA  EXAM: CHEST  2 VIEW  COMPARISON:  None.  FINDINGS: The cardiac silhouette is within normal limits. Increased consolidated density right lung base. There is blunting of the right costophrenic angle. On the lateral view the area within the right lung base has the appearance of an a focal eventration versus diaphragmatic herniation, mass cannot be excluded. The aerated right lung base demonstrates ill-defined density. Left lung is clear. No acute osseous abnormalities.  IMPRESSION: Consolidative density right lung base differential considerations are consolidative infiltrate, rounded atelectasis, diaphragmatic eventration versus herniation, or possibly a mass. Further evaluation with chest CT is recommended. There also findings within the aerated lung in the right lung base consistent with atelectasis versus airspace disease as well as a small right effusion.   Electronically Signed   By: Margaree Mackintosh M.D.   On: 01/31/2014 15:52   Ct Chest W Contrast  01/31/2014   CLINICAL DATA:  Cough/sob/no breath sounds RLL  EXAM: CT CHEST WITH CONTRAST  TECHNIQUE: Multidetector CT imaging of the chest was performed during intravenous contrast administration.  CONTRAST:  57mL OMNIPAQUE IOHEXOL 300 MG/ML  SOLN  COMPARISON:  DG CHEST 2 VIEW dated 01/31/2014  FINDINGS: The thoracic inlet is unremarkable.  There is no evidence of mediastinal masses nor adenopathy. A filling defect is appreciated within the distal left main pulmonary artery with extension into the left lower lobe branch. A filling defect is also appreciated within the distal right main pulmonary artery with extension into the lower lobe branch. RV/LV ratio 0.74 This is not a pulmonary embolus protocol and  evaluation of the remaining portions of the pulmonary arterial system is limited. There is no evidence of a thoracic aortic aneurysm.  There is increased density within the right hilar region a component of which is the filling defect within the pulmonary artery. A small mass or adenopathy within this area cannot be excluded.  A small right pleural effusion is appreciated. There is also diffuse increased density in the dependent portion of the right lung  base. Linear areas of increased density appreciated within the right lung base.  There is focal eventration of the right hemidiaphragm containing intra-abdominal fat and portions of the liver. This finding corresponds to the density on the lateral chest radiograph.  Linear areas of atelectasis versus scarring are appreciated in the left lung base.  The central airways are patent.  Evaluation of visualized upper abdominal viscera demonstrates a 2.4 cm low attenuating nodule within the posterior aspect of the right lobe of the liver image 57 series 2. Remaining visualized upper abdominal viscera are unremarkable.  There is no evidence of aggressive appearing osseous lesions.  IMPRESSION: 1. Bilateral pulmonary emboli. Critical Value/emergent results were called by telephone at the time of interpretation on 01/31/2014 at 6:46 PM to Dr. Vivi Martens, who verbally acknowledged these results. 2. Increased density right lung base differential considerations infiltrate versus atelectasis versus lung infarction 3. Indeterminate 2.4 cm nodule right lobe of the liver further evaluation with dedicated liver protocol MRI is recommended, considering the findings within the lungs differential considerations are primary versus metastatic neoplastic disease 4. Small right pleural effusion   Electronically Signed   By: Margaree Mackintosh M.D.   On: 01/31/2014 18:48   Mr Liver W Wo Contrast  02/02/2014   CLINICAL DATA:  Liver lesions on CT.  Pulmonary emboli.  EXAM: MRI ABDOMEN WITHOUT  AND WITH CONTRAST  TECHNIQUE: Multiplanar, multisequence MR imaging was performed both before and after administration of intravenous contrast.  CONTRAST:  60mL MULTIHANCE GADOBENATE DIMEGLUMINE 529 MG/ML IV SOLN  COMPARISON:  01/31/2014 Chest CT. Report of an abdominal CT of 09/17/2000 -this described 2 liver lesions, 1 measuring 2.5 cm and the other measuring 2.2 cm.  FINDINGS: Pulmonary emboli, including on images 37 and 42 of series 12. Complex right-sided pleural effusion with adjacent right base airspace disease.  No evidence of cirrhosis.  Four liver lesions. A lateral segment left liver lobe 2.8 cm lesion demonstrates moderate T2 hyperintensity on image 16/series 5. Mild post-contrast peripheral enhancement, without definite nodular component. Example image 26/ series 1104. Possible nodular enhancement on image 52/series 12. This lesion is technically indeterminate.  Posterior segment right liver lobe 2.5 cm lesion which demonstrates a lobulated morphology and moderate T2 hyperintensity on image 31/ series 5. Demonstrates peripheral nodular enhancement, including on image 54/series 11002. This is most consistent with a hemangioma.  Smaller lesions within the lateral segment left liver lobe and anterior segment right liver lobe demonstrate marked T2 hyperintensity on image 15/series 6. Relatively occult after contrast, only vaguely apparent on image 36/series 11002. Favored to represent small hemangiomas, with delayed contrast fill.  Hepatomegaly, 20.9 cm craniocaudal. Normal spleen, stomach, pancreas, gallbladder, biliary tract, adrenal glands. Tiny left renal cyst. Normal abdominal bowel loops, without ascites or adenopathy.  IMPRESSION: 1. Four liver lesions. Three of these lesions are consistent with hemangiomas. A fourth lesion is technically indeterminate. 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 If the patient is initially diagnosed with a primary malignancy, PET may be informative. If not, follow-up with pre and post can't MRI at 3 months is suggested. 2. Bilateral pulmonary emboli, with complex right pleural effusion and adjacent airspace disease. Please see CT for further discussion. 3. Hepatomegaly.   Electronically Signed   By: Abigail Miyamoto M.D.   On: 02/02/2014 09:37     No results found for this or any previous visit (from the past 240  hour(s)).   History of present illness and  Hospital Course:     Kindly see H&P for history of present illness and admission details, please review complete Labs, Consult reports and Test reports for all details in brief, Michael [name], is a pleasant 58 y.o. year old male with HIV on HAART therapy with recent CD4 count of 700 and undetectable viral load, hypertension, GERD, who presented with dyspnea and was found to have acute bilateral PE and possibly pneumonia/atelectasis with right sided pleural effusion. Patient had right shoulder arthroscopy about 2 weeks ago for chronic shoulder pain. This was probably the precipitant of the acute PE which patient has not had previously. Patient also mentions that his father had DVT. Wonder if there is a genetic component which is being unmasked just now. CT of the pelvis also showed "2. Increased density right lung base differential considerations infiltrate versus atelectasis versus lung infarction 3. Indeterminate 2.4 cm nodule right lobe of the liver further evaluation with dedicated liver protocol MRI is recommended, considering the findings within the lungs differential considerations are primary versus metastatic neoplastic disease 4. Small right pleural effusion". Patient was started on Lovenox and was then transitioned to Xarelto which he preferred to Coumadin. After discussion with the patient's ID physician at [facility] Dr. Rudi Coco, I discharged patient on Lovenox which has less interaction concerns with  HAART, and Dr Rudi Coco will figure out the oral alternative. Hypercoagulable panel is pending at the time of discharge. He needs anticoagulation for 3 months. He was also placed on broad-spectrum antibiotics and he will be discharged on Levaquin for 6 more days.  MRCP suggest liver hemangiomas which will need follow up. His father had DVT at age 62 and this raises concern full and genetic component, therefore the need to follow her hypercoagulable panel in case patient may need long term anticoagulation. He is discharged in stable condition and to follow with his PCP in one week and with his ID physician as scheduled.   Today   Subjective:   Odette Horns today has no headache,no chest abdominal pain,no new weakness tingling or numbness, feels much better wants to go home today.  Objective:   Blood pressure 128/75, pulse 69, temperature 98.4 F (36.9 C), temperature source Oral, resp. rate 18, height 5\' 5"  (1.651 m), weight 76.204 kg (168 lb), SpO2 95.00%.  No intake or output data in the 24 hours ending 02/02/14 1326  Exam Awake Alert, Oriented *3, No new F.N deficits, Normal affect .AT,PERRAL Supple Neck,No JVD, No cervical lymphadenopathy appriciated.  Symmetrical Chest wall movement, Good air movement bilaterally, CTAB RRR,No Gallops,Rubs or new Murmurs, No Parasternal Heave +ve B.Sounds, Abd Soft, Non tender, No organomegaly appriciated, No rebound -guarding or rigidity. No Cyanosis, Clubbing or edema, No new Rash or bruise  Data Review   CBC w Diff:  Lab Results  Component Value Date   WBC 7.8 02/02/2014   HGB 12.8* 02/02/2014   HCT 36.9* 02/02/2014   PLT 338 02/02/2014   LYMPHOPCT 21 02/02/2014   MONOPCT 8 02/02/2014   EOSPCT 1 02/02/2014   BASOPCT 0 02/02/2014    CMP:  Lab Results  Component Value Date   NA 136* 02/02/2014   K 3.6* 02/02/2014   CL 94* 02/02/2014   CO2 28 02/02/2014   BUN 11 02/02/2014   CREATININE 0.96 02/02/2014   PROT 6.7 02/02/2014   ALBUMIN 2.5* 02/02/2014    BILITOT 0.4 02/02/2014   ALKPHOS 62 02/02/2014   AST 20 02/02/2014  ALT 48 02/02/2014  .   Total Time in preparing paper work, data evaluation and todays exam - 35 minutes  Nat Math M.D on 02/02/2014 at [facility]  [phone]

## 2014-02-02 NOTE — Care Management Note (Signed)
    Page 1 of 1   02/02/2014     2:13:50 PM CARE MANAGEMENT NOTE 02/02/2014  Patient:  Michael [name], Michael [name]   Account Number:  [id number]  Date Initiated:  02/02/2014  Documentation initiated by:  Tomi Bamberger  Subjective/Objective Assessment:   dx pe  admit-lives with partner.     Action/Plan:   xarelto   Anticipated DC Date:  02/02/2014   Anticipated DC Plan:  HOME/SELF CARE      DC Planning Services  CM consult      Choice offered to / List presented to:             Status of service:  Completed, signed off Medicare Important Message given?   (If response is "NO", the following Medicare IM given date fields will be blank) Date Medicare IM given:   Date Additional Medicare IM given:    Discharge Disposition:  HOME/SELF CARE  Per UR Regulation:  Reviewed for med. necessity/level of care/duration of stay  If discussed at [place] of Stay Meetings, dates discussed:    Comments:  02/02/14 [place], BSN [phone] patient lives with partner, patient will be dc today on xarelto ,which is new for patient, NCM awaiting benefits check to see what co pay would be and if any preauthorization is needed.  Patient used [employer].  Patient states attending will be speaking with patient's MD regarding effects of xarelto with 042, it may cause increase bleeding, so not sure if he will go home with xarelto, but Pharmacy has given patient a kit with free 30 day trial card.  MD has decided to put patient on lovenox after speaking with patient's [facility] MD.  NCM awaiting benfits check  for lovenox.

## 2014-02-02 NOTE — Progress Notes (Signed)
Michael [name] to be D/C'd Home per MD order.  Discussed with the patient and all questions fully answered.    Medication List         ALPRAZolam 3 MG 24 hr tablet  Commonly known as:  XANAX XR  Take 3 mg by mouth daily.     atorvastatin 10 MG tablet  Commonly known as:  LIPITOR  Take 10 mg by mouth daily.     BECONASE AQ 42 MCG/SPRAY nasal spray  Generic drug:  beclomethasone  Place 1 spray into the nose daily as needed for allergies.     dabigatran 150 MG Caps capsule  Commonly known as:  PRADAXA  Take 1 capsule (150 mg total) by mouth 2 (two) times daily.     emtricitabine-tenofovir 200-300 MG per tablet  Commonly known as:  TRUVADA  Take 1 tablet by mouth daily.     enalapril-hydrochlorothiazide 10-25 MG per tablet  Commonly known as:  VASERETIC  Take 1 tablet by mouth daily.     enoxaparin 40 MG/0.4ML injection  Commonly known as:  LOVENOX  Inject 0.8 mLs (80 mg total) into the skin every 12 (twelve) hours.     esomeprazole 40 MG capsule  Commonly known as:  NEXIUM  Take 20 mg by mouth daily.     feeding supplement (ENSURE COMPLETE) Liqd  Take 237 mLs by mouth 2 (two) times daily between meals.     levofloxacin 500 MG tablet  Commonly known as:  LEVAQUIN  Take 1 tablet (500 mg total) by mouth daily.     LOVAZA 1 G capsule  Generic drug:  omega-3 acid ethyl esters  Take 2 capsules by mouth daily.     oxyCODONE-acetaminophen 5-325 MG per tablet  Commonly known as:  ROXICET  Take 1-2 tablets by mouth every 4 (four) hours as needed.     PREZCOBIX 800-150 MG per tablet  Generic drug:  darunavir-cobicistat  Take 1 tablet by mouth daily. Swallow whole. Do NOT crush, break or chew tablets. Take with food.     TIVICAY 50 MG tablet  Generic drug:  dolutegravir  Take 50 mg by mouth daily.     valACYclovir 500 MG tablet  Commonly known as:  VALTREX  Take 500 mg by mouth daily.        VVS, Skin clean, dry and intact without evidence of skin break down, no  evidence of skin tears noted. IV catheter discontinued intact. Site without signs and symptoms of complications. Dressing and pressure applied.  An After Visit Summary was printed and given to the patient.  D/c education completed with patient/family including follow up instructions, medication list, d/c activities limitations if indicated, with other d/c instructions as indicated by MD - patient able to verbalize understanding, all questions fully answered.   Patient instructed to return to ED, call 911, or call MD for any changes in condition.   Patient escorted via [place], and D/C home via private auto.  Luberta Mutter [place]perance 02/02/2014 3:09 PM

## 2014-02-02 NOTE — Discharge Instructions (Signed)
Information on my medicine - XARELTO® (rivaroxaban) ° °This medication education was reviewed with me or my healthcare representative as part of my discharge preparation.  The pharmacist that spoke with me during my hospital stay was:  Ketara Cavness Kay Linkin Vizzini, RPH ° °WHY WAS XARELTO® PRESCRIBED FOR YOU? °Xarelto® was prescribed to treat blood clots that may have been found in the veins of your legs (deep vein thrombosis) or in your lungs (pulmonary embolism) and to reduce the risk of them occurring again. ° °What do you need to know about Xarelto®? °The starting dose is one 15 mg tablet taken TWICE daily with food for the FIRST 21 DAYS then  the dose is changed to one 20 mg tablet taken ONCE A DAY with your evening meal. ° °DO NOT stop taking Xarelto® without talking to the health care provider who prescribed the medication.  Refill your prescription for 20 mg tablets before you run out. ° °After discharge, you should have regular check-up appointments with your healthcare provider that is prescribing your Xarelto®.  In the future your dose may need to be changed if your kidney function changes by a significant amount. ° °What do you do if you miss a dose? °If you are taking Xarelto® TWICE DAILY and you miss a dose, take it as soon as you remember. You may take two 15 mg tablets (total 30 mg) at the same time then resume your regularly scheduled 15 mg twice daily the next day. ° °If you are taking Xarelto® ONCE DAILY and you miss a dose, take it as soon as you remember on the same day then continue your regularly scheduled once daily regimen the next day. Do not take two doses of Xarelto® at the same time.  ° °Important Safety Information °Xarelto® is a blood thinner medicine that can cause bleeding. You should call your healthcare provider right away if you experience any of the following: °  Bleeding from an injury or your nose that does not stop. °  Unusual colored urine (red or dark brown) or unusual colored stools (red  or black). °  Unusual bruising for unknown reasons. °  A serious fall or if you hit your head (even if there is no bleeding). ° °Some medicines may interact with Xarelto® and might increase your risk of bleeding while on Xarelto®. To help avoid this, consult your healthcare provider or pharmacist prior to using any new prescription or non-prescription medications, including herbals, vitamins, non-steroidal anti-inflammatory drugs (NSAIDs) and supplements. ° °This website has more information on Xarelto®: www.xarelto.com. °

## 2014-02-03 LAB — PROTEIN C, TOTAL: Protein C, Total: 98 % (ref 72–160)

## 2014-02-03 LAB — PROTEIN S, TOTAL: Protein S Ag, Total: 181 % — ABNORMAL HIGH (ref 60–150)

## 2014-02-06 LAB — PROTHROMBIN GENE MUTATION

## 2014-02-06 LAB — FACTOR 5 LEIDEN

## 2014-02-16 ENCOUNTER — Ambulatory Visit (INDEPENDENT_AMBULATORY_CARE_PROVIDER_SITE_OTHER)
Admission: RE | Admit: 2014-02-16 | Discharge: 2014-02-16 | Disposition: A | Discharge status: Home / Self Care | Payer: Managed Care, Other (non HMO) | Source: Ambulatory Visit | Attending: Pulmonary Disease | Admitting: Pulmonary Disease

## 2014-02-16 ENCOUNTER — Ambulatory Visit (INDEPENDENT_AMBULATORY_CARE_PROVIDER_SITE_OTHER): Payer: Managed Care, Other (non HMO) | Admitting: Pulmonary Disease

## 2014-02-16 ENCOUNTER — Encounter (INDEPENDENT_AMBULATORY_CARE_PROVIDER_SITE_OTHER): Payer: Self-pay

## 2014-02-16 ENCOUNTER — Encounter: Payer: Self-pay | Admitting: Pulmonary Disease

## 2014-02-16 VITALS — BP 130/82 | HR 97 | Temp 97.8°F | Ht 65.0 in | Wt 167.0 lb

## 2014-02-16 DIAGNOSIS — I2699 Other pulmonary embolism without acute cor pulmonale: Secondary | ICD-10-CM

## 2014-02-16 MED ORDER — WARFARIN SODIUM 5 MG PO TABS: 5.0000 mg | ORAL_TABLET | Freq: Every day | ORAL | Status: DC

## 2014-02-16 NOTE — Assessment & Plan Note (Signed)
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 He has been on Lovenox since that time, but I think he would be best served by changing over to oral anticoagulation for at least 3 months. We can then do a ventilation perfusion scan to evaluate for resolution given his extensive clot burden, and if clear, could then consider discontinuing Coumadin. It should be noted the patient's lower extremity Doppler exam was unremarkable, but he has not had an episode to look at his right ventricle or for the presence of pulmonary hypertension. This will need to be done. We'll also check a chest x-ray given the possibility of pulmonary infarction and pleural effusion on the right. He does have decreased breath sounds in the right base today on exam.

## 2014-02-16 NOTE — Addendum Note (Signed)
Addended by: Raymondo Band D on: 02/16/2014 05:12 PM   Modules accepted: Orders

## 2014-02-16 NOTE — Progress Notes (Signed)
   Subjective:    Patient ID: Michael [name], male    DOB: 01/19/1956, 58 y.o.   MRN: [id number]  HPI The patient is a 58 year old male who I've been asked to see for management of thromboembolic disease. He has had recent shoulder surgery, and 2-1/2 weeks later developed the sudden onset of pleuritic chest pain and shortness of breath. He was ultimately found to have bilateral pulmonary emboli, with at least moderate clot burden. He had lower extremity Dopplers done which were unremarkable, but did not have an echocardiogram. He had a chest x-ray that showed an infiltrative process in the right base with possible fusion, unclear whether this was a pneumonic process or pulmonary infarct. The patient was discharged on Lovenox injections, and not started on oral anticoagulation because of possible interaction with his HIV medications. He has had an anti-coagulation panel that is really unremarkable. The patient feels that his breathing is much improved, but has not returned back to baseline.   Review of Systems  Constitutional: Negative for fever and unexpected weight change.  HENT: Positive for congestion and rhinorrhea. Negative for dental problem, ear pain, nosebleeds, postnasal drip, sinus pressure, sneezing, sore throat and trouble swallowing.   Eyes: Negative for redness and itching.  Respiratory: Positive for shortness of breath. Negative for cough, chest tightness and wheezing.   Cardiovascular: Negative for palpitations and leg swelling.  Gastrointestinal: Negative for nausea and vomiting.  Genitourinary: Negative for dysuria.  Musculoskeletal: Negative for joint swelling.  Skin: Negative for rash.  Neurological: Negative for headaches.  Hematological: Does not bruise/bleed easily.  Psychiatric/Behavioral: Negative for dysphoric mood. The patient is nervous/anxious.        Objective:   Physical Exam Constitutional:  Well developed, no acute distress  HENT:  Nares patent without  discharge  Oropharynx without exudate, palate and uvula are normal  Eyes:  Perrla, eomi, no scleral icterus  Neck:  No JVD, no TMG  Cardiovascular:  Normal rate, regular rhythm, no rubs or gallops.  No murmurs        Intact distal pulses  Pulmonary :  Decreased bs right base, no stridor or respiratory distress   No rales, rhonchi, or wheezing  Abdominal:  Soft, nondistended, bowel sounds present.  No tenderness noted.   Musculoskeletal:  No lower extremity edema noted.  Lymph Nodes:  No cervical lymphadenopathy noted  Skin:  No cyanosis noted  Neurologic:  Alert, appropriate, moves all 4 extremities without obvious deficit.         Assessment & Plan:

## 2014-02-16 NOTE — Patient Instructions (Addendum)
Will refer you to the anticoagulation clinic to start on coumadin.  You will see them on May 19.  In the meantime, start coumadin 5 mg once daily.  Take first dose tonight.  You will stay on Lovenox as directed as well until you are seen by the Coumadin Clinic and further instructions are given. Will schedule for echo of your heart to evaluate for pulmonary hypertension. Check chest xray today to followup abnormalities noted during hospital.  followup with me again in 8weeks, but call if having breathing issues.

## 2014-02-16 NOTE — Addendum Note (Signed)
Addended by: Lilli Few on: 02/16/2014 04:46 PM   Modules accepted: Orders

## 2014-02-17 ENCOUNTER — Institutional Professional Consult (permissible substitution): Payer: Managed Care, Other (non HMO) | Admitting: Pulmonary Disease

## 2014-02-17 ENCOUNTER — Telehealth: Payer: Self-pay | Admitting: Pulmonary Disease

## 2014-02-17 NOTE — Telephone Encounter (Signed)
Notes Recorded by Kathee Delton, MD on 02/16/2014 at 5:45 PM Please let pt know that his chest xray shows there is still abnormality in the bottom of his right lung, along with a bit of fluid around the lung. 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 This will need to be absorbed by the body over time. ---  I spoke with patient about results and he verbalized understanding and had no questions.

## 2014-02-21 ENCOUNTER — Ambulatory Visit (INDEPENDENT_AMBULATORY_CARE_PROVIDER_SITE_OTHER): Payer: Managed Care, Other (non HMO) | Admitting: Pharmacist

## 2014-02-21 DIAGNOSIS — I2699 Other pulmonary embolism without acute cor pulmonale: Secondary | ICD-10-CM

## 2014-02-21 LAB — POCT INR: INR: 1.2

## 2014-02-23 ENCOUNTER — Ambulatory Visit (INDEPENDENT_AMBULATORY_CARE_PROVIDER_SITE_OTHER): Payer: Managed Care, Other (non HMO)

## 2014-02-23 ENCOUNTER — Telehealth: Payer: Self-pay | Admitting: *Deleted

## 2014-02-23 DIAGNOSIS — I2699 Other pulmonary embolism without acute cor pulmonale: Secondary | ICD-10-CM

## 2014-02-23 LAB — POCT INR: INR: 1.4

## 2014-02-23 MED ORDER — ENOXAPARIN SODIUM 80 MG/0.8ML ~~LOC~~ SOLN: 80.0000 mg | Freq: Two times a day (BID) | SUBCUTANEOUS | Status: DC

## 2014-02-28 ENCOUNTER — Ambulatory Visit (INDEPENDENT_AMBULATORY_CARE_PROVIDER_SITE_OTHER): Payer: Managed Care, Other (non HMO) | Admitting: *Deleted

## 2014-02-28 ENCOUNTER — Other Ambulatory Visit: Payer: Self-pay | Admitting: *Deleted

## 2014-02-28 DIAGNOSIS — I2699 Other pulmonary embolism without acute cor pulmonale: Secondary | ICD-10-CM

## 2014-02-28 LAB — POCT INR: INR: 2.2

## 2014-02-28 MED ORDER — WARFARIN SODIUM 5 MG PO TABS: 5.0000 mg | ORAL_TABLET | ORAL | Status: DC

## 2014-03-06 ENCOUNTER — Other Ambulatory Visit: Payer: Self-pay

## 2014-03-06 ENCOUNTER — Ambulatory Visit (HOSPITAL_COMMUNITY): Payer: Managed Care, Other (non HMO) | Attending: Cardiovascular Disease | Admitting: Radiology

## 2014-03-06 ENCOUNTER — Ambulatory Visit (INDEPENDENT_AMBULATORY_CARE_PROVIDER_SITE_OTHER): Payer: Managed Care, Other (non HMO) | Admitting: Pharmacist

## 2014-03-06 DIAGNOSIS — R072 Precordial pain: Secondary | ICD-10-CM

## 2014-03-06 DIAGNOSIS — I2699 Other pulmonary embolism without acute cor pulmonale: Secondary | ICD-10-CM

## 2014-03-06 DIAGNOSIS — R0602 Shortness of breath: Secondary | ICD-10-CM

## 2014-03-06 DIAGNOSIS — R079 Chest pain, unspecified: Secondary | ICD-10-CM | POA: Insufficient documentation

## 2014-03-06 LAB — POCT INR: INR: 4.7

## 2014-03-06 NOTE — Progress Notes (Signed)
Echocardiogram performed.  

## 2014-03-07 ENCOUNTER — Telehealth: Payer: Self-pay | Admitting: Pulmonary Disease

## 2014-03-07 NOTE — Telephone Encounter (Signed)
Spoke with the pt and advised that we will forward msg to [facility] to advise on ECHO results  Pt verbalized understanding  Please advise, thanks!

## 2014-03-07 NOTE — Telephone Encounter (Signed)
Please let pt know that his echo is totally normal.  No issues with right side of heart.

## 2014-03-08 NOTE — Telephone Encounter (Signed)
Pt advised of echo results. Pt states that he is supposed to start rehab soon for a shoulder injury and he wants to know are there any restrictions for his exercise due to the blood clots? Fairwood Bing, CMA

## 2014-03-08 NOTE — Telephone Encounter (Signed)
None at this point

## 2014-03-09 NOTE — Telephone Encounter (Signed)
Pt aware. Nothing further needed 

## 2014-03-10 ENCOUNTER — Ambulatory Visit (INDEPENDENT_AMBULATORY_CARE_PROVIDER_SITE_OTHER): Payer: Managed Care, Other (non HMO) | Admitting: *Deleted

## 2014-03-10 DIAGNOSIS — I2699 Other pulmonary embolism without acute cor pulmonale: Secondary | ICD-10-CM

## 2014-03-10 LAB — POCT INR: INR: 2.5

## 2014-03-17 ENCOUNTER — Ambulatory Visit (INDEPENDENT_AMBULATORY_CARE_PROVIDER_SITE_OTHER): Payer: Managed Care, Other (non HMO)

## 2014-03-17 DIAGNOSIS — I2699 Other pulmonary embolism without acute cor pulmonale: Secondary | ICD-10-CM

## 2014-03-17 LAB — POCT INR: INR: 3.9

## 2014-03-27 ENCOUNTER — Ambulatory Visit (INDEPENDENT_AMBULATORY_CARE_PROVIDER_SITE_OTHER): Payer: Managed Care, Other (non HMO) | Admitting: Pharmacist

## 2014-03-27 DIAGNOSIS — I2699 Other pulmonary embolism without acute cor pulmonale: Secondary | ICD-10-CM

## 2014-03-27 LAB — POCT INR: INR: 2.4

## 2014-04-11 ENCOUNTER — Ambulatory Visit (INDEPENDENT_AMBULATORY_CARE_PROVIDER_SITE_OTHER): Payer: Managed Care, Other (non HMO) | Admitting: *Deleted

## 2014-04-11 DIAGNOSIS — I2699 Other pulmonary embolism without acute cor pulmonale: Secondary | ICD-10-CM

## 2014-04-11 LAB — POCT INR: INR: 2.2

## 2014-04-12 ENCOUNTER — Ambulatory Visit (INDEPENDENT_AMBULATORY_CARE_PROVIDER_SITE_OTHER): Payer: Managed Care, Other (non HMO) | Admitting: Pulmonary Disease

## 2014-04-12 ENCOUNTER — Encounter: Payer: Self-pay | Admitting: Pulmonary Disease

## 2014-04-12 VITALS — BP 110/60 | HR 60 | Temp 97.7°F | Ht 65.0 in | Wt 174.2 lb

## 2014-04-12 DIAGNOSIS — I2699 Other pulmonary embolism without acute cor pulmonale: Secondary | ICD-10-CM

## 2014-04-12 NOTE — Patient Instructions (Signed)
Continue on coumadin for now, and will check lung scan end of august to re-evaluate for persistent clot burden If your lung scan looks good, will stop coumadin end of august.  Will call you with results.

## 2014-04-12 NOTE — Assessment & Plan Note (Signed)
The patient has done very well on anticoagulation, and has now started getting back to his usual exercise program. He feels that his shortness of breath has resolved, and is having no complications off his Coumadin. I would like to treat him for a total of 3 months with Coumadin, and that will take him to the end of August.  At that point, I would check a lung perfusion scan, and if normal, can discontinue his Coumadin.

## 2014-04-12 NOTE — Progress Notes (Signed)
   Subjective:    Patient ID: Michael [name], male    DOB: 05-May-1956, 58 y.o.   MRN: [id number]  HPI Patient comes in today for followup of his recent pulmonary embolus. He is been on Coumadin since the end of May, and has done very well since that time. He has had an echocardiogram which showed no RV dysfunction or pulmonary hypertension. He has now gotten back to his exercise regimen, and feels he is nearing his usual baseline. He is having no pleuritic chest pain or hemoptysis, nor has he had any swelling in his lower extremities.   Review of Systems  Constitutional: Negative for fever and unexpected weight change.  HENT: Negative for congestion, dental problem, ear pain, nosebleeds, postnasal drip, rhinorrhea, sinus pressure, sneezing, sore throat and trouble swallowing.   Eyes: Negative for redness and itching.  Respiratory: Negative for cough, chest tightness, shortness of breath and wheezing.   Cardiovascular: Negative for palpitations and leg swelling.  Gastrointestinal: Negative for nausea and vomiting.  Genitourinary: Negative for dysuria.  Musculoskeletal: Negative for joint swelling.  Skin: Negative for rash.  Neurological: Negative for headaches.  Hematological: Does not bruise/bleed easily.  Psychiatric/Behavioral: Negative for dysphoric mood. The patient is not nervous/anxious.        Objective:   Physical Exam Well-developed male in no acute distress Nose without purulence or discharge noted Neck without lymphadenopathy or thyromegaly Chest totally clear to auscultation, no wheezing Cardiac exam with regular rate and rhythm Lower extremities without edema, no cyanosis Alert and oriented, moves all 4 extremities to        Assessment & Plan:

## 2014-04-20 ENCOUNTER — Telehealth: Payer: Self-pay | Admitting: Pulmonary Disease

## 2014-04-20 NOTE — Telephone Encounter (Signed)
Pt aware of below. Nothing further needed 

## 2014-04-20 NOTE — Telephone Encounter (Signed)
He should have no issues with flying from an oxygen standpoint.  However, if it is a long flight, he should get up and stretch his legs about every 2 hrs if possible.  Should also be doing this if driving long distances.

## 2014-04-20 NOTE — Telephone Encounter (Signed)
Pt was seen 04/12/14. Called spoke with pt. He is suppose to fly to [place] before his lung scan that is scheduled for a meeting. Please advise [place] thanks

## 2014-05-02 ENCOUNTER — Ambulatory Visit (INDEPENDENT_AMBULATORY_CARE_PROVIDER_SITE_OTHER): Payer: Managed Care, Other (non HMO) | Admitting: Surgery

## 2014-05-02 DIAGNOSIS — I2699 Other pulmonary embolism without acute cor pulmonale: Secondary | ICD-10-CM

## 2014-05-02 LAB — POCT INR: INR: 2.2

## 2014-05-22 ENCOUNTER — Ambulatory Visit (HOSPITAL_COMMUNITY)
Admission: RE | Admit: 2014-05-22 | Discharge: 2014-05-22 | Disposition: A | Discharge status: Home / Self Care | Payer: Managed Care, Other (non HMO) | Source: Ambulatory Visit | Attending: Pulmonary Disease | Admitting: Pulmonary Disease

## 2014-05-22 ENCOUNTER — Telehealth: Payer: Self-pay | Admitting: Critical Care Medicine

## 2014-05-22 DIAGNOSIS — I2699 Other pulmonary embolism without acute cor pulmonale: Secondary | ICD-10-CM

## 2014-05-22 MED ORDER — TECHNETIUM TC 99M DIETHYLENETRIAME-PENTAACETIC ACID: 43.5000 | Freq: Once | INTRAVENOUS | Status: AC | PRN

## 2014-05-22 MED ORDER — TECHNETIUM TO 99M ALBUMIN AGGREGATED
5.4000 | Freq: Once | INTRAVENOUS | Status: AC | PRN
  Administered 2014-05-22: 5 via INTRAVENOUS

## 2014-05-22 NOTE — Telephone Encounter (Signed)
Called and spoke with pt and he is aware of [place] recs.   He will come by tomorrow to pick up the letter that [facility] wrote for him. Copy of letter has been placed in [facility] scan folder.   Nothing further is needed.

## 2014-05-22 NOTE — Telephone Encounter (Signed)
[place] pt. He had VQ scan done today. Pt leaves Wednesday out of state. He is suppose to fly out of state for a meeting and is not feeling up to this d/t congestion. He is wanting to know if [facility] will write a letter stating it is not in okay for pt fly at this time. Please advise thanks

## 2014-05-22 NOTE — Telephone Encounter (Signed)
Let pt know that his lung scan shows that he has not totally re-established circulation to his right lower lung.  I think he needs to be on coumadin for a total of 78mos because of this.  I would like to see him back in office in 87mos to see how things are going.  Regarding note, I cannot write a note to not fly because of congestion (peolpe fly everyday with congestion).  If he feels strongly about this, I can write a note saying he has a blood clot in his lung, but that is all.

## 2014-05-22 NOTE — Telephone Encounter (Signed)
Pt aware of results. He still wants a letter that [facility] stated it can state he has a blood clot.  Would like to pick this up tomorrow.  He wants to know many blood clots he currently has, of if any? He is concerned about developing pulm HTN in that are. What are the chances? What's his oxygen flow through the lungs? Per pt this was measured? Per pt how much circulation is in the right lower lung?   Please advise [place] thanks

## 2014-05-22 NOTE — Telephone Encounter (Signed)
Remind him that he just had an echo that showed NO pulmonary htn or complication from his PE.  We did the lung scan to see if we could stop the coumadin early.  Since it did not normalize as of yet, would keep on the coumadin on for another 3 mos.  The only defect on the whole scan was in the bottom of the right lung, where he had his pulmonary infarct.  Does not surprise me this has not cleared.  He does not have any significant clot burden.  If he does not have pulmonary htn now, unlikely he will develop.

## 2014-05-23 ENCOUNTER — Ambulatory Visit (INDEPENDENT_AMBULATORY_CARE_PROVIDER_SITE_OTHER): Payer: Managed Care, Other (non HMO) | Admitting: *Deleted

## 2014-05-23 DIAGNOSIS — I2699 Other pulmonary embolism without acute cor pulmonale: Secondary | ICD-10-CM

## 2014-05-23 LAB — POCT INR: INR: 2.5

## 2014-05-26 ENCOUNTER — Encounter: Payer: Self-pay | Admitting: Pulmonary Disease

## 2014-05-26 ENCOUNTER — Ambulatory Visit (INDEPENDENT_AMBULATORY_CARE_PROVIDER_SITE_OTHER): Payer: Managed Care, Other (non HMO) | Admitting: Pulmonary Disease

## 2014-05-26 VITALS — BP 120/70 | HR 72 | Temp 97.9°F | Ht 65.0 in | Wt 180.6 lb

## 2014-05-26 DIAGNOSIS — I2699 Other pulmonary embolism without acute cor pulmonale: Secondary | ICD-10-CM

## 2014-05-26 NOTE — Assessment & Plan Note (Signed)
The patient has a history of pulmonary embolism, and has been treated for 3 months with Coumadin. He underwent a ventilation perfusion scan to reassess prior to discontinuing the medication, and was found to have a matched defect in the right lower lobe as expected given his history of a pulmonary infarct. However, the radiologist felt that the degree of perfusion abnormality was greater then the degree of ventilation abnormality, and I think to be on the safe side will treat him with a full 6 months of therapy. We can then decide at the end of 6 months whether or not to repeat his VQ scan. The patient is totally asymptomatic at this point, and has returned to his exercise program with actual improved breathing over his level even before the embolism. He is getting ready to fly on a long distance trip to [place], and I have reviewed with him all of the precautions. Will also do a hypercoagulable workup once he is off Coumadin for a period of time.

## 2014-05-26 NOTE — Patient Instructions (Signed)
Continue on coumadin for another 57mos Wear thigh high TED hose on plane ride to [place], do ankle pumps, up every 2 hrs on plane flight. Will check hypercoagulable workup once you are off coumadin for a period of time followup with me again in 1mos, but call if questions or problems.

## 2014-05-26 NOTE — Progress Notes (Signed)
   Subjective:    Patient ID: Michael [name], male    DOB: 04-29-1956, 58 y.o.   MRN: [id number]  HPI The patient comes in today for followup after his recent ventilation perfusion scan after treatment with Coumadin for 3 months. He was found to have a match defect in the right lower lobe as expected with his pulmonary infarct, but radiology felt the degree of perfusion abnormality was greater then his ventilation defect. The patient has returned to his exercise program and is doing outstanding, and remains totally asymptomatic.   Review of Systems  Constitutional: Negative for fever and unexpected weight change.  HENT: Negative for congestion, dental problem, ear pain, nosebleeds, postnasal drip, rhinorrhea, sinus pressure, sneezing, sore throat and trouble swallowing.   Eyes: Negative for redness and itching.  Respiratory: Negative for cough, chest tightness, shortness of breath and wheezing.   Cardiovascular: Negative for palpitations and leg swelling.  Gastrointestinal: Negative for nausea and vomiting.  Genitourinary: Negative for dysuria.  Musculoskeletal: Negative for joint swelling.  Skin: Negative for rash.  Neurological: Negative for headaches.  Hematological: Does not bruise/bleed easily.  Psychiatric/Behavioral: Negative for dysphoric mood. The patient is not nervous/anxious.        Objective:   Physical Exam Overweight male in no acute distress Nose without purulence or discharge noted Neck without lymphadenopathy or thyromegaly Lower extremities without edema, no cyanosis Alert and oriented, moves all 4 extremities.       Assessment & Plan:

## 2014-05-29 ENCOUNTER — Ambulatory Visit (HOSPITAL_COMMUNITY): Payer: Managed Care, Other (non HMO)

## 2014-07-04 ENCOUNTER — Ambulatory Visit (INDEPENDENT_AMBULATORY_CARE_PROVIDER_SITE_OTHER): Payer: Managed Care, Other (non HMO) | Admitting: Pharmacist Clinician (PhC)/ Clinical Pharmacy Specialist

## 2014-07-04 DIAGNOSIS — I2699 Other pulmonary embolism without acute cor pulmonale: Secondary | ICD-10-CM

## 2014-07-04 LAB — POCT INR: INR: 2.4

## 2014-08-10 ENCOUNTER — Telehealth: Payer: Self-pay | Admitting: Pulmonary Disease

## 2014-08-10 MED ORDER — WARFARIN SODIUM 5 MG PO TABS
ORAL_TABLET | ORAL | Status: DC
Start: 2014-08-10 — End: 2015-04-04

## 2014-08-10 NOTE — Telephone Encounter (Signed)
Called pt and made aware he needs to call coumadin clinic for refill. nothing further needed

## 2014-08-10 NOTE — Telephone Encounter (Signed)
Rx refill sent to pharmacy as requested by pt.

## 2014-08-10 NOTE — Addendum Note (Signed)
Addended by: Theophilus Kinds on: 08/10/2014 03:04 PM   Modules accepted: Orders

## 2014-08-15 ENCOUNTER — Ambulatory Visit (INDEPENDENT_AMBULATORY_CARE_PROVIDER_SITE_OTHER): Payer: Managed Care, Other (non HMO) | Admitting: Surgery

## 2014-08-15 DIAGNOSIS — I2699 Other pulmonary embolism without acute cor pulmonale: Secondary | ICD-10-CM

## 2014-08-15 LAB — POCT INR: INR: 1.8

## 2014-08-28 ENCOUNTER — Encounter: Payer: Self-pay | Admitting: Pulmonary Disease

## 2014-08-28 ENCOUNTER — Ambulatory Visit: Payer: Self-pay | Admitting: Pharmacist

## 2014-08-28 ENCOUNTER — Ambulatory Visit (INDEPENDENT_AMBULATORY_CARE_PROVIDER_SITE_OTHER): Payer: Managed Care, Other (non HMO) | Admitting: Pulmonary Disease

## 2014-08-28 VITALS — BP 116/74 | HR 62 | Temp 97.0°F | Ht 65.5 in | Wt 182.4 lb

## 2014-08-28 DIAGNOSIS — I2699 Other pulmonary embolism without acute cor pulmonale: Secondary | ICD-10-CM

## 2014-08-28 NOTE — Assessment & Plan Note (Signed)
The patient is doing very well since the last visit, and has now completed 6 months of Coumadin. He is doing very well from a breathing standpoint, and is not having significant lower extremity edema or pleuritic chest pain. He did have a hypercoagulable workup drawn while in the hospital, but it is unclear if he was already on therapy when this was done. Everything was negative except for an abnormal lupus anticoagulant. I would like to discontinue his Coumadin since he has been on it for 6 months, and recheck a panel sometime in January.

## 2014-08-28 NOTE — Progress Notes (Signed)
   Subjective:    Patient ID: Michael [name], male    DOB: 27-Oct-1955, 58 y.o.   MRN: [id number]  HPI The patient comes in today for follow-up of his pulmonary embolus from earlier in the year. He has now completed 6 months of anticoagulation, and has done very well from a breathing standpoint. He has had no further lower extremity edema or pleuritic chest pain, and feels that his breathing is back to baseline. I have reviewed his labs from his initial hospitalization, and he did have a hypercoagulable panel sent. This was unremarkable except for an abnormal lupus anticoagulant, but it was unclear when his labs were drawn during his hospital course.   Review of Systems  Constitutional: Negative for fever and unexpected weight change.  HENT: Negative for congestion, dental problem, ear pain, nosebleeds, postnasal drip, rhinorrhea, sinus pressure, sneezing, sore throat and trouble swallowing.   Eyes: Negative for redness and itching.  Respiratory: Negative for cough, chest tightness, shortness of breath and wheezing.   Cardiovascular: Negative for palpitations and leg swelling.  Gastrointestinal: Negative for nausea and vomiting.  Genitourinary: Negative for dysuria.  Musculoskeletal: Negative for joint swelling.  Skin: Negative for rash.  Neurological: Negative for headaches.  Hematological: Does not bruise/bleed easily.  Psychiatric/Behavioral: Negative for dysphoric mood. The patient is not nervous/anxious.        Objective:   Physical Exam Overweight male in no acute distress Nose without purulence or discharge noted Neck without lymphadenopathy or thyromegaly Chest totally clear to auscultation, no wheezing Cardiac exam with regular rate and rhythm Lower extremities without edema, no cyanosis Alert and oriented, moves all 4 extremities.       Assessment & Plan:

## 2014-08-28 NOTE — Patient Instructions (Signed)
Ok to stop coumadin Would like to recheck some bloodwork in January to make sure you do not have a genetic predisposition to blood clots.  Please call us in Jan as a reminder.

## 2014-10-05 ENCOUNTER — Telehealth: Payer: Self-pay | Admitting: Pulmonary Disease

## 2014-10-05 DIAGNOSIS — I2699 Other pulmonary embolism without acute cor pulmonale: Secondary | ICD-10-CM

## 2014-10-05 NOTE — Telephone Encounter (Signed)
Michael [name] pt called and stated that he is to come back in [place] for labs.  Please advise which labs you would like to have the pt do.  Thanks  Allergies  Allergen Reactions  . Efavirenz Rash    Current Outpatient Prescriptions on File Prior to Visit  Medication Sig Dispense Refill  . ALPRAZolam (XANAX XR) 3 MG 24 hr tablet Take 3 mg by mouth daily.      Marland Kitchen atorvastatin (LIPITOR) 10 MG tablet Take 10 mg by mouth daily.      Marland Kitchen BECONASE AQ 42 MCG/SPRAY nasal spray Place 1 spray into the nose daily as needed for allergies.     Marland Kitchen darunavir-cobicistat (PREZCOBIX) 800-150 MG per tablet Take 1 tablet by mouth daily. Swallow whole. Do NOT crush, break or chew tablets. Take with food.    . dolutegravir (TIVICAY) 50 MG tablet Take 50 mg by mouth daily.    Marland Kitchen emtricitabine-tenofovir (TRUVADA) 200-300 MG per tablet Take 1 tablet by mouth daily.      . enalapril-hydrochlorothiazide (VASERETIC) 10-25 MG per tablet Take 1 tablet by mouth daily.    Marland Kitchen esomeprazole (NEXIUM) 40 MG capsule Take 20 mg by mouth daily.     Marland Kitchen LOVAZA 1 G capsule Take 2 capsules by mouth daily.    . montelukast (SINGULAIR) 10 MG tablet Once tablet at bedtime    . valACYclovir (VALTREX) 500 MG tablet Take 500 mg by mouth daily.     Marland Kitchen warfarin (COUMADIN) 5 MG tablet Take as directed by Coumadin clinic 50 tablet 3   No current facility-administered medications on file prior to visit.   s

## 2014-10-10 NOTE — Telephone Encounter (Signed)
Needs:  Factor 5 leiden, antithrombin III, protein c level, protein s level, anticardiolipin antibody, lupus anticoagulant.

## 2014-10-11 NOTE — Telephone Encounter (Signed)
Lab orders placed. Pt aware and nothing further needed

## 2014-10-16 ENCOUNTER — Other Ambulatory Visit: Payer: Managed Care, Other (non HMO)

## 2014-10-16 DIAGNOSIS — I2699 Other pulmonary embolism without acute cor pulmonale: Secondary | ICD-10-CM

## 2014-10-17 LAB — LUPUS ANTICOAGULANT PANEL
DRVVT: 36.6 secs (ref <42.9)
LUPUS ANTICOAGULANT: NOT DETECTED
PTT Lupus Anticoagulant: 31.8 secs (ref 28.0–43.0)

## 2014-10-17 LAB — ANTITHROMBIN III: ANTITHROMB III FUNC: 104 % (ref 76–126)

## 2014-10-17 LAB — CARDIOLIPIN ANTIBODIES, IGG, IGM, IGA
Anticardiolipin IgA: 11 APL U/mL (ref <22)
Anticardiolipin IgG: 22 GPL U/mL (ref <23)
Anticardiolipin IgM: 23 MPL U/mL — ABNORMAL HIGH (ref <11)

## 2014-10-19 LAB — PROTEIN C, TOTAL: Protein C, Total: 114 % (ref 72–160)

## 2014-10-19 LAB — PROTEIN S, TOTAL: Protein S Total: 119 % (ref 60–150)

## 2014-10-19 LAB — FACTOR 5 LEIDEN

## 2014-10-23 ENCOUNTER — Other Ambulatory Visit: Payer: Self-pay | Admitting: Pulmonary Disease

## 2014-10-23 DIAGNOSIS — D689 Coagulation defect, unspecified: Secondary | ICD-10-CM

## 2014-11-27 ENCOUNTER — Ambulatory Visit (INDEPENDENT_AMBULATORY_CARE_PROVIDER_SITE_OTHER): Payer: Managed Care, Other (non HMO) | Admitting: Oncology

## 2014-11-27 ENCOUNTER — Encounter: Payer: Self-pay | Admitting: Oncology

## 2014-11-27 VITALS — BP 141/65 | HR 60 | Temp 98.0°F | Ht 65.0 in | Wt 183.0 lb

## 2014-11-27 DIAGNOSIS — I2699 Other pulmonary embolism without acute cor pulmonale: Secondary | ICD-10-CM

## 2014-11-27 NOTE — Progress Notes (Signed)
Patient ID: Michael [name], male   DOB: 10-06-1956, 59 y.o.   MRN: [id number] New Patient Hematology   Michael [name] [id number] 20-Dec-1955 59 y.o. 11/27/2014  CC: Dr. Danton Sewer; Dr. Rachell Cipro; Dr. Orvan Seen; Dr. 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 He has had orthopedic problems with his right shoulder. He had 3 prior operations. Most recent done on 12/15/2013 included arthroscopy with extensive debridement of the rotator cuff, a subacromial decompression procedure, and debridement of the bursa. He presented on 01/31/2014 with a two-week history of intermittent sharp right chest pain, and dyspnea with minimal exertion. CT angiography of the lungs showed bilateral pulmonary emboli affecting both left and right main pulmonary arteries. There was no mention of right heart strain on the CT scan and an echocardiogram was not done. Troponins not done.  He was treated with unfractionated heparin and then transitioned to warfarin which he took for about 6 months completing a  planned course in early December. A follow-up ventilation perfusion lung scan was done on 05/22/2014 which showed mild decrease in perfusion at the right lung base consistent with chronic pulmonary embolus but no new emboli. He is back to his full activities and is exercising with a Physiological scientist. He denies any chest pain, chest pressure, palpitations, or dyspnea.  Past history most remarkable for diagnosis of HIV positivity back in 1984 with excellent control on antiretroviral drugs. He was also diagnosed with asymptomatic hepatitis B infection. He is homosexual. He has never used intravenous drugs. He has never had a blood transfusion. He has no prior history of blood clots. He has no signs or symptoms of a collagen vascular disorder. He has had a number of previous surgical procedures  without problems. Colonoscopy done at age 64 was negative and he was put on a 10 year follow-up plan. Upper endoscopy done 2 years ago in view of chronic GERD was normal. He lost some weight when he was hospitalized and recuperating from his pulmonary embolus then gained it all back. He is a never smoker.  His father had a DVT when he was about 16 years old. His mother died of complications of multiple sclerosis at age 31 with a massive stroke possibly related to experimental medications she was taking at that time. He has no siblings. No children.  Special hematology testing done at time of diagnosis of his pulmonary embolus 02/01/2014: Negative for the factor V Leiden or prothrombin gene mutations. Normal plasma homocystine. Normal protein S, protein C, and anti-thrombin activities. Negative anticardiolipin antibody screen. Negative antibodies to beta 2 glycoprotein 1 except for mild elevation against IgA 46 units normal less than 20. A lupus type anticoagulant was positive but likely spurious done while he was on heparin. Repeat value done 10/16/2014 is negative. Repeat anticardiolipin antibodies done 10/16/2014  shows moderate elevation of IgM at 23 units, normal less than 11, which, in the context of a negative lupus anticoagulant and previous negative anticardiolipin antibodies and negative beta 2 glycoprotein 1 antibodies, is not significant.   PMH: Past Medical History  Diagnosis Date  . HTN (hypertension)   . Hyperlipidemia   . HIV (human immunodeficiency virus infection)   . GERD (gastroesophageal reflux disease)    no history of diabetes, ulcers, thyroid disease, renal disease, prostate problems, inflammatory arthritis.  Past Surgical History  Procedure Laterality Date  . Rotator cuff surgery  right and left  . Laminectomy  2010    C4/C5  . Appendectomy    . Tonsillectomy    . Colonoscopy    . Shoulder arthroscopy with subacromial decompression Right 12/15/2013     Procedure: RIGHT SHOULDER ARTHROSCOPY WITH EXTENSIVE DEBRIDEMENT OF ROTATOR CUFF,SUBACROMIAL DECOMPRESSION, DEBRIDMENT OF BURSA;  Surgeon: Ninetta Lights, MD;  Location: [facility];  Service: Orthopedics;  Laterality: Right;    Allergies: Allergies  Allergen Reactions  . Efavirenz Rash    Medications:  Current outpatient prescriptions:  .  ALPRAZolam (XANAX XR) 3 MG 24 hr tablet, Take 3 mg by mouth daily.  , Disp: , Rfl:  .  atorvastatin (LIPITOR) 10 MG tablet, Take 10 mg by mouth daily.  , Disp: , Rfl:  .  BECONASE AQ 42 MCG/SPRAY nasal spray, Place 1 spray into the nose daily as needed for allergies. , Disp: , Rfl:  .  darunavir-cobicistat (PREZCOBIX) 800-150 MG per tablet, Take 1 tablet by mouth daily. Swallow whole. Do NOT crush, break or chew tablets. Take with food., Disp: , Rfl:  .  dolutegravir (TIVICAY) 50 MG tablet, Take 50 mg by mouth daily., Disp: , Rfl:  .  emtricitabine-tenofovir (TRUVADA) 200-300 MG per tablet, Take 1 tablet by mouth daily.  , Disp: , Rfl:  .  enalapril-hydrochlorothiazide (VASERETIC) 10-25 MG per tablet, Take 1 tablet by mouth daily., Disp: , Rfl:  .  esomeprazole (NEXIUM) 40 MG capsule, Take 20 mg by mouth daily. , Disp: , Rfl:  .  LOVAZA 1 G capsule, Take 2 capsules by mouth daily., Disp: , Rfl:  .  montelukast (SINGULAIR) 10 MG tablet, Once tablet at bedtime, Disp: , Rfl:  .  valACYclovir (VALTREX) 500 MG tablet, Take 500 mg by mouth daily. , Disp: , Rfl:    Social History: Homosexual with a steady partner for 20 years whom he recently married.  never smoker.  He reports that he drinks alcohol rarely. never used illicit drugs. No prior blood transfusions.    Family History: Family History  Problem Relation Age of Onset  . Diabetes Mother   . Heart attack Paternal Grandfather   . Deep vein thrombosis Father 37  Mother died of Occasions of multiple sclerosis at age 58. He is an only child.  Review of Systems: See HPI Remaining  ROS negative.  Physical Exam: Blood pressure 141/65, pulse 60, temperature 98 F (36.7 C), temperature source Oral, height 5\' 5"  (1.651 m), weight 183 lb (83.008 kg), SpO2 97 %. Wt Readings from Last 3 Encounters:  11/27/14 183 lb (83.008 kg)  08/28/14 182 lb 6.4 oz (82.736 kg)  05/26/14 180 lb 9.6 oz (81.92 kg)     General appearance: well nourished caucasian male HENNT: Pharynx no erythema, exudate, mass, or ulcer. No thyromegaly or thyroid nodules Lymph nodes: No cervical, supraclavicular, or axillary lymphadenopathy Breasts:  Lungs: Clear to auscultation, resonant to percussion throughout Heart: Regular rhythm, no murmur, no gallop, no rub, no click, no edema Abdomen: Soft, nontender, normal bowel sounds, no mass, no organomegaly Extremities: No edema, no calf tenderness Musculoskeletal: no joint deformities GU:  Vascular: Carotid pulses 2+, no bruits,  Neurologic: Alert, oriented, PERRLA, optic discs sharp and vessels normal, no hemorrhage or exudate, cranial nerves grossly normal, motor strength 5 over 5, reflexes 2+ symmetric, upper body coordination normal, gait normal, Skin: No rash or ecchymosis    Lab Results: Lab Results  Component Value Date   WBC 7.8 02/02/2014   HGB 12.8*  02/02/2014   HCT 36.9* 02/02/2014   MCV 93.9 02/02/2014   PLT 338 02/02/2014     Chemistry      Component Value Date/Time   NA 136* 02/02/2014 0656   K 3.6* 02/02/2014 0656   CL 94* 02/02/2014 0656   CO2 28 02/02/2014 0656   BUN 11 02/02/2014 0656   CREATININE 0.96 02/02/2014 0656      Component Value Date/Time   CALCIUM 9.0 02/02/2014 0656   ALKPHOS 62 02/02/2014 0656   AST 20 02/02/2014 0656   ALT 48 02/02/2014 0656   BILITOT 0.4 02/02/2014 0656      Radiological Studies: See discussion above     Impression: Bilateral pulmonary emboli In my opinion,  this was clearly a provoked event related to his orthopedic surgery. He has no obvious inherited or acquired risk factors  for clotting.  At present, isolated elevation of IgA against beta-2 glycoprotein 1 is not felt to be a risk factor for thrombosis in the majority of people in whom it occurs. Lupus anticoagulant repeat testing negative as anticipated and he also tested negative for IgG and IgM against beta-2 glycoprotein 1, the more specific phospholipoprotein  against which antiphospholipid antibodies are felt to be directed. He has no clinical signs or symptoms to suggest that he has antiphospholipid antibody syndrome as etiology of his clot.  Recommendation: I agree with limited anticoagulation which was stopped at 6 months. He asked about whether or not he should take aspirin. We now have 2 prospective randomized studies which support use of low-dose aspirin with a moderate 30% reduction in recurrence in patients with previous venous thromboemboli. I think it is reasonable for him to take 81 mg of aspirin prophylactically although it is certainly not obligatory.     Annia Belt, MD 11/27/2014, 9:18 AM

## 2014-11-27 NOTE — Patient Instructions (Signed)
Return visit only as needed 

## 2015-01-01 ENCOUNTER — Ambulatory Visit (INDEPENDENT_AMBULATORY_CARE_PROVIDER_SITE_OTHER): Payer: Managed Care, Other (non HMO) | Admitting: Family Medicine

## 2015-01-01 ENCOUNTER — Encounter: Payer: Self-pay | Admitting: Family Medicine

## 2015-01-01 VITALS — BP 125/48 | Ht 65.0 in | Wt 170.0 lb

## 2015-01-01 DIAGNOSIS — M722 Plantar fascial fibromatosis: Secondary | ICD-10-CM | POA: Diagnosis not present

## 2015-01-01 MED ORDER — NITROGLYCERIN 0.2 MG/HR TD PT24: MEDICATED_PATCH | TRANSDERMAL | Status: DC

## 2015-01-01 NOTE — Progress Notes (Signed)
Patient ID: Michael [name], male   DOB: 1956/01/27, 59 y.o.   MRN: [id number]  [name] - 59 y.o. male MRN [id number]  Date of birth: 08/12/1956    SUBJECTIVE:     Right foot pain. Very similar in nature to-of pain he had previously with plantar fasciitis. 2 months ago he started working with a Physiological scientist. 34 weeks ago he had the onset of right foot pain, sharp in nature, very similar to previous problems. Has continued to get worse. Area painful first thing in the morning. Over the last couple days it is intensified. He has continued to work out 3 times a week, lifting weights, running laps.   ROS:     He's had no unusual weight change, fever, sweats, chills. He's noted no rash or erythema or other lesions of the foot. He's had no foot swelling. No ankle swelling. No unusual arthralgias.  PERTINENT  PMH / PSH FH / / SH:  Past Medical, Surgical, Social, and Family History Reviewed & Updated in the EMR.  Pertinent findings include:  No personal history diabetes mellitus. HIV disease. Previous history of plantar fasciitis on the right foot. History of pulmonary embolus currently on warfarin  OBJECTIVE: BP 125/48 mmHg  Ht 5\' 5"  (1.651 m)  Wt 170 lb (77.111 kg)  BMI 28.29 kg/m2  Physical Exam:  Vital signs are reviewed. Well-developed male no acute distress FEET: Cavus foot bilaterally. The right foot is tender to palpation at the origin the plantar fascia. Is also tender over the neurovascular bundle between the FHL and FDL. There is no tenderness to palpation along the metatarsal heads. ANKLE: Intact range of motion and strength and plantarflexion dorsiflexion, eversion and inversion bilaterally symmetrical. Vascular: Dorsalis pedis and posterior tibialis pulses 2+ bilaterally equal ULTRASOUND: Right foot plantar fascia appears to have longitudinal tear with some fluid collection adjacent to the calcaneus. There is not a lot of increased Doppler activity along this area in the  tissue looks somewhat distorted so I think there is a fair amount of scar tissue here. The plantar fascia on the left foot also has some distortion consistent with prior injury but is otherwise intact.  ASSESSMENT & PLAN:  See problem based charting & AVS for pt instructions.

## 2015-01-01 NOTE — Assessment & Plan Note (Addendum)
RIGHT  History of prior injury/tear of plantar fashion by his report. Certainly the ultrasound shows a lot of chronic change and some apparent scar tissue. The plantar fascial does not look healthy and there is no increased Doppler flow which makes me wonder if he has torn through scar tissue--if so healing may be difficult. That would explain lack of increased vascular activity. We discussed the  options and decided to go with the fracture walker boot immobilization. He brought his boot with him. We'll also try the nitroglycerin patch. He'll follow up with one of [place] in the next 2 weeks. Discussed activity modification.

## 2015-01-01 NOTE — Patient Instructions (Signed)
You have a strain / small tear of the plantar fascia. I would recommend the fracture walker boot you have, ICE twice daily, modify activities as we discussed.  Try the nitriglycerine patch as below and return to clinic in about 2 weeks.  Cut patch into one - fourth pieces Place a one fourth piece of patch on  skin over affected area, changing to a new piece every 24 hours.   You may experience a headache during the first 1-2 days and maybe up to 2 weeks of using the patch; these should improve and go away. If you experience headaches after beginning nitroglycerin patch treatment, you may take your preferred over the counter pain medicine such as tylenol or advil as directed on the box. Another side effect of the nitroglycerin patch can be skin irritation  or rash related to the patch adhesive.   Please notify our office if you develop  severe headaches or rash, and stop the patch.  Please call our office with any questions or problems. Tendon healing with nitroglycerin patch may require 12 to 24 weeks depending on the extent of injury. Men should not use if taking Viagra, Cialis, or Levitra as it may cause an unsafe lowering of the blood pressure..  Use with caution if you have migraines or rosacea.  Very nice to see you---please call us with any questions or concerns.

## 2015-01-10 ENCOUNTER — Ambulatory Visit (INDEPENDENT_AMBULATORY_CARE_PROVIDER_SITE_OTHER): Payer: Managed Care, Other (non HMO) | Admitting: Sports Medicine

## 2015-01-10 ENCOUNTER — Encounter: Payer: Self-pay | Admitting: Sports Medicine

## 2015-01-10 VITALS — BP 113/70 | HR 70 | Ht 65.0 in | Wt 170.0 lb

## 2015-01-10 DIAGNOSIS — M722 Plantar fascial fibromatosis: Secondary | ICD-10-CM | POA: Diagnosis not present

## 2015-01-11 NOTE — Progress Notes (Signed)
   Subjective:    Patient ID: Michael [name], male    DOB: June 23, 1956, 59 y.o.   MRN: [id number]  HPI  Patient comes in today for follow-up on right foot plantar fasciitis. He saw Dr. Nori Riis on March 28. She placed him into a Cam Walker but he did not tolerate this. He has had plantar fasciitis before. He localizes his pain to the right heel and the medial aspect of his right calcaneus. Worse with activity particularly with first step. Ultrasound done at his last visit showed evidence of chronic plantar fasciitis with some probable scar tissue. The patient finds well cushioned shoes to be comfortable.   Review of Systems     Objective:   Physical Exam Well-developed, well-nourished. No acute distress. Awake alert and oriented 3. Vital signs are reviewed.  Right foot: Tenderness to palpation at the origin of the plantar fascia. Negative calcaneal squeeze. No soft tissue swelling. Cavus foot. Neurovascularly intact distally. Walking with a slight limp.       Assessment & Plan:  Right foot plantar fasciitis  Arch strap, plantar fascial stretches, and daily icing. Patient has custom orthotics but they are quite old. He will return to the office in 4 weeks for reevaluation and construction of new orthotics. Of note, he never found his old orthotics to be comfortable. They tend to push his heel out of his shoe. This is likely due to a narrow hindfoot so we may need to make some special adjustments to his new orthotics to accommodate this.

## 2015-01-15 ENCOUNTER — Ambulatory Visit: Payer: Managed Care, Other (non HMO) | Admitting: Sports Medicine

## 2015-02-05 ENCOUNTER — Ambulatory Visit (INDEPENDENT_AMBULATORY_CARE_PROVIDER_SITE_OTHER): Payer: Managed Care, Other (non HMO) | Admitting: Sports Medicine

## 2015-02-05 ENCOUNTER — Encounter: Payer: Self-pay | Admitting: Sports Medicine

## 2015-02-05 VITALS — BP 131/67 | Ht 65.0 in | Wt 170.0 lb

## 2015-02-05 DIAGNOSIS — M722 Plantar fascial fibromatosis: Secondary | ICD-10-CM | POA: Diagnosis not present

## 2015-02-05 NOTE — Progress Notes (Signed)
   Subjective:    Patient ID: Michael [name], male    DOB: [birth year], 59 y.o.   MRN: [id number]  HPI Patient returns today for follow-up of plantar fascitis. He was seen by Dr Micheline Chapman 01/10/15 and advised on stretching and icing. He states his discomfort is much improved. He has a small amount of pain in the morning and if he has been sitting for a long period of time during the day. Notes it takes much less time for the pain to dissipate once he is on his feet. He has been stretching. He was icing, though has decreased the amount of this as his discomfort has improved. States is not running at this time. Would like orthotics today. Patient is wondering if birkenstocks would be an ok shoe to wear.    Review of Systems     Objective:   Physical Exam  Constitutional: He appears well-developed and well-nourished. No distress.  HENT:  Head: Normocephalic and atraumatic.  Right foot: no tenderness to palpation at the origin of the plantar fascia. No tenderness over the arch of right foot. Negative calcaneal squeeze. No swelling noted. Flexible cavus foot. Slaying between 3rd and 4th toes c/w transverse arch collapse. Left foot: no tenderness of plantar fascia. Negative calcaneal squeeze. No swelling. Flexible cavus foot. Transverse arch collapse noted.      Assessment & Plan:  Right foot plantar fascitis. Improved with stretching and icing.   Patient was fitted for new custom orthotics today. He had neutral gait with orthotics in place and felt comfortable with gait. He will continue icing and stretching. Advised on gradual return to running between sets at the gym. Discussed that birkenstocks would probably not be the best option given the rigid base that takes so long to break in. Discussed that custom orthotics would be of greater benefit. Follow-up as needed if develops worsening symptoms.   Patient was fitted for a : standard, cushioned, semi-rigid orthotic. The orthotic was heated and  afterward the patient stood on the orthotic blank positioned on the orthotic stand. The patient was positioned in subtalar neutral position and 10 degrees of ankle dorsiflexion in a weight bearing stance. After completion of molding, a stable base was applied to the orthotic blank. The blank was ground to a stable position for weight bearing. Size: 10 Base:Blue EVA Posting: none  Patient was seen and evaluated with the above resident. I agree with the above plan of care. Total time spent with the patient was 30 minutes with greater than 50% of the time spent in face-to-face consultation discussing orthotic construction, instruction, and fitting. Follow-up when necessary. Additional orthotic padding:none

## 2015-02-05 NOTE — Patient Instructions (Signed)
Nice to see you. Please continue to ice your heal and do the stretches. You can gradually increase to running between sets at the gym. Start with walking a lap and then work your way back.

## 2015-04-04 ENCOUNTER — Ambulatory Visit (INDEPENDENT_AMBULATORY_CARE_PROVIDER_SITE_OTHER): Payer: Managed Care, Other (non HMO) | Admitting: Sports Medicine

## 2015-04-04 ENCOUNTER — Ambulatory Visit
Admission: RE | Admit: 2015-04-04 | Discharge: 2015-04-04 | Disposition: A | Discharge status: Home / Self Care | Payer: Managed Care, Other (non HMO) | Source: Ambulatory Visit | Attending: Sports Medicine | Admitting: Sports Medicine

## 2015-04-04 ENCOUNTER — Encounter: Payer: Self-pay | Admitting: Sports Medicine

## 2015-04-04 VITALS — BP 103/53 | HR 65 | Ht 65.0 in | Wt 170.0 lb

## 2015-04-04 DIAGNOSIS — M79671 Pain in right foot: Secondary | ICD-10-CM | POA: Diagnosis not present

## 2015-04-04 DIAGNOSIS — M722 Plantar fascial fibromatosis: Secondary | ICD-10-CM | POA: Diagnosis not present

## 2015-04-05 NOTE — Progress Notes (Signed)
   Subjective:    Patient ID: Michael [name], male    DOB: 1956/01/01, 59 y.o.   MRN: [id number]  HPI   Patient comes in today with persistent right heel pain. He has been dealing with plantar fasciitis for the past several months. Symptoms were tolerable up until this morning when upon awakening he discovered an intense increase in his pain. Localizes his pain to the plantar aspect of the right heel. We made him some custom orthotics back in May and he has found these to be comfortable. He does admit that he has not been icing or stretching like he should. He has also gotten away from using his arch strap.    Review of Systems     Objective:   Physical Exam Well-developed, well-nourished. No acute distress  Right foot: There is tenderness to palpation at the calcaneal insertion of the plantar fascia. No soft tissue swelling. Negative calcaneal squeeze. Negative Tinel's over the tarsal tunnel. Neurovascularly intact distally. Walking with a slight limp.       Assessment & Plan:  Returning right heel pain secondary to plantar fasciitis  Patient's symptoms have been present now for about 8 months. I've reassured him that this should eventually resolve on its own. I would like for him to resume using his arch strap and resume his plantar fascial stretches and daily icing. I will also check an AP and lateral calcaneus x-ray to rule out stress fracture. He may continue with activity as tolerated and will follow-up with me again in 3-4 weeks.  Addendum: X-rays reviewed. No evidence of calcaneal stress fracture.

## 2015-04-24 ENCOUNTER — Encounter: Payer: Self-pay | Admitting: Internal Medicine

## 2015-05-08 ENCOUNTER — Ambulatory Visit (INDEPENDENT_AMBULATORY_CARE_PROVIDER_SITE_OTHER): Payer: Managed Care, Other (non HMO) | Admitting: Sports Medicine

## 2015-05-08 ENCOUNTER — Encounter: Payer: Self-pay | Admitting: Sports Medicine

## 2015-05-08 VITALS — BP 118/78 | HR 62 | Ht 65.0 in | Wt 170.0 lb

## 2015-05-08 DIAGNOSIS — M722 Plantar fascial fibromatosis: Secondary | ICD-10-CM | POA: Diagnosis not present

## 2015-05-08 NOTE — Progress Notes (Signed)
   Subjective:    Patient ID: Michael [name], male    DOB: [birth year], 59 y.o.   MRN: [id number]  HPI   Patient comes in today for follow-up on right foot plantar fasciitis. Unfortunately he continues to struggle with pain. His pain is interfering with his quality of life. Pain continues to be centered primarily at the heel but will radiate into the arch of his foot and into the first metatarsal head. He does admit that his orthotics are comfortable but he is only able to put them in tennis shoes. He brought a pair of dress shoes today for [place] to try to fit with orthotics. He has also begun to notice a slight overlap of his first and second toe on the right foot. His symptoms have now been present for many months.    Review of Systems     Objective:   Physical Exam Well-developed, well-nourished. No acute distress  Right foot: Patient continues to have tenderness to palpation at the calcaneal insertion of the plantar fascia. No soft tissue swelling. Negative calcaneal squeeze. He does have complete collapse of the transverse arch upon standing with separation of the third and fourth toes and a slight crossing of the first and second toes.  X-rays done a few weeks ago of the right calcaneus show no obvious stress fracture. There is a small plantar calcaneal spur.       Assessment & Plan:  Chronic right heel pain secondary to plantar fasciitis Transverse arch collapse  Patient's symptoms are beginning to affect his quality of life. We attempted to make him some orthotics for his dress shoes but unfortunately we were unsuccessful in making him a comfortable pair. We tried both the [employer] as well as the more traditional red blank. To his existing orthotics we added a metatarsal pad on the right for his transverse arch collapse. Patient is asking me about the possibility of dry needling with one of the local chiropractors. I believe that it may be worth trying. I've also provided  him with the name of Dr. Wylene Simmer. Although plantar fasciitis does tend to resolve on its own his level of disability coupled with his failure of all conservative measures to date may make him a surgical candidate. I've encouraged him to see Dr. Doran Durand but in the meantime continue with his existing orthotics in his tennis shoes.

## 2015-05-08 NOTE — Patient Instructions (Signed)
Dr Wylene Simmer at [facility]

## 2015-05-08 NOTE — Addendum Note (Signed)
Addended by: Lilia Argue R on: 05/08/2015 03:02 PM   Modules accepted: Level of Service

## 2015-05-14 ENCOUNTER — Encounter: Payer: Self-pay | Admitting: *Deleted

## 2015-05-14 DIAGNOSIS — M722 Plantar fascial fibromatosis: Secondary | ICD-10-CM

## 2015-05-14 NOTE — Patient Instructions (Signed)
[facility] Dr Doran Durand Friday, Sept. 23rd at 12:30pm [address] [place], [place], [place] [address] Phone: [phone]

## 2015-07-07 DIAGNOSIS — I2692 Saddle embolus of pulmonary artery without acute cor pulmonale: Secondary | ICD-10-CM

## 2015-07-07 HISTORY — DX: Saddle embolus of pulmonary artery without acute cor pulmonale: I26.92

## 2015-07-28 ENCOUNTER — Telehealth: Payer: Self-pay | Admitting: Internal Medicine

## 2015-07-28 ENCOUNTER — Inpatient Hospital Stay (HOSPITAL_COMMUNITY)
Admission: AD | Admit: 2015-07-28 | Discharge: 2015-07-30 | DRG: 175 | Disposition: A | Discharge status: Home Health | Payer: Managed Care, Other (non HMO) | Source: Other Acute Inpatient Hospital | Attending: Internal Medicine | Admitting: Internal Medicine

## 2015-07-28 ENCOUNTER — Encounter (HOSPITAL_COMMUNITY): Payer: Self-pay

## 2015-07-28 DIAGNOSIS — K219 Gastro-esophageal reflux disease without esophagitis: Secondary | ICD-10-CM | POA: Diagnosis present

## 2015-07-28 DIAGNOSIS — E785 Hyperlipidemia, unspecified: Secondary | ICD-10-CM | POA: Diagnosis not present

## 2015-07-28 DIAGNOSIS — J80 Acute respiratory distress syndrome: Secondary | ICD-10-CM | POA: Diagnosis not present

## 2015-07-28 DIAGNOSIS — I10 Essential (primary) hypertension: Secondary | ICD-10-CM

## 2015-07-28 DIAGNOSIS — I2699 Other pulmonary embolism without acute cor pulmonale: Secondary | ICD-10-CM | POA: Diagnosis not present

## 2015-07-28 DIAGNOSIS — B20 Human immunodeficiency virus [HIV] disease: Secondary | ICD-10-CM | POA: Diagnosis present

## 2015-07-28 DIAGNOSIS — I2692 Saddle embolus of pulmonary artery without acute cor pulmonale: Secondary | ICD-10-CM | POA: Diagnosis not present

## 2015-07-28 DIAGNOSIS — R0602 Shortness of breath: Secondary | ICD-10-CM | POA: Diagnosis present

## 2015-07-28 MED ORDER — HYDROMORPHONE HCL 1 MG/ML IJ SOLN: 0.5000 mg | INTRAMUSCULAR | Status: DC | PRN

## 2015-07-28 MED ORDER — OXYCODONE HCL 5 MG PO TABS
5.0000 mg | ORAL_TABLET | ORAL | Status: DC | PRN
Start: 2015-07-28 — End: 2015-07-30

## 2015-07-28 MED ORDER — ACETAMINOPHEN 650 MG RE SUPP: 650.0000 mg | Freq: Four times a day (QID) | RECTAL | Status: DC | PRN

## 2015-07-28 MED ORDER — SODIUM CHLORIDE 0.9 % IJ SOLN
3.0000 mL | Freq: Two times a day (BID) | INTRAMUSCULAR | Status: DC
  Administered 2015-07-28 – 2015-07-29 (×2): 3 mL via INTRAVENOUS

## 2015-07-28 MED ORDER — SODIUM CHLORIDE 0.9 % IJ SOLN: 3.0000 mL | INTRAMUSCULAR | Status: DC | PRN

## 2015-07-28 MED ORDER — ONDANSETRON HCL 4 MG PO TABS: 4.0000 mg | ORAL_TABLET | Freq: Four times a day (QID) | ORAL | Status: DC | PRN

## 2015-07-28 MED ORDER — SODIUM CHLORIDE 0.9 % IV SOLN: 250.0000 mL | INTRAVENOUS | Status: DC | PRN

## 2015-07-28 MED ORDER — ALUM & MAG HYDROXIDE-SIMETH 200-200-20 MG/5ML PO SUSP
30.0000 mL | Freq: Four times a day (QID) | ORAL | Status: DC | PRN
Start: 2015-07-28 — End: 2015-07-30

## 2015-07-28 MED ORDER — ACETAMINOPHEN 325 MG PO TABS: 650.0000 mg | ORAL_TABLET | Freq: Four times a day (QID) | ORAL | Status: DC | PRN

## 2015-07-28 MED ORDER — SODIUM CHLORIDE 0.9 % IJ SOLN
3.0000 mL | Freq: Two times a day (BID) | INTRAMUSCULAR | Status: DC
  Administered 2015-07-30: 3 mL via INTRAVENOUS

## 2015-07-28 MED ORDER — ONDANSETRON HCL 4 MG/2ML IJ SOLN: 4.0000 mg | Freq: Four times a day (QID) | INTRAMUSCULAR | Status: DC | PRN

## 2015-07-28 MED ORDER — ENOXAPARIN SODIUM 80 MG/0.8ML ~~LOC~~ SOLN
1.0000 mg/kg | Freq: Two times a day (BID) | SUBCUTANEOUS | Status: DC
  Administered 2015-07-29 – 2015-07-30 (×3): 80 mg via SUBCUTANEOUS
  Filled 2015-07-28 (×3): qty 0.8

## 2015-07-28 NOTE — Telephone Encounter (Signed)
Accepted patient in transfer from [facility] ED for a stepdown bed for PE.   - If patient arrives after 7 pm, please do not page the admitting MD Dr. Cruzita Lederer, but call [phone] or [phone] and let the patient placement RN that the patient has arrived and the hospitalist on call will be notified to complete patient's admission  Costin M. Cruzita Lederer, MD [facility] [phone]

## 2015-07-28 NOTE — H&P (Signed)
[facility] Admission History and Physical       Michael [name] STM:[id number] DOB: [birth year] DOA: 07/28/2015  Referring physician: EDP PCP: Rachell Cipro, MD  Specialists:   Chief Complaint: SOB  HPI: Michael [name] is a 59 y.o. male with a history of HTN, Hyperlipidemia, and HIV disease who presented to the [facility] ED in [place] with worsening SOB and DOE x 2-3 days.  He denies any Chest pain or Pleuritic Chest Pain.   He had a cast on his RLE removed for treatment of Plantar Fasciitis on Tuesday 07/24/2015.  He reports having transient cramps in his Right Calf 1 week ago.    He was evaluated and found to have a  Saddle Pul monary Embolus on CTA of the Chest with evidence of RV Strain.  Patient was administered Full dose Lovenox and per his request he was transferred to [facility] since he lives here.     Of Note After Shoulder Surgery last year 05/12. 2015 he had Multiple Pulmonary Emboli and was treated with Lovenox and transitioned to  Coumadin Therapy x 6 months.       Review of Systems:  Constitutional: No Weight Loss, No Weight Gain, Night Sweats, Fevers, Chills, Dizziness, Light Headedness, Fatigue, or Generalized Weakness HEENT: No Headaches, Difficulty Swallowing,Tooth/Dental Problems,Sore Throat,  No Sneezing, Rhinitis, Ear Ache, Nasal Congestion, or Post Nasal Drip,  Cardio-vascular:  No Chest pain, Orthopnea, PND, Edema in Lower Extremities, Anasarca, Dizziness, Palpitations  Resp: +Dyspnea, +DOE, No Productive Cough, No Non-Productive Cough, No Hemoptysis, No Wheezing.    GI: No Heartburn, Indigestion, Abdominal Pain, Nausea, Vomiting, Diarrhea, Constipation, Hematemesis, Hematochezia, Melena, Change in Bowel Habits,  Loss of Appetite  GU: No Dysuria, No Change in Color of Urine, No Urgency or Urinary Frequency, No Flank pain.  Musculoskeletal: No Joint Pain or Swelling, No Decreased Range of Motion, No Back Pain.  Neurologic: No Syncope, No  Seizures, Muscle Weakness, Paresthesia, Vision Disturbance or Loss, No Diplopia, No Vertigo, No Difficulty Walking,  Skin: No Rash or Lesions. Psych: No Change in Mood or Affect, No Depression or Anxiety, No Memory loss, No Confusion, or Hallucinations   Past Medical History  Diagnosis Date  . HTN (hypertension)   . Hyperlipidemia   . HIV (human immunodeficiency virus infection)   . GERD (gastroesophageal reflux disease)      Past Surgical History  Procedure Laterality Date  . Rotator cuff surgery      right and left  . Laminectomy  2010    C4/C5  . Appendectomy    . Tonsillectomy    . Colonoscopy    . Shoulder arthroscopy with subacromial decompression Right 12/15/2013    Procedure: RIGHT SHOULDER ARTHROSCOPY WITH EXTENSIVE DEBRIDEMENT OF ROTATOR CUFF,SUBACROMIAL DECOMPRESSION, DEBRIDMENT OF BURSA;  Surgeon: Ninetta Lights, MD;  Location: [place];  Service: Orthopedics;  Laterality: Right;      Prior to Admission medications   Medication Sig Start Date End Date Taking? Authorizing Provider  ALPRAZolam (XANAX XR) 3 MG 24 hr tablet Take 3 mg by mouth daily.      Historical Provider, MD  ALPRAZOLAM XR 1 MG 24 hr tablet  01/19/15   Historical Provider, MD  atorvastatin (LIPITOR) 10 MG tablet Take 10 mg by mouth daily.      Historical Provider, MD  BECONASE AQ 42 MCG/SPRAY nasal spray Place 1 spray into the nose daily as needed for allergies.  12/29/12   Historical Provider, MD  buPROPion (WELLBUTRIN XL)  150 MG 24 hr tablet  11/07/14   Historical Provider, MD  darunavir-cobicistat (PREZCOBIX) 800-150 MG per tablet Take 1 tablet by mouth daily. Swallow whole. Do NOT crush, break or chew tablets. Take with food.    Historical Provider, MD  dolutegravir (TIVICAY) 50 MG tablet Take 50 mg by mouth daily.    Historical Provider, MD  emtricitabine-tenofovir (TRUVADA) 200-300 MG per tablet Take 1 tablet by mouth daily.      Historical Provider, MD    enalapril-hydrochlorothiazide (VASERETIC) 10-25 MG per tablet Take 1 tablet by mouth daily. 12/09/12   Historical Provider, MD  esomeprazole ([place]) 20 MG capsule  12/07/14   Historical Provider, MD  LOVAZA 1 G capsule Take 2 capsules by mouth daily. 04/29/13   Historical Provider, MD  montelukast (SINGULAIR) 10 MG tablet Once tablet at bedtime 08/17/14   Historical Provider, MD  nitroGLYCERIN (NITRODUR - DOSED IN MG/24 HR) 0.2 mg/hr patch Apply 1/4 patch to affected area daily.  Change patch every 24 hours. Patient not taking: Reported on 01/10/2015 01/01/15   Dickie La, MD  omega-3 acid ethyl esters (LOVAZA) 1 G capsule Take by mouth. 12/07/14   Historical Provider, MD  valACYclovir (VALTREX) 500 MG tablet Take 500 mg by mouth daily.  01/28/13   Historical Provider, MD     Allergies  Allergen Reactions  . Efavirenz Rash    Social History:  reports that he has never smoked. He has never used smokeless tobacco. He reports that he drinks alcohol. He reports that he does not use illicit drugs.    Family History  Problem Relation Age of Onset  . Diabetes Mother   . Heart attack Paternal Grandfather   . Deep vein thrombosis Father        Physical Exam:  GEN:  Pleasant Well Nourished and Well Developed  59 y.o. Caucasian male examined and in no acute distress; cooperative with exam Filed Vitals:   07/28/15 2020  BP: 135/82  Pulse: 91  Temp: 97.8 F (36.6 C)  TempSrc: Oral  Resp: 18  SpO2: 95%   Blood pressure 135/82, pulse 91, temperature 97.8 F (36.6 C), temperature source Oral, resp. rate 18, SpO2 95 %. PSYCH: He is alert and oriented x4; does not appear anxious does not appear depressed; affect is normal HEENT: Normocephalic and Atraumatic, Mucous membranes pink; PERRLA; EOM intact; Fundi:  Benign;  No scleral icterus, Nares: Patent, Oropharynx: Clear,  Fair Dentition,    Neck:  FROM, No Cervical Lymphadenopathy nor Thyromegaly or Carotid Bruit; No JVD; Breasts:: Not  examined CHEST WALL: No tenderness CHEST: Normal respiration, clear to auscultation bilaterally HEART: Regular rate and rhythm; no murmurs rubs or gallops BACK: No kyphosis or scoliosis; No CVA tenderness ABDOMEN: Positive Bowel Sounds, Soft Non-Tender, No Rebound or Guarding; No Masses, No Organomegaly. Rectal Exam: Not done EXTREMITIES: No Cyanosis, Clubbing, or Edema; No Ulcerations. Genitalia: not examined PULSES: 2+ and symmetric SKIN: Normal hydration no rash or ulceration CNS:  Alert and Oriented x 4, No Focal Deficits Vascular: pulses palpable throughout    Labs on Admission:  Basic Metabolic Panel: No results for input(s): NA, K, CL, CO2, GLUCOSE, BUN, CREATININE, CALCIUM, MG, PHOS in the last 168 hours. Liver Function Tests: No results for input(s): AST, ALT, ALKPHOS, BILITOT, PROT, ALBUMIN in the last 168 hours. No results for input(s): LIPASE, AMYLASE in the last 168 hours. No results for input(s): AMMONIA in the last 168 hours. CBC: No results for input(s): WBC, NEUTROABS, HGB, HCT, MCV,  PLT in the last 168 hours. Cardiac Enzymes: No results for input(s): CKTOTAL, CKMB, CKMBINDEX, TROPONINI in the last 168 hours.  BNP (last 3 results) No results for input(s): BNP in the last 8760 hours.  ProBNP (last 3 results) No results for input(s): PROBNP in the last 8760 hours.  CBG: No results for input(s): GLUCAP in the last 168 hours.  Radiological Exams on Admission: No results found.     EKG: Independently reviewed.     Assessment/Plan:      59 y.o. male with  Principal Problem:   1.    Pulmonary embolism (HCC)   Full dose Lovenox   Transition to Coumadin or  Newer Agent Anticoagulant if compatible with his HAART Rx   Pulmonary consulted to see in AM   Active Problems:   2.    HIV DISEASE   Continue HAART  Rx   Consult ID in AM     3.    HTN (hypertension)   Continue Enalapril/HCTZ  Rx   Monitor BPs     4.    Hyperlipidemia   Continue   Atorvastatin and Omega 3 Fatty Acids     5.    DVT Prophylaxis   On Full dose Lovenox Rx    Code Status:     FULL CODE        Family Communication:   Husband at Bedside    Disposition Plan:    Inpatient  Status        Time spent:  [facility] Pager [phone]   If [place] Please Contact the Day Rounding Team MD for [facility]  If 7PM-7AM, Please Contact Night-Floor Coverage  [email or web address] Password [email or web address] 07/28/2015, 9:40 PM     ADDENDUM:   Patient was seen and examined on 07/28/2015

## 2015-07-29 DIAGNOSIS — I2699 Other pulmonary embolism without acute cor pulmonale: Secondary | ICD-10-CM

## 2015-07-29 DIAGNOSIS — I10 Essential (primary) hypertension: Secondary | ICD-10-CM | POA: Diagnosis present

## 2015-07-29 DIAGNOSIS — E785 Hyperlipidemia, unspecified: Secondary | ICD-10-CM | POA: Diagnosis present

## 2015-07-29 LAB — ANTITHROMBIN III: ANTITHROMB III FUNC: 100 % (ref 75–120)

## 2015-07-29 LAB — BASIC METABOLIC PANEL
Anion gap: 10 (ref 5–15)
BUN: 17 mg/dL (ref 6–20)
CHLORIDE: 100 mmol/L — AB (ref 101–111)
CO2: 27 mmol/L (ref 22–32)
CREATININE: 1.31 mg/dL — AB (ref 0.61–1.24)
Calcium: 9.2 mg/dL (ref 8.9–10.3)
GFR calc Af Amer: >60 mL/min (ref >60)
GFR calc non Af Amer: 57 mL/min — ABNORMAL LOW (ref >60)
Glucose, Bld: 110 mg/dL — ABNORMAL HIGH (ref 65–99)
POTASSIUM: 3.7 mmol/L (ref 3.5–5.1)
Sodium: 137 mmol/L (ref 135–145)

## 2015-07-29 LAB — BRAIN NATRIURETIC PEPTIDE: B Natriuretic Peptide: 313.6 pg/mL — ABNORMAL HIGH (ref 0.0–100.0)

## 2015-07-29 LAB — CBC
HEMATOCRIT: 44.6 % (ref 39.0–52.0)
HEMOGLOBIN: 16.1 g/dL (ref 13.0–17.0)
MCH: 34.8 pg — ABNORMAL HIGH (ref 26.0–34.0)
MCHC: 36.1 g/dL — ABNORMAL HIGH (ref 30.0–36.0)
MCV: 96.5 fL (ref 78.0–100.0)
PLATELETS: 107 10*3/uL — AB (ref 150–400)
RBC: 4.62 MIL/uL (ref 4.22–5.81)
RDW: 12.8 % (ref 11.5–15.5)
WBC: 9.2 10*3/uL (ref 4.0–10.5)

## 2015-07-29 LAB — PROTIME-INR
INR: 1.13 (ref 0.00–1.49)
Prothrombin Time: 14.7 seconds (ref 11.6–15.2)

## 2015-07-29 LAB — TROPONIN I: TROPONIN I: 0.07 ng/mL — AB (ref <0.031)

## 2015-07-29 MED ORDER — ATORVASTATIN CALCIUM 10 MG PO TABS
10.0000 mg | ORAL_TABLET | Freq: Every day | ORAL | Status: DC
  Administered 2015-07-29: 10 mg via ORAL
  Filled 2015-07-29: qty 1

## 2015-07-29 MED ORDER — HYDROCHLOROTHIAZIDE 25 MG PO TABS
25.0000 mg | ORAL_TABLET | Freq: Every day | ORAL | Status: DC
  Administered 2015-07-29 – 2015-07-30 (×2): 25 mg via ORAL
  Filled 2015-07-29 (×2): qty 1

## 2015-07-29 MED ORDER — ALPRAZOLAM 0.5 MG PO TABS
1.0000 mg | ORAL_TABLET | Freq: Three times a day (TID) | ORAL | Status: DC
  Administered 2015-07-29 – 2015-07-30 (×4): 1 mg via ORAL
  Filled 2015-07-29: qty 2
  Filled 2015-07-29: qty 4
  Filled 2015-07-29 (×2): qty 2

## 2015-07-29 MED ORDER — VALACYCLOVIR HCL 500 MG PO TABS
500.0000 mg | ORAL_TABLET | Freq: Every day | ORAL | Status: DC
  Administered 2015-07-29 – 2015-07-30 (×2): 500 mg via ORAL
  Filled 2015-07-29 (×2): qty 1

## 2015-07-29 MED ORDER — ENALAPRIL-HYDROCHLOROTHIAZIDE 10-25 MG PO TABS: 1.0000 | ORAL_TABLET | Freq: Every day | ORAL | Status: DC

## 2015-07-29 MED ORDER — EMTRICITABINE-TENOFOVIR AF 200-25 MG PO TABS
1.0000 | ORAL_TABLET | Freq: Every day | ORAL | Status: DC
  Administered 2015-07-29 – 2015-07-30 (×2): 1 via ORAL
  Filled 2015-07-29 (×2): qty 1

## 2015-07-29 MED ORDER — ALPRAZOLAM ER 1 MG PO TB24: 3.0000 mg | ORAL_TABLET | Freq: Every day | ORAL | Status: DC

## 2015-07-29 MED ORDER — OMEGA-3-ACID ETHYL ESTERS 1 G PO CAPS
1.0000 g | ORAL_CAPSULE | Freq: Two times a day (BID) | ORAL | Status: DC
  Administered 2015-07-29 – 2015-07-30 (×3): 1 g via ORAL
  Filled 2015-07-29 (×3): qty 1

## 2015-07-29 MED ORDER — PANTOPRAZOLE SODIUM 40 MG PO TBEC
40.0000 mg | DELAYED_RELEASE_TABLET | Freq: Every day | ORAL | Status: DC
  Administered 2015-07-29 – 2015-07-30 (×2): 40 mg via ORAL
  Filled 2015-07-29 (×2): qty 1

## 2015-07-29 MED ORDER — MONTELUKAST SODIUM 10 MG PO TABS
10.0000 mg | ORAL_TABLET | Freq: Every day | ORAL | Status: DC
  Administered 2015-07-29: 10 mg via ORAL
  Filled 2015-07-29: qty 1

## 2015-07-29 MED ORDER — ENALAPRIL MALEATE 10 MG PO TABS
10.0000 mg | ORAL_TABLET | Freq: Every day | ORAL | Status: DC
  Administered 2015-07-29 – 2015-07-30 (×2): 10 mg via ORAL
  Filled 2015-07-29 (×2): qty 1

## 2015-07-29 MED ORDER — POLYETHYLENE GLYCOL 3350 17 G PO PACK: 17.0000 g | PACK | Freq: Every day | ORAL | Status: DC | PRN

## 2015-07-29 MED ORDER — SODIUM CHLORIDE 0.9 % IV BOLUS (SEPSIS)
1000.0000 mL | Freq: Once | INTRAVENOUS | Status: AC
  Administered 2015-07-29: 1000 mL via INTRAVENOUS

## 2015-07-29 MED ORDER — POLYETHYLENE GLYCOL 3350 17 G PO PACK
17.0000 g | PACK | Freq: Every day | ORAL | Status: DC
  Administered 2015-07-29: 17 g via ORAL
  Filled 2015-07-29: qty 1

## 2015-07-29 MED ORDER — DARUNAVIR-COBICISTAT 800-150 MG PO TABS
1.0000 | ORAL_TABLET | Freq: Every day | ORAL | Status: DC
  Administered 2015-07-29 – 2015-07-30 (×2): 1 via ORAL
  Filled 2015-07-29 (×3): qty 1

## 2015-07-29 MED ORDER — SACCHAROMYCES BOULARDII 250 MG PO CAPS
250.0000 mg | ORAL_CAPSULE | Freq: Two times a day (BID) | ORAL | Status: DC
  Administered 2015-07-29 – 2015-07-30 (×3): 250 mg via ORAL
  Filled 2015-07-29 (×3): qty 1

## 2015-07-29 MED ORDER — ALIGN PO CAPS: 1.0000 | ORAL_CAPSULE | Freq: Every day | ORAL | Status: DC

## 2015-07-29 MED ORDER — DOLUTEGRAVIR SODIUM 50 MG PO TABS
50.0000 mg | ORAL_TABLET | Freq: Every day | ORAL | Status: DC
  Administered 2015-07-29 – 2015-07-30 (×2): 50 mg via ORAL
  Filled 2015-07-29 (×2): qty 1

## 2015-07-29 NOTE — Progress Notes (Signed)
MD was notified about patient's troponin level.

## 2015-07-29 NOTE — Consult Note (Signed)
Name: Michael [name] MRN: [id number] DOB: 1955/12/06    ADMISSION DATE:  07/28/2015 CONSULTATION DATE:  07/29/15  REFERRING MD :  Aileen Fass  CHIEF COMPLAINT:  Dyspnea, PE  BRIEF PATIENT DESCRIPTION:  Admitted with dyspnea. Found to have a PE with RV strain.  SIGNIFICANT EVENTS  10/22- Admit with PE  STUDIES:   HISTORY OF PRESENT ILLNESS:  59 Y/O with HTN, hyperlipidemia, HIV PE in past presents with dyspnea, for 3 days. He had a cast on Rt LE last week for plantar fascitis. He had cramping pain in leg after that. Also had dyspnea after his exercise session last week. He took a road trip to [facility] yesterday and was was more dyspneic after that. He went to the ED in [place] and was diagnosed with PE with RV strain. He was then transferred to [facility] for management.  He has history of PE in 2015 after a shoulder surgery. He was treated with 6 months of anticoagulation at that time. Echo at time did not show any RV strain or pulmonary hypertension.  PAST MEDICAL HISTORY :   has a past medical history of HTN (hypertension); Hyperlipidemia; HIV (human immunodeficiency virus infection) ([place]); and GERD (gastroesophageal reflux disease).  has past surgical history that includes rotator cuff surgery; Laminectomy (2010); Appendectomy; Tonsillectomy; Colonoscopy; and Shoulder arthroscopy with subacromial decompression (Right, 12/15/2013). Prior to Admission medications   Medication Sig Start Date End Date Taking? Authorizing Provider  acetaminophen (TYLENOL) 500 MG tablet Take 1,000 mg by mouth every 6 (six) hours as needed for headache.   Yes Historical Provider, MD  ALPRAZOLAM XR 1 MG 24 hr tablet Take 3 mg by mouth daily.  01/19/15  Yes Historical Provider, MD  atorvastatin (LIPITOR) 10 MG tablet Take 10 mg by mouth at bedtime.    Yes Historical Provider, MD  beclomethasone (BECONASE AQ) 42 MCG/SPRAY nasal spray Place 2 sprays into both nostrils daily as needed (seasonal allergies (fall  season)). Dose is for each nostril.   Yes Historical Provider, MD  bifidobacterium infantis (ALIGN) capsule Take 1 capsule by mouth daily.   Yes Historical Provider, MD  darunavir-cobicistat (PREZCOBIX) 800-150 MG per tablet Take 1 tablet by mouth daily. Swallow whole. Do NOT crush, break or chew tablets. Take with food.   Yes Historical Provider, MD  dolutegravir (TIVICAY) 50 MG tablet Take 50 mg by mouth daily.   Yes Historical Provider, MD  emtricitabine-tenofovir AF (DESCOVY) 200-25 MG tablet Take 1 tablet by mouth daily.   Yes Historical Provider, MD  enalapril-hydrochlorothiazide (VASERETIC) 10-25 MG per tablet Take 1 tablet by mouth daily. 12/09/12  Yes Historical Provider, MD  esomeprazole (NEXIUM) 20 MG capsule Take 20 mg by mouth daily.  12/07/14  Yes Historical Provider, MD  ibuprofen (ADVIL,MOTRIN) 200 MG tablet Take 400 mg by mouth every 6 (six) hours as needed (pain).   Yes Historical Provider, MD  ketotifen (ZADITOR) 0.025 % ophthalmic solution Place 1 drop into both eyes daily as needed (itching).   Yes Historical Provider, MD  montelukast (SINGULAIR) 10 MG tablet Take 10 mg by mouth at bedtime. Once tablet at bedtime 08/17/14  Yes Historical Provider, MD  omega-3 acid ethyl esters (LOVAZA) 1 G capsule Take 1 g by mouth 2 (two) times daily.   Yes Historical Provider, MD  valACYclovir (VALTREX) 500 MG tablet Take 500 mg by mouth daily.  01/28/13  Yes Historical Provider, MD  nitroGLYCERIN (NITRODUR - DOSED IN MG/24 HR) 0.2 mg/hr patch Apply 1/4 patch to affected area  daily.  Change patch every 24 hours. Patient not taking: Reported on 01/10/2015 01/01/15   Dickie La, MD   Allergies  Allergen Reactions  . Efavirenz Rash    FAMILY HISTORY:  family history includes Deep vein thrombosis in his father; Diabetes in his mother; Heart attack in his paternal grandfather. SOCIAL HISTORY:  reports that he has never smoked. He has never used smokeless tobacco. He reports that he drinks alcohol.  He reports that he does not use illicit drugs.  REVIEW OF SYSTEMS:   Constitutional: Negative for fever, chills, weight loss, malaise/fatigue and diaphoresis.  HENT: Negative for hearing loss, ear pain, nosebleeds, congestion, sore throat, neck pain, tinnitus and ear discharge.   Eyes: Negative for blurred vision, double vision, photophobia, pain, discharge and redness.  Respiratory: Negative for cough, hemoptysis, sputum production, shortness of breath, wheezing and stridor.   Cardiovascular: Negative for chest pain, palpitations, orthopnea, claudication, leg swelling and PND.  Gastrointestinal: Negative for heartburn, nausea, vomiting, abdominal pain, diarrhea, constipation, blood in stool and melena.  Genitourinary: Negative for dysuria, urgency, frequency, hematuria and flank pain.  Musculoskeletal: Negative for myalgias, back pain, joint pain and falls.  Skin: Negative for itching and rash.  Neurological: Negative for dizziness, tingling, tremors, sensory change, speech change, focal weakness, seizures, loss of consciousness, weakness and headaches.  Endo/Heme/Allergies: Negative for environmental allergies and polydipsia. Does not bruise/bleed easily.  SUBJECTIVE:   VITAL SIGNS: Temp:  [97.5 F (36.4 C)-98.5 F (36.9 C)] 98.5 F (36.9 C) (10/23 3009) Pulse Rate:  [66-91] 66 (10/23 0608) Resp:  [16-18] 16 (10/23 2330) BP: (112-135)/(69-82) 119/78 mmHg (10/23 0608) SpO2:  [95 %-98 %] 98 % (10/23 0608) Weight:  [171 lb 6.4 oz (77.747 kg)] 171 lb 6.4 oz (77.747 kg) (10/22 2148)  PHYSICAL EXAMINATION: General:  Awake, Oriented, No distress Neuro:  No focal deficits HEENT:  PERRL, Moist mucus membranes Cardiovascular:  S1, S2 heard. No MRG Lungs:  Clear to auscultation, no wheeze or gallops Abdomen:  Soft, + BS Skin:  Intact   Recent Labs Lab 07/29/15 0520  NA 137  K 3.7  CL 100*  CO2 27  BUN 17  CREATININE 1.31*  GLUCOSE 110*    Recent Labs Lab 07/29/15 0520  HGB  16.1  HCT 44.6  WBC 9.2  PLT PENDING   No results found.  ASSESSMENT / PLAN: Recurrent PE with RV strain.  Although this appears provoked after his Rt LE cast for plantar fascitis and road trip to [place], it is his second PE ssince 2015. He is hemodynamically stable and does not meet the the criteria for TPA. He is on anticoagulation for now and will likely need life long therapy. He prefers to use Xarelto.  Plan: - Check LE doppler - Echocardiogram, troponin and BNP. - Continue lovenox for now. Can transition to Xarelto on discharge - Repeat hypercoaguable panel. - No indication for TPA.  Marshell Garfinkel MD [facility] Pager [phone] If no answer or after 3pm call: [phone] 07/29/2015, 9:15 AM

## 2015-07-29 NOTE — Progress Notes (Signed)
[facility] PROGRESS NOTE    Progress Note   Michael [name] INO:[id number] DOB: 03-27-1956 DOA: 07/28/2015 PCP: Rachell Cipro, MD   Brief Narrative:   Michael [name] is an 59 y.o. male 59 year old with past medical history of essential hypertension PE on and HIV that was transferred from [place] because of shortness of breath for 2-3 days due to acute pulmonary embolism.  Assessment/Plan:  Acute Saddle pulmonary embolism ([place]) - With a history of PE she was previously on Eluquis, I have reviewed charts from the [facility], CT Angio saddle pulmonary embolism. Lower extremity DVT and 2-D echo rule out right heart strain. - Patient has agreed to go on Xarelto. We'll discuss with pharmacy and ID to see her there is a contraindication on using new oral anticoagulants.  HIV DISEASE Continue HIV medications.  Essential hypertension - Continue current home regimen.  Hyperlipidemia No change cont statins   DVT Prophylaxis - Lovenox ordered.  Family Communication: none Disposition Plan: Home when stable. Code Status:     Code Status Orders        Start     Ordered   07/28/15 2137  Full code   Continuous     07/28/15 2137        IV Access:    Peripheral IV   Procedures and diagnostic studies:   No results found.   Medical Consultants:    None.  Anti-Infectives:   Anti-infectives    Start     Dose/Rate Route Frequency Ordered Stop   07/29/15 1000  dolutegravir (TIVICAY) tablet 50 mg     50 mg Oral Daily 07/29/15 0250     07/29/15 1000  emtricitabine-tenofovir AF (DESCOVY) 200-25 MG per tablet 1 tablet     1 tablet Oral Daily 07/29/15 0250     07/29/15 1000  valACYclovir (VALTREX) tablet 500 mg     500 mg Oral Daily 07/29/15 0250     07/29/15 0800  darunavir-cobicistat (PREZCOBIX) 800-150 MG per tablet 1 tablet     1 tablet Oral Daily before breakfast 07/29/15 0250        Subjective:    Michael [name] shortness of breath is improved.  He also relates that his pleuritic chest pain is better.  Objective:    Filed Vitals:   07/28/15 2020 07/28/15 2148 07/29/15 0243 07/29/15 0608  BP: 135/82  112/69 119/78  Pulse: 91  77 66  Temp: 97.8 F (36.6 C)  97.5 F (36.4 C) 98.5 F (36.9 C)  TempSrc: Oral  Oral Oral  Resp: 18  16 16   Height:  5\' 5"  (1.651 m)    Weight:  77.747 kg (171 lb 6.4 oz)    SpO2: 95%  96% 98%    Intake/Output Summary (Last 24 hours) at 07/29/15 0815 Last data filed at 07/29/15 0729  Gross per 24 hour  Intake    343 ml  Output    420 ml  Net    -77 ml   Filed Weights   07/28/15 2148  Weight: 77.747 kg (171 lb 6.4 oz)    Exam: Gen:  NAD Cardiovascular:  RRR, No M/R/G Chest and lungs:   CTAB Abdomen:  Abdomen soft, NT/ND, + BS Extremities:  No C/E/C   Data Reviewed:    Labs: Basic Metabolic Panel:  Recent Labs Lab 07/29/15 0520  NA 137  K 3.7  CL 100*  CO2 27  GLUCOSE 110*  BUN 17  CREATININE 1.31*  CALCIUM 9.2   GFR Estimated  Creatinine Clearance: 57 mL/min (by C-G formula based on Cr of 1.31). Liver Function Tests: No results for input(s): AST, ALT, ALKPHOS, BILITOT, PROT, ALBUMIN in the last 168 hours. No results for input(s): LIPASE, AMYLASE in the last 168 hours. No results for input(s): AMMONIA in the last 168 hours. Coagulation profile  Recent Labs Lab 07/29/15 0520  INR 1.13    CBC:  Recent Labs Lab 07/29/15 0520  WBC 9.2  HGB 16.1  HCT 44.6  MCV 96.5  PLT PENDING   Cardiac Enzymes: No results for input(s): CKTOTAL, CKMB, CKMBINDEX, TROPONINI in the last 168 hours. BNP (last 3 results) No results for input(s): PROBNP in the last 8760 hours. CBG: No results for input(s): GLUCAP in the last 168 hours. D-Dimer: No results for input(s): DDIMER in the last 72 hours. Hgb A1c: No results for input(s): HGBA1C in the last 72 hours. Lipid Profile: No results for input(s): CHOL, HDL, LDLCALC, TRIG, CHOLHDL, LDLDIRECT in the last 72 hours. Thyroid  function studies: No results for input(s): TSH, T4TOTAL, T3FREE, THYROIDAB in the last 72 hours.  Invalid input(s): FREET3 Anemia work up: No results for input(s): VITAMINB12, FOLATE, FERRITIN, TIBC, IRON, RETICCTPCT in the last 72 hours. Sepsis Labs:  Recent Labs Lab 07/29/15 0520  WBC 9.2   Microbiology No results found for this or any previous visit (from the past 240 hour(s)).   Medications:   . ALPRAZolam  1 mg Oral TID  . atorvastatin  10 mg Oral QHS  . darunavir-cobicistat  1 tablet Oral QAC breakfast  . dolutegravir  50 mg Oral Daily  . emtricitabine-tenofovir AF  1 tablet Oral Daily  . enalapril  10 mg Oral Daily   And  . hydrochlorothiazide  25 mg Oral Daily  . enoxaparin (LOVENOX) injection  1 mg/kg Subcutaneous Q12H  . montelukast  10 mg Oral QHS  . omega-3 acid ethyl esters  1 g Oral BID  . pantoprazole  40 mg Oral Daily  . saccharomyces boulardii  250 mg Oral BID  . sodium chloride  3 mL Intravenous Q12H  . sodium chloride  3 mL Intravenous Q12H  . valACYclovir  500 mg Oral Daily   Continuous Infusions:   Time spent: 2 80min   LOS: 1 day   Charlynne Cousins  [facility] Pager [phone]  *Please refer to [email or web address], password [email or web address] to get updated schedule on who will round on this patient, as hospitalists switch teams weekly. If 7PM-7AM, please contact night-coverage at [email or web address], password [email or web address] for any overnight needs.  07/29/2015, 8:15 AM

## 2015-07-30 ENCOUNTER — Inpatient Hospital Stay (HOSPITAL_COMMUNITY): Payer: Managed Care, Other (non HMO)

## 2015-07-30 DIAGNOSIS — J80 Acute respiratory distress syndrome: Secondary | ICD-10-CM

## 2015-07-30 DIAGNOSIS — B20 Human immunodeficiency virus [HIV] disease: Secondary | ICD-10-CM

## 2015-07-30 DIAGNOSIS — I2699 Other pulmonary embolism without acute cor pulmonale: Secondary | ICD-10-CM

## 2015-07-30 LAB — PROTIME-INR
INR: 1.13 (ref 0.00–1.49)
Prothrombin Time: 14.7 seconds (ref 11.6–15.2)

## 2015-07-30 MED ORDER — WARFARIN SODIUM 7.5 MG PO TABS: 7.5000 mg | ORAL_TABLET | Freq: Once | ORAL | Status: DC

## 2015-07-30 MED ORDER — ENOXAPARIN SODIUM 80 MG/0.8ML ~~LOC~~ SOLN: 1.0000 mg/kg | Freq: Once | SUBCUTANEOUS | Status: DC

## 2015-07-30 MED ORDER — ENOXAPARIN SODIUM 120 MG/0.8ML ~~LOC~~ SOLN: 120.0000 mg | SUBCUTANEOUS | Status: DC

## 2015-07-30 MED ORDER — ENOXAPARIN SODIUM 120 MG/0.8ML ~~LOC~~ SOLN
120.0000 mg | SUBCUTANEOUS | Status: DC
  Filled 2015-07-30: qty 0.8

## 2015-07-30 MED ORDER — OXYCODONE HCL 5 MG PO TABS: 5.0000 mg | ORAL_TABLET | Freq: Four times a day (QID) | ORAL | Status: DC | PRN

## 2015-07-30 MED ORDER — PATIENT'S GUIDE TO USING COUMADIN BOOK
Freq: Once | Status: AC
  Administered 2015-07-30: 09:00:00
  Filled 2015-07-30: qty 1

## 2015-07-30 MED ORDER — WARFARIN VIDEO
Freq: Once | Status: AC
  Administered 2015-07-30: 09:00:00

## 2015-07-30 MED ORDER — WARFARIN - PHARMACIST DOSING INPATIENT: Freq: Every day | Status: DC

## 2015-07-30 NOTE — Progress Notes (Signed)
  Echocardiogram 2D Echocardiogram has been performed.  Michael [name] 07/30/2015, 12:22 PM

## 2015-07-30 NOTE — Progress Notes (Signed)
ANTICOAGULATION CONSULT NOTE - Initial Consult  Pharmacy Consult for Lovenox / Coumadin Indication: pulmonary embolus  Allergies  Allergen Reactions  . Efavirenz Rash    Patient Measurements: Height: 5\' 5"  (165.1 cm) Weight: 171 lb 6.4 oz (77.747 kg) IBW/kg (Calculated) : 61.5  Vital Signs: Temp: 97.5 F (36.4 C) (10/24 0458) Temp Source: Oral (10/24 0458) BP: 120/76 mmHg (10/24 0458) Pulse Rate: 64 (10/24 0458)  Labs:  Recent Labs  07/29/15 0520 07/29/15 1650  HGB 16.1  --   HCT 44.6  --   PLT 107*  --   LABPROT 14.7  --   INR 1.13  --   CREATININE 1.31*  --   TROPONINI  --  0.07*    Estimated Creatinine Clearance: 57 mL/min (by C-G formula based on Cr of 1.31).   Medical History: Past Medical History  Diagnosis Date  . HTN (hypertension)   . Hyperlipidemia   . HIV (human immunodeficiency virus infection) ([place])   . GERD (gastroesophageal reflux disease)    Assessment: 59 year old male on Lovenox for PE and to begin Coumadin today Patient is on prezcobix -> NOACs are contraindicated due to increased risk of bleeding (strong CYP3A4, Pgp inhibitor) (discussed with Dr. Allyson Sabal)  CBC stable Day 1 of 5 overlap  Goal of Therapy:  INR 2-3 Monitor platelets by anticoagulation protocol: Yes  Appropriate Lovenox dosing   Plan:  Lovenox 80 mg sq Q 12 hours Coumadin 7.5 mg po x 1 dose tonight Daily INR Coumadin education  Thank you Anette Guarneri, PharmD [phone]  Tad Moore 07/30/2015,9:06 AM

## 2015-07-30 NOTE — Progress Notes (Signed)
   Name: Michael [name] MRN: [id number] DOB: 01-05-1956    ADMISSION DATE:  07/28/2015 CONSULTATION DATE:  07/29/15  REFERRING MD :  Aileen Fass  CHIEF COMPLAINT:  Dyspnea, PE  BRIEF PATIENT DESCRIPTION:  Admitted with dyspnea. Found to have a PE with RV strain.  SIGNIFICANT EVENTS  10/22- Admit with PE  STUDIES:   HISTORY OF PRESENT ILLNESS:  59 Y/O with HTN, hyperlipidemia, HIV PE in past presents with dyspnea, for 3 days. He had a cast on Rt LE last week for plantar fascitis. He had cramping pain in leg after that. Also had dyspnea after his exercise session last week. He took a road trip to [facility] yesterday and was was more dyspneic after that. He went to the ED in [place] and was diagnosed with PE with RV strain. He was then transferred to [facility] for management.  He has history of PE in 2015 after a shoulder surgery. He was treated with 6 months of anticoagulation at that time. Echo at time did not show any RV strain or pulmonary hypertension.   SUBJECTIVE: denies CP, dyspnea afebrile  VITAL SIGNS: Temp:  [97.5 F (36.4 C)-98.7 F (37.1 C)] 97.5 F (36.4 C) (10/24 0458) Pulse Rate:  [64-82] 64 (10/24 0458) Resp:  [16-22] 16 (10/24 0458) BP: (98-120)/(57-76) 120/76 mmHg (10/24 0458) SpO2:  [95 %-98 %] 98 % (10/24 0458)  PHYSICAL EXAMINATION: General:  Awake, Oriented, No distress Neuro:  No focal deficits HEENT:  PERRL, Moist mucus membranes Cardiovascular:  S1, S2 heard. No MRG Lungs:  Clear to auscultation, no wheeze or gallops Abdomen:  Soft, + BS Skin:  Intact   Recent Labs Lab 07/29/15 0520  NA 137  K 3.7  CL 100*  CO2 27  BUN 17  CREATININE 1.31*  GLUCOSE 110*    Recent Labs Lab 07/29/15 0520  HGB 16.1  HCT 44.6  WBC 9.2  PLT 107*   No results found.  ASSESSMENT / PLAN: Recurrent PE with RV strain.  Although this appears provoked after his Rt LE cast for plantar fascitis and road trip to [place], it is his second PE since 2015. He  is hemodynamically stable and does not meet the the criteria for TPA. He is on anticoagulation for now and may need life long therapy. He prefers to use novel  agent  Plan: - Check LE doppler - Echocardiogram - Can transition to Xarelto or eliquis if no drug interaction per pharmacy -FU appt with PCCM on dc  Kara Mead MD. FCCP. [facility] Pager [phone] If no response call [phone]    07/30/2015, 8:35 AM

## 2015-07-30 NOTE — Care Management Note (Signed)
Case Management Note  Patient Details  Name: Jaydian T Quam MRN: [id number] Date of Birth: 09/01/1956  Subjective/Objective:                 Patient from home with spouse, independent. Patient states that he has met his deductable for this year and he been on Lovenox in the past and does not expect to have a copay. He states that he would prefer to make his follow up appointment with Dr Dewey to check his INR on [facility] himself. Denied any other CM assistance.   Action/Plan:  No CM needs identified.   Expected Discharge Date:                  Expected Discharge Plan:  Home/Self Care  In-House Referral:     Discharge planning Services  CM Consult  Post Acute Care Choice:    Choice offered to:     DME Arranged:    DME Agency:     HH Arranged:    HH Agency:     Status of Service:  Completed, signed off  Medicare Important Message Given:    Date Medicare IM Given:    Medicare IM give by:    Date Additional Medicare IM Given:    Additional Medicare Important Message give by:     If discussed at Long Length of Stay Meetings, dates discussed:    Additional Comments:  Debbie Swist, RN 07/30/2015, 11:27 AM  

## 2015-07-30 NOTE — Progress Notes (Signed)
VASCULAR LAB PRELIMINARY  PRELIMINARY  PRELIMINARY  PRELIMINARY  Bilateral lower extremity venous duplex completed.    Preliminary report:  There is acute DVT noted in the right soleal vein.  All other veins appear thrombus free.   Jnae Thomaston, RVT 07/30/2015, 10:40 AM

## 2015-07-30 NOTE — Discharge Summary (Addendum)
Physician Discharge Summary  Michael [name] MRN: [id number] DOB/AGE: 61-Apr-1957 61 y.o.  PCP: Rachell Cipro, MD   Admit date: 07/28/2015 Discharge date: 07/30/2015  Discharge Diagnoses:   Principal Problem:   Acute pulmonary embolism [facility]) Active Problems:   HIV DISEASE   Essential hypertension   Hyperlipidemia    Follow-up recommendations Follow-up with PCP in 3-5 days , including all  additional recommended appointments as below Follow-up CBC, CMP in 3-5 days Check INR on 10/26, 10/28, 10/31 stop Lovenox if INR greater than 2 for 2 consecutive days      Medication List    STOP taking these medications        ibuprofen 200 MG tablet  Commonly known as:  ADVIL,MOTRIN      TAKE these medications        acetaminophen 500 MG tablet  Commonly known as:  TYLENOL  Take 1,000 mg by mouth every 6 (six) hours as needed for headache.     ALPRAZOLAM XR 1 MG 24 hr tablet  Generic drug:  ALPRAZolam  Take 3 mg by mouth daily.     atorvastatin 10 MG tablet  Commonly known as:  LIPITOR  Take 10 mg by mouth at bedtime.     BECONASE AQ 42 MCG/SPRAY nasal spray  Generic drug:  beclomethasone  Place 2 sprays into both nostrils daily as needed (seasonal allergies (fall season)). Dose is for each nostril.     bifidobacterium infantis capsule  Take 1 capsule by mouth daily.     DESCOVY 200-25 MG tablet  Generic drug:  emtricitabine-tenofovir AF  Take 1 tablet by mouth daily.     enalapril-hydrochlorothiazide 10-25 MG tablet  Commonly known as:  VASERETIC  Take 1 tablet by mouth daily.     enoxaparin 120 MG/0.8ML injection  Commonly known as:  LOVENOX  Inject 0.8 mLs (120 mg total) into the skin daily.  Start taking on:  07/31/2015     esomeprazole 20 MG capsule  Commonly known as:  NEXIUM  Take 20 mg by mouth daily.     ketotifen 0.025 % ophthalmic solution  Commonly known as:  ZADITOR  Place 1 drop into both eyes daily as needed (itching).     montelukast 10  MG tablet  Commonly known as:  SINGULAIR  Take 10 mg by mouth at bedtime. Once tablet at bedtime     nitroGLYCERIN 0.2 mg/hr patch  Commonly known as:  NITRODUR - Dosed in mg/24 hr  Apply 1/4 patch to affected area daily.  Change patch every 24 hours.     omega-3 acid ethyl esters 1 G capsule  Commonly known as:  LOVAZA  Take 1 g by mouth 2 (two) times daily.     oxyCODONE 5 MG immediate release tablet  Commonly known as:  Oxy IR/ROXICODONE  Take 1 tablet (5 mg total) by mouth every 6 (six) hours as needed for moderate pain.     PREZCOBIX 800-150 MG tablet  Generic drug:  darunavir-cobicistat  Take 1 tablet by mouth daily. Swallow whole. Do NOT crush, break or chew tablets. Take with food.     TIVICAY 50 MG tablet  Generic drug:  dolutegravir  Take 50 mg by mouth daily.     valACYclovir 500 MG tablet  Commonly known as:  VALTREX  Take 500 mg by mouth daily.     warfarin 7.5 MG tablet  Commonly known as:  COUMADIN  Take 1 tablet (7.5 mg total) by mouth one time only at 6  PM.         Discharge Condition: stable    Disposition: 01-Home or Self Care   Consults: PCCM    Significant Diagnostic Studies:  No results found.      Filed Weights   07/28/15 2148  Weight: 77.747 kg (171 lb 6.4 oz)     Microbiology: No results found for this or any previous visit (from the past 240 hour(s)).     Blood Culture No results found for: SDES, [place], CULT, REPTSTATUS    Labs: Results for orders placed or performed during the hospital encounter of 07/28/15 (from the past 48 hour(s))  CBC     Status: Abnormal   Collection Time: 07/29/15  5:20 AM  Result Value Ref Range   WBC 9.2 4.0 - 10.5 K/uL   RBC 4.62 4.22 - 5.81 MIL/uL   Hemoglobin 16.1 13.0 - 17.0 g/dL   HCT 44.6 39.0 - 52.0 %   MCV 96.5 78.0 - 100.0 fL   MCH 34.8 (H) 26.0 - 34.0 pg   MCHC 36.1 (H) 30.0 - 36.0 g/dL   RDW 12.8 11.5 - 15.5 %   Platelets 107 (L) 150 - 400 K/uL    Comment: PLATELET  COUNT CONFIRMED BY SMEAR SPECIMEN CHECKED FOR CLOTS REPEATED TO VERIFY   Basic metabolic panel     Status: Abnormal   Collection Time: 07/29/15  5:20 AM  Result Value Ref Range   Sodium 137 135 - 145 mmol/L   Potassium 3.7 3.5 - 5.1 mmol/L   Chloride 100 (L) 101 - 111 mmol/L   CO2 27 22 - 32 mmol/L   Glucose, Bld 110 (H) 65 - 99 mg/dL   BUN 17 6 - 20 mg/dL   Creatinine, Ser 1.31 (H) 0.61 - 1.24 mg/dL   Calcium 9.2 8.9 - 10.3 mg/dL   GFR calc non Af Amer 57 (L) >60 mL/min   GFR calc Af Amer >60 >60 mL/min    Comment: (NOTE) The eGFR has been calculated using the CKD EPI equation. This calculation has not been validated in all clinical situations. eGFR's persistently <60 mL/min signify possible Chronic Kidney Disease.    Anion gap 10 5 - 15  Protime-INR     Status: None   Collection Time: 07/29/15  5:20 AM  Result Value Ref Range   Prothrombin Time 14.7 11.6 - 15.2 seconds   INR 1.13 0.00 - 1.49  Brain natriuretic peptide     Status: Abnormal   Collection Time: 07/29/15 10:56 AM  Result Value Ref Range   B Natriuretic Peptide 313.6 (H) 0.0 - 100.0 pg/mL  Antithrombin III     Status: None   Collection Time: 07/29/15 10:56 AM  Result Value Ref Range   AntiThromb III Func 100 75 - 120 %  Troponin I     Status: Abnormal   Collection Time: 07/29/15  4:50 PM  Result Value Ref Range   Troponin I 0.07 (H) <0.031 ng/mL    Comment:        PERSISTENTLY INCREASED TROPONIN VALUES IN THE RANGE OF 0.04-0.49 ng/mL CAN BE SEEN IN:       -UNSTABLE ANGINA       -CONGESTIVE HEART FAILURE       -MYOCARDITIS       -CHEST TRAUMA       -ARRYHTHMIAS       -LATE PRESENTING MYOCARDIAL INFARCTION       -COPD   CLINICAL FOLLOW-UP RECOMMENDED.   Protime-INR  Status: None   Collection Time: 07/30/15  5:28 AM  Result Value Ref Range   Prothrombin Time 14.7 11.6 - 15.2 seconds   INR 1.13 0.00 - 1.49     Lipid Panel     Component Value Date/Time   CHOL 207* 08/17/2007 1942   TRIG  583* 08/17/2007 1942   HDL 39* 08/17/2007 1942   CHOLHDL 5.3 Ratio 08/17/2007 1942   VLDL NOT CALC mg/dL 08/17/2007 1942   [place] See Comment mg/dL 08/17/2007 1942     No results found for: HGBA1C   Lab Results  Component Value Date   LDLCALC See Comment mg/dL 08/17/2007   CREATININE 1.31* 07/29/2015     HPI :59 Y/O with HTN, hyperlipidemia, HIV PE in past presents with dyspnea, for 3 days. He had a cast on Rt LE last week for plantar fascitis. He had cramping pain in leg after that. Also had dyspnea after his exercise session last week. He took a road trip to [facility] yesterday and was was more dyspneic after that. He went to the ED in [place] and was diagnosed with PE with RV strain. He was then transferred to [facility] for management.  He has history of PE in 2015 after a shoulder surgery. He was treated with 6 months of anticoagulation at that time. Echo at time did not show any RV strain or pulmonary hypertension.   HOSPITAL COURSE:  Acute Saddle pulmonary embolism ([place]) - With a history of PE she was previously on Eliquis,it is his second PE since 2015   CT Angio showed saddle pulmonary embolism. acute DVT noted in the right soleal vein and 2-D pending at this time . - Patient has agreed to go on Coumadin.  discussed with pharmacy and ID pt has  contraindication on using new oral anticoagulants.(NOACs are contraindicated due to increased risk of bleeding (strong CYP3A4, Pgp inhibitor) Started on lovenox on 10/22, started coumadin on 10/24 Needs INR check on INR 10/28, 10/29 He is on anticoagulation for now and may need life long therapy.   HIV DISEASE Continue HIV medications.  Essential hypertension - Continue current home regimen.  Hyperlipidemia No change cont statins   DVT Prophylaxis - Lovenox ordered.    Discharge Exam:   Blood pressure 120/76, pulse 64, temperature 97.5 F (36.4 C), temperature source Oral, resp. rate 16, height $RemoveBe'5\' 5"'[id number]$  (1.651 m), weight  77.747 kg (171 lb 6.4 oz), SpO2 98 %.  General: Awake, Oriented, No distress Neuro: No focal deficits HEENT: PERRL, Moist mucus membranes Cardiovascular: S1, S2 heard. No MRG Lungs: Clear to auscultation, no wheeze or gallops Abdomen: Soft, + BS Skin: Intact       Discharge Instructions    Diet - low sodium heart healthy    Complete by:  As directed      Increase activity slowly    Complete by:  As directed            Follow-up Information    Follow up with [facility], MD. Schedule an appointment as soon as possible for a visit on 08/03/2015.   Specialty:  Family Medicine   Why:  Patient would need an INR check on 10/28, and 10/29   Contact information:   [address] [place] [address] [phone]       Signed: Reyne Dumas 07/30/2015, 12:10 PM        Time spent >45 mins

## 2015-07-30 NOTE — Progress Notes (Signed)
Patiet discharged to home. Vitals stable for pt. IV and Telemetry dc'd. Discharge instructions and education given to pt and all questions addressed. 07/30/2015 3:36 PM Aliea Bobe

## 2015-07-31 ENCOUNTER — Ambulatory Visit: Payer: Managed Care, Other (non HMO) | Admitting: Pulmonary Disease

## 2015-07-31 LAB — PROTEIN S ACTIVITY: PROTEIN S ACTIVITY: 98 % (ref 63–140)

## 2015-08-01 LAB — APC RESISTANCE: ACTIVATED PROT C RESIST: 4.7 ratio (ref >=2.1)

## 2015-08-03 ENCOUNTER — Ambulatory Visit (INDEPENDENT_AMBULATORY_CARE_PROVIDER_SITE_OTHER): Payer: Managed Care, Other (non HMO) | Admitting: Pharmacist

## 2015-08-03 DIAGNOSIS — I2699 Other pulmonary embolism without acute cor pulmonale: Secondary | ICD-10-CM

## 2015-08-03 DIAGNOSIS — Z5181 Encounter for therapeutic drug level monitoring: Secondary | ICD-10-CM | POA: Diagnosis not present

## 2015-08-03 LAB — PROTHROMBIN GENE MUTATION

## 2015-08-03 LAB — FACTOR 5 LEIDEN

## 2015-08-03 LAB — POCT INR: INR: 1.8

## 2015-08-06 ENCOUNTER — Ambulatory Visit (INDEPENDENT_AMBULATORY_CARE_PROVIDER_SITE_OTHER): Payer: Managed Care, Other (non HMO) | Admitting: Pharmacist

## 2015-08-06 DIAGNOSIS — Z5181 Encounter for therapeutic drug level monitoring: Secondary | ICD-10-CM

## 2015-08-06 DIAGNOSIS — I2699 Other pulmonary embolism without acute cor pulmonale: Secondary | ICD-10-CM

## 2015-08-06 LAB — POCT INR: INR: 2

## 2015-08-13 ENCOUNTER — Ambulatory Visit (INDEPENDENT_AMBULATORY_CARE_PROVIDER_SITE_OTHER): Payer: Managed Care, Other (non HMO) | Admitting: Pharmacist

## 2015-08-13 DIAGNOSIS — Z5181 Encounter for therapeutic drug level monitoring: Secondary | ICD-10-CM | POA: Diagnosis not present

## 2015-08-13 DIAGNOSIS — I2699 Other pulmonary embolism without acute cor pulmonale: Secondary | ICD-10-CM

## 2015-08-13 LAB — POCT INR: INR: 2.2

## 2015-08-20 ENCOUNTER — Encounter: Payer: Self-pay | Admitting: Adult Health

## 2015-08-20 ENCOUNTER — Ambulatory Visit (INDEPENDENT_AMBULATORY_CARE_PROVIDER_SITE_OTHER): Payer: Managed Care, Other (non HMO) | Admitting: Adult Health

## 2015-08-20 VITALS — BP 124/82 | HR 67 | Temp 98.1°F | Ht 65.0 in | Wt 173.0 lb

## 2015-08-20 DIAGNOSIS — I2699 Other pulmonary embolism without acute cor pulmonale: Secondary | ICD-10-CM

## 2015-08-20 NOTE — Patient Instructions (Signed)
Continue on Coumadin .  Follow up with Coumadin Clinic  Report any signs of bleeding .  Follow up with Dr. Chase Caller in January as planned and As needed   May return to work on 09/17/15

## 2015-08-20 NOTE — Assessment & Plan Note (Signed)
Acute PE and DVT on right -considered provoked but this is his second clot.  Hypercoagulable panel essentially neg on repeat testing with Dr. Beryle Beams off coumadin  Will discuss this further going forward and may need to get Hematology input regarding length of treatment  For now plans to treat for at least 1year and consider life long as this is his second clot.  Will discuss further with Dr. Chase Caller on return in January.

## 2015-08-20 NOTE — Progress Notes (Signed)
Subjective:    Patient ID: Michael [name], male    DOB: March 22, 1956, 59 y.o.   MRN: [id number]  HPI  59 year old male with known history of hypertension, HIV and previous pulmonary emboli. Patient was recently admitted October 22 and found to have an acute PE.  08/20/2015 post hospital follow-up Presents for a post hospital follow-up Patient was recently admitted for acute PE and DVT Patient had recently had a road trip to [place] and has been in a cast for plantar fasciitis. He presented with shortness of breath. CT chest showed a saddle pulmonary embolism Venous Doppler was positive for a right DVT 2-D echo showed an EF of 123456, grade 1 diastolic dysfunction Factor V Leiden, prothrombin gene mutation,  was negative. Anti-thrombin 3 and protein S were normal. Patient had a previous PE in 2015 after shoulder surgery Patient was started on Lovenox and Coumadin INR on November 7 Was 2.2.  We discussed this is his second clot. There is discussion that he may need  Lifelong even though they were both considered provoked.   Previously seen by Hematology in 11/2014  With Dr. Beryle Beams.  Repeat lupus anticoagulant was neg.   Still gets winded and worn out with light activity. No chest pain or syncope.  No bleeding . Following with coumadin clinic.  Plans to return to work on 09/17/15 .       Past Medical History  Diagnosis Date  . HTN (hypertension)   . Hyperlipidemia   . HIV (human immunodeficiency virus infection) ([place])   . GERD (gastroesophageal reflux disease)    Current Outpatient Prescriptions on File Prior to Visit  Medication Sig Dispense Refill  . acetaminophen (TYLENOL) 500 MG tablet Take 1,000 mg by mouth every 6 (six) hours as needed for headache.    . ALPRAZOLAM XR 1 MG 24 hr tablet Take 3 mg by mouth daily.     Marland Kitchen atorvastatin (LIPITOR) 10 MG tablet Take 10 mg by mouth at bedtime.     . bifidobacterium infantis (ALIGN) capsule Take 1 capsule by mouth daily.      . darunavir-cobicistat (PREZCOBIX) 800-150 MG per tablet Take 1 tablet by mouth daily. Swallow whole. Do NOT crush, break or chew tablets. Take with food.    . dolutegravir (TIVICAY) 50 MG tablet Take 50 mg by mouth daily.    Marland Kitchen emtricitabine-tenofovir AF (DESCOVY) 200-25 MG tablet Take 1 tablet by mouth daily.    . enalapril-hydrochlorothiazide (VASERETIC) 10-25 MG per tablet Take 1 tablet by mouth daily.    Marland Kitchen esomeprazole (NEXIUM) 20 MG capsule Take 20 mg by mouth daily.     Marland Kitchen ketotifen (ZADITOR) 0.025 % ophthalmic solution Place 1 drop into both eyes daily as needed (itching).    . montelukast (SINGULAIR) 10 MG tablet Take 10 mg by mouth at bedtime. Once tablet at bedtime    . omega-3 acid ethyl esters (LOVAZA) 1 G capsule Take 1 g by mouth 2 (two) times daily.    . valACYclovir (VALTREX) 500 MG tablet Take 500 mg by mouth daily.     Marland Kitchen warfarin (COUMADIN) 7.5 MG tablet Take 1 tablet (7.5 mg total) by mouth one time only at 6 PM. (Patient taking differently: Take 7.5 mg by mouth one time only at 6 PM. On Mondays only take 1 1/2 tablet) 60 tablet 0  . beclomethasone (BECONASE AQ) 42 MCG/SPRAY nasal spray Place 2 sprays into both nostrils daily as needed (seasonal allergies (fall season)). Dose is for each nostril.    Marland Kitchen  enoxaparin (LOVENOX) 120 MG/0.8ML injection Inject 0.8 mLs (120 mg total) into the skin daily. (Patient not taking: Reported on 08/20/2015) 8 Syringe 0   No current facility-administered medications on file prior to visit.     Review of Systems Constitutional:   No  weight loss, night sweats,  Fevers, chills, + fatigue, or  lassitude.  HEENT:   No headaches,  Difficulty swallowing,  Tooth/dental problems, or  Sore throat,                No sneezing, itching, ear ache, nasal congestion, post nasal drip,   CV:  No chest pain,  Orthopnea, PND, swelling in lower extremities, anasarca, dizziness, palpitations, syncope.   GI  No heartburn, indigestion, abdominal pain, nausea,  vomiting, diarrhea, change in bowel habits, loss of appetite, bloody stools.   Resp: No shortness of breath with exertion or at rest.  No excess mucus, no productive cough,  No non-productive cough,  No coughing up of blood.  No change in color of mucus.  No wheezing.  No chest wall deformity  Skin: no rash or lesions.  GU: no dysuria, change in color of urine, no urgency or frequency.  No flank pain, no hematuria   MS:  No joint pain or swelling.  No decreased range of motion.  No back pain.  Psych:  No change in mood or affect. No depression or anxiety.  No memory loss.         Objective:   Physical Exam  GEN: A/Ox3; pleasant , NAD, well nourished  VS reviewed   HEENT:  [place]/AT,  EACs-clear, TMs-wnl, NOSE-clear, THROAT-clear, no lesions, no postnasal drip or exudate noted.   NECK:  Supple w/ fair ROM; no JVD; normal carotid impulses w/o bruits; no thyromegaly or nodules palpated; no lymphadenopathy.  RESP  Clear  P & A; w/o, wheezes/ rales/ or rhonchi.no accessory muscle use, no dullness to percussion  CARD:  RRR, no m/r/g  , no peripheral edema, pulses intact, no cyanosis or clubbing.  GI:   Soft & nt; nml bowel sounds; no organomegaly or masses detected.  Musco: Warm bil, no deformities or joint swelling noted.   Neuro: alert, no focal deficits noted.    Skin: Warm, no lesions or rashes        Assessment & Plan:

## 2015-08-27 ENCOUNTER — Ambulatory Visit (INDEPENDENT_AMBULATORY_CARE_PROVIDER_SITE_OTHER): Payer: Managed Care, Other (non HMO)

## 2015-08-27 DIAGNOSIS — Z5181 Encounter for therapeutic drug level monitoring: Secondary | ICD-10-CM

## 2015-08-27 DIAGNOSIS — I2699 Other pulmonary embolism without acute cor pulmonale: Secondary | ICD-10-CM

## 2015-08-27 LAB — POCT INR: INR: 2.2

## 2015-08-27 MED ORDER — WARFARIN SODIUM 7.5 MG PO TABS: ORAL_TABLET | ORAL | Status: DC

## 2015-09-17 ENCOUNTER — Ambulatory Visit (INDEPENDENT_AMBULATORY_CARE_PROVIDER_SITE_OTHER): Payer: Managed Care, Other (non HMO) | Admitting: Pharmacist

## 2015-09-17 DIAGNOSIS — I2699 Other pulmonary embolism without acute cor pulmonale: Secondary | ICD-10-CM

## 2015-09-17 DIAGNOSIS — Z5181 Encounter for therapeutic drug level monitoring: Secondary | ICD-10-CM

## 2015-09-17 LAB — POCT INR: INR: 1.7

## 2015-09-29 ENCOUNTER — Ambulatory Visit (INDEPENDENT_AMBULATORY_CARE_PROVIDER_SITE_OTHER): Payer: Managed Care, Other (non HMO) | Admitting: Family Medicine

## 2015-09-29 VITALS — BP 124/68 | HR 77 | Temp 98.0°F | Resp 14 | Ht 65.25 in | Wt 177.2 lb

## 2015-09-29 DIAGNOSIS — J0100 Acute maxillary sinusitis, unspecified: Secondary | ICD-10-CM | POA: Diagnosis not present

## 2015-09-29 DIAGNOSIS — R05 Cough: Secondary | ICD-10-CM | POA: Diagnosis not present

## 2015-09-29 DIAGNOSIS — R059 Cough, unspecified: Secondary | ICD-10-CM

## 2015-09-29 MED ORDER — HYDROCOD POLST-CPM POLST ER 10-8 MG/5ML PO SUER: 5.0000 mL | Freq: Every evening | ORAL | Status: DC | PRN

## 2015-09-29 MED ORDER — AMOXICILLIN-POT CLAVULANATE 875-125 MG PO TABS: 1.0000 | ORAL_TABLET | Freq: Two times a day (BID) | ORAL | Status: DC

## 2015-09-29 NOTE — Patient Instructions (Signed)

## 2015-09-29 NOTE — Progress Notes (Signed)
This chart was scribed for Robyn Haber, MD by [facility] medical scribe at [facility].The patient was seen in exam room 13 and the patient's care was started at 8:46 AM.  Patient ID: Michael [name] MRN: [id number], DOB: Dec 30, 1955, 59 y.o. Date of Encounter: 09/29/2015  Primary Physician: Rachell Cipro, MD  Chief Complaint:  Chief Complaint  Patient presents with  . Nasal Congestion  . chest congestion  . Cough    productive, yellow   HPI:  Michael [name] is a 59 y.o. male who presents to [facility] with nasal congestion, chest congestion and a productive cough for the past five days. Symptoms have gradually worsened since Monday. His cough is producing a yellow phlegm and worse at night, so he has been unable to sleep. Associated epistaxis.  Past Medical History  Diagnosis Date  . HTN (hypertension)   . Hyperlipidemia   . HIV (human immunodeficiency virus infection) ([place])   . GERD (gastroesophageal reflux disease)     Home Meds: Prior to Admission medications   Medication Sig Start Date End Date Taking? Authorizing Provider  ALPRAZOLAM XR 1 MG 24 hr tablet Take 3 mg by mouth daily.  01/19/15  Yes Historical Provider, MD  atorvastatin (LIPITOR) 10 MG tablet Take 10 mg by mouth at bedtime.    Yes Historical Provider, MD  darunavir-cobicistat (PREZCOBIX) 800-150 MG per tablet Take 1 tablet by mouth daily. Swallow whole. Do NOT crush, break or chew tablets. Take with food.   Yes Historical Provider, MD  dolutegravir (TIVICAY) 50 MG tablet Take 50 mg by mouth daily.   Yes Historical Provider, MD  emtricitabine-tenofovir AF (DESCOVY) 200-25 MG tablet Take 1 tablet by mouth daily.   Yes Historical Provider, MD  enalapril-hydrochlorothiazide (VASERETIC) 10-25 MG per tablet Take 1 tablet by mouth daily. 12/09/12  Yes Historical Provider, MD  esomeprazole (NEXIUM) 20 MG capsule Take 20 mg by mouth daily.  12/07/14  Yes Historical Provider, MD    omega-3 acid ethyl esters (LOVAZA) 1 G capsule Take 1 g by mouth 2 (two) times daily.   Yes Historical Provider, MD  valACYclovir (VALTREX) 500 MG tablet Take 500 mg by mouth daily.  01/28/13  Yes Historical Provider, MD  warfarin (COUMADIN) 7.5 MG tablet Take as directed by [facility] 08/27/15  Yes Brand Males, MD  acetaminophen (TYLENOL) 500 MG tablet Take 1,000 mg by mouth every 6 (six) hours as needed for headache. Reported on 09/29/2015    Historical Provider, MD  beclomethasone (BECONASE AQ) 42 MCG/SPRAY nasal spray Place 2 sprays into both nostrils daily as needed (seasonal allergies (fall season)). Reported on 09/29/2015    Historical Provider, MD  bifidobacterium infantis (ALIGN) capsule Take 1 capsule by mouth daily. Reported on 09/29/2015    Historical Provider, MD  enoxaparin (LOVENOX) 120 MG/0.8ML injection Inject 0.8 mLs (120 mg total) into the skin daily. Patient not taking: Reported on 08/20/2015 07/31/15   Reyne Dumas, MD  ketotifen (ZADITOR) 0.025 % ophthalmic solution Place 1 drop into both eyes daily as needed (itching). Reported on 09/29/2015    Historical Provider, MD  montelukast (SINGULAIR) 10 MG tablet Take 10 mg by mouth at bedtime. Reported on 09/29/2015 08/17/14   Historical Provider, MD   Allergies:  Allergies  Allergen Reactions  . Efavirenz Rash   Social History   Social History  . Marital Status: Married    Spouse Name: N/A  . Number of Children: N/A  . Years  of Education: N/A   Occupational History  . Clinical research [employer]   Social History Main Topics  . Smoking status: Never Smoker   . Smokeless tobacco: Never Used  . Alcohol Use: 0.0 oz/week    0 Standard drinks or equivalent per week     Comment: rarely.  . Drug Use: No  . Sexual Activity: Not on file   Other Topics Concern  . Not on file   Social History Narrative    Review of Systems: Constitutional: negative for chills, fever, night sweats, weight changes, or fatigue   HEENT: negative for vision changes, hearing loss,rhinorrhea, ST or sinus pressure. Positive for chest congestion, nasal congestion, and epistaxis  Cardiovascular: negative for chest pain or palpitations Respiratory: negative for hemoptysis, wheezing, shortness of breath. Positive for cough. Abdominal: negative for abdominal pain, nausea, vomiting, diarrhea, or constipation Dermatological: negative for rash Neurologic: negative for headache, dizziness, or syncope All other systems reviewed and are otherwise negative with the exception to those above and in the HPI.  Physical Exam: Blood pressure 124/68, pulse 77, temperature 98 F (36.7 C), temperature source Oral, resp. rate 14, height 5' 5.25" (1.657 m), weight 177 lb 3.2 oz (80.377 kg), SpO2 97 %., Body mass index is 29.27 kg/(m^2). General: Well developed, well nourished, in no acute distress. Head: Normocephalic, atraumatic, eyes without discharge, sclera non-icteric, nares are without discharge. Bilateral auditory canals clear, TM's are without perforation, pearly grey and translucent with reflective cone of light bilaterally. Oral cavity moist, posterior pharynx without exudate, erythema, peritonsillar abscess, or post nasal drip. Mucosal  purulent discharge, post nasal drainage.  Neck: Supple. No thyromegaly. Full ROM. No lymphadenopathy. Lungs: Clear bilaterally to auscultation without wheezes, rales, or rhonchi. Breathing is unlabored. Heart: RRR with S1 S2. No murmurs, rubs, or gallops appreciated. Abdomen: Soft, non-tender, non-distended with normoactive bowel sounds. No hepatomegaly. No rebound/guarding. No obvious abdominal masses. Msk:  Strength and tone normal for age. Extremities/Skin: Warm and dry. No clubbing or cyanosis. No edema. No rashes or suspicious lesions. Neuro: Alert and oriented X 3. Moves all extremities spontaneously. Gait is normal. CNII-XII grossly in tact. Psych:  Responds to questions appropriately with a  normal affect.   ASSESSMENT AND PLAN:  59 y.o. year old male with   By signing my name below, I, Nadim Abuhashem, attest that this documentation has been prepared under the direction and in the presence of Robyn Haber, MD.  Electronically Signed: Lora Havens, medical scribe. 09/29/2015 9:04 AM.  This chart was scribed in my presence and reviewed by me personally.    ICD-9-CM ICD-10-CM   1. Acute maxillary sinusitis, recurrence not specified 461.0 J01.00 amoxicillin-clavulanate (AUGMENTIN) 875-125 MG tablet  2. Cough 786.2 R05 chlorpheniramine-HYDROcodone (TUSSIONEX PENNKINETIC ER) 10-8 MG/5ML SUER     Signed, Robyn Haber, MD

## 2015-10-01 ENCOUNTER — Telehealth: Payer: Self-pay | Admitting: Pulmonary Disease

## 2015-10-01 ENCOUNTER — Emergency Department (HOSPITAL_BASED_OUTPATIENT_CLINIC_OR_DEPARTMENT_OTHER): Payer: Managed Care, Other (non HMO)

## 2015-10-01 ENCOUNTER — Encounter (HOSPITAL_COMMUNITY): Payer: Self-pay

## 2015-10-01 ENCOUNTER — Observation Stay (HOSPITAL_COMMUNITY)
Admission: EM | Admit: 2015-10-01 | Discharge: 2015-10-02 | Disposition: A | Discharge status: Home / Self Care | Payer: Managed Care, Other (non HMO) | Attending: Internal Medicine | Admitting: Internal Medicine

## 2015-10-01 DIAGNOSIS — M7989 Other specified soft tissue disorders: Secondary | ICD-10-CM | POA: Diagnosis not present

## 2015-10-01 DIAGNOSIS — Z8619 Personal history of other infectious and parasitic diseases: Secondary | ICD-10-CM | POA: Insufficient documentation

## 2015-10-01 DIAGNOSIS — Z7901 Long term (current) use of anticoagulants: Secondary | ICD-10-CM | POA: Diagnosis not present

## 2015-10-01 DIAGNOSIS — M722 Plantar fascial fibromatosis: Secondary | ICD-10-CM | POA: Insufficient documentation

## 2015-10-01 DIAGNOSIS — B20 Human immunodeficiency virus [HIV] disease: Secondary | ICD-10-CM | POA: Diagnosis present

## 2015-10-01 DIAGNOSIS — I82491 Acute embolism and thrombosis of other specified deep vein of right lower extremity: Secondary | ICD-10-CM | POA: Diagnosis not present

## 2015-10-01 DIAGNOSIS — E876 Hypokalemia: Secondary | ICD-10-CM

## 2015-10-01 DIAGNOSIS — I82411 Acute embolism and thrombosis of right femoral vein: Principal | ICD-10-CM | POA: Insufficient documentation

## 2015-10-01 DIAGNOSIS — Z86711 Personal history of pulmonary embolism: Secondary | ICD-10-CM | POA: Diagnosis not present

## 2015-10-01 DIAGNOSIS — K219 Gastro-esophageal reflux disease without esophagitis: Secondary | ICD-10-CM | POA: Diagnosis present

## 2015-10-01 DIAGNOSIS — Z96611 Presence of right artificial shoulder joint: Secondary | ICD-10-CM | POA: Insufficient documentation

## 2015-10-01 DIAGNOSIS — I82409 Acute embolism and thrombosis of unspecified deep veins of unspecified lower extremity: Secondary | ICD-10-CM | POA: Diagnosis present

## 2015-10-01 DIAGNOSIS — I82401 Acute embolism and thrombosis of unspecified deep veins of right lower extremity: Secondary | ICD-10-CM | POA: Diagnosis present

## 2015-10-01 DIAGNOSIS — Z79899 Other long term (current) drug therapy: Secondary | ICD-10-CM | POA: Insufficient documentation

## 2015-10-01 DIAGNOSIS — Z8249 Family history of ischemic heart disease and other diseases of the circulatory system: Secondary | ICD-10-CM | POA: Diagnosis not present

## 2015-10-01 DIAGNOSIS — I10 Essential (primary) hypertension: Secondary | ICD-10-CM | POA: Diagnosis not present

## 2015-10-01 DIAGNOSIS — J329 Chronic sinusitis, unspecified: Secondary | ICD-10-CM | POA: Diagnosis not present

## 2015-10-01 DIAGNOSIS — E785 Hyperlipidemia, unspecified: Secondary | ICD-10-CM | POA: Diagnosis not present

## 2015-10-01 DIAGNOSIS — O2230 Deep phlebothrombosis in pregnancy, unspecified trimester: Secondary | ICD-10-CM

## 2015-10-01 DIAGNOSIS — F419 Anxiety disorder, unspecified: Secondary | ICD-10-CM | POA: Diagnosis not present

## 2015-10-01 HISTORY — DX: Anxiety disorder, unspecified: F41.9

## 2015-10-01 HISTORY — DX: Unspecified viral hepatitis B without hepatic coma: B19.10

## 2015-10-01 HISTORY — DX: Acute embolism and thrombosis of unspecified deep veins of unspecified lower extremity: I82.409

## 2015-10-01 HISTORY — DX: Other pulmonary embolism without acute cor pulmonale: I26.99

## 2015-10-01 HISTORY — DX: Unspecified asthma, uncomplicated: J45.909

## 2015-10-01 HISTORY — DX: Pneumonia, unspecified organism: J18.9

## 2015-10-01 HISTORY — DX: Saddle embolus of pulmonary artery without acute cor pulmonale: I26.92

## 2015-10-01 LAB — CBC WITH DIFFERENTIAL/PLATELET
Basophils Absolute: 0 10*3/uL (ref 0.0–0.1)
Basophils Relative: 0 %
Eosinophils Absolute: 0.2 10*3/uL (ref 0.0–0.7)
Eosinophils Relative: 2 %
HCT: 42.3 % (ref 39.0–52.0)
Hemoglobin: 15.2 g/dL (ref 13.0–17.0)
Lymphocytes Relative: 37 %
Lymphs Abs: 3.3 10*3/uL (ref 0.7–4.0)
MCH: 34.5 pg — ABNORMAL HIGH (ref 26.0–34.0)
MCHC: 35.9 g/dL (ref 30.0–36.0)
MCV: 96.1 fL (ref 78.0–100.0)
Monocytes Absolute: 0.6 10*3/uL (ref 0.1–1.0)
Monocytes Relative: 6 %
Neutro Abs: 5.1 10*3/uL (ref 1.7–7.7)
Neutrophils Relative %: 55 %
Platelets: 138 10*3/uL — ABNORMAL LOW (ref 150–400)
RBC: 4.4 MIL/uL (ref 4.22–5.81)
RDW: 12.7 % (ref 11.5–15.5)
WBC: 9.2 10*3/uL (ref 4.0–10.5)

## 2015-10-01 LAB — BASIC METABOLIC PANEL
ANION GAP: 12 (ref 5–15)
BUN: 11 mg/dL (ref 6–20)
CO2: 26 mmol/L (ref 22–32)
Calcium: 9.8 mg/dL (ref 8.9–10.3)
Chloride: 101 mmol/L (ref 101–111)
Creatinine, Ser: 1.08 mg/dL (ref 0.61–1.24)
Glucose, Bld: 100 mg/dL — ABNORMAL HIGH (ref 65–99)
POTASSIUM: 3.2 mmol/L — AB (ref 3.5–5.1)
SODIUM: 139 mmol/L (ref 135–145)

## 2015-10-01 LAB — TROPONIN I: Troponin I: <0.03 ng/mL (ref <0.031)

## 2015-10-01 LAB — PROTIME-INR
INR: 2.22 — AB (ref 0.00–1.49)
PROTHROMBIN TIME: 24.4 s — AB (ref 11.6–15.2)

## 2015-10-01 MED ORDER — VALACYCLOVIR HCL 500 MG PO TABS
500.0000 mg | ORAL_TABLET | Freq: Every day | ORAL | Status: DC
  Administered 2015-10-02: 500 mg via ORAL
  Filled 2015-10-01 (×2): qty 1

## 2015-10-01 MED ORDER — ALPRAZOLAM 0.5 MG PO TABS: 1.0000 mg | ORAL_TABLET | Freq: Three times a day (TID) | ORAL | Status: DC | PRN

## 2015-10-01 MED ORDER — HYDROCHLOROTHIAZIDE 25 MG PO TABS
25.0000 mg | ORAL_TABLET | Freq: Every day | ORAL | Status: DC
  Administered 2015-10-02: 25 mg via ORAL
  Filled 2015-10-01 (×2): qty 1

## 2015-10-01 MED ORDER — ONDANSETRON HCL 4 MG/2ML IJ SOLN: 4.0000 mg | Freq: Four times a day (QID) | INTRAMUSCULAR | Status: DC | PRN

## 2015-10-01 MED ORDER — AMOXICILLIN-POT CLAVULANATE 875-125 MG PO TABS
1.0000 | ORAL_TABLET | Freq: Two times a day (BID) | ORAL | Status: DC
  Administered 2015-10-01 – 2015-10-02 (×2): 1 via ORAL
  Filled 2015-10-01 (×3): qty 1

## 2015-10-01 MED ORDER — ONDANSETRON HCL 4 MG PO TABS: 4.0000 mg | ORAL_TABLET | Freq: Four times a day (QID) | ORAL | Status: DC | PRN

## 2015-10-01 MED ORDER — DARUNAVIR-COBICISTAT 800-150 MG PO TABS
1.0000 | ORAL_TABLET | Freq: Every day | ORAL | Status: DC
  Administered 2015-10-02: 1 via ORAL
  Filled 2015-10-01 (×2): qty 1

## 2015-10-01 MED ORDER — ENOXAPARIN SODIUM 80 MG/0.8ML ~~LOC~~ SOLN: 1.0000 mg/kg | Freq: Once | SUBCUTANEOUS | Status: DC

## 2015-10-01 MED ORDER — OMEGA-3-ACID ETHYL ESTERS 1 G PO CAPS
1.0000 g | ORAL_CAPSULE | Freq: Two times a day (BID) | ORAL | Status: DC
  Administered 2015-10-01 – 2015-10-02 (×2): 1 g via ORAL
  Filled 2015-10-01 (×3): qty 1

## 2015-10-01 MED ORDER — HYDROCOD POLST-CPM POLST ER 10-8 MG/5ML PO SUER
5.0000 mL | Freq: Every evening | ORAL | Status: DC | PRN
Start: 2015-10-01 — End: 2015-10-02
  Administered 2015-10-01: 5 mL via ORAL
  Filled 2015-10-01: qty 5

## 2015-10-01 MED ORDER — ENOXAPARIN SODIUM 80 MG/0.8ML ~~LOC~~ SOLN
1.0000 mg/kg | Freq: Once | SUBCUTANEOUS | Status: AC
  Administered 2015-10-01: 80 mg via SUBCUTANEOUS
  Filled 2015-10-01: qty 0.8

## 2015-10-01 MED ORDER — DOLUTEGRAVIR SODIUM 50 MG PO TABS
50.0000 mg | ORAL_TABLET | Freq: Every day | ORAL | Status: DC
  Administered 2015-10-02: 50 mg via ORAL
  Filled 2015-10-01 (×2): qty 1

## 2015-10-01 MED ORDER — SODIUM CHLORIDE 0.9 % IV SOLN
INTRAVENOUS | Status: DC
  Administered 2015-10-01 – 2015-10-02 (×2): via INTRAVENOUS

## 2015-10-01 MED ORDER — KETOTIFEN FUMARATE 0.025 % OP SOLN
1.0000 [drp] | Freq: Every day | OPHTHALMIC | Status: DC | PRN
  Filled 2015-10-01: qty 5

## 2015-10-01 MED ORDER — ENALAPRIL MALEATE 10 MG PO TABS
10.0000 mg | ORAL_TABLET | Freq: Every day | ORAL | Status: DC
  Administered 2015-10-02: 10 mg via ORAL
  Filled 2015-10-01 (×2): qty 1

## 2015-10-01 MED ORDER — BISACODYL 10 MG RE SUPP: 10.0000 mg | Freq: Every day | RECTAL | Status: DC | PRN

## 2015-10-01 MED ORDER — ENOXAPARIN SODIUM 80 MG/0.8ML ~~LOC~~ SOLN
1.0000 mg/kg | Freq: Two times a day (BID) | SUBCUTANEOUS | Status: DC
  Administered 2015-10-02: 80 mg via SUBCUTANEOUS
  Filled 2015-10-01: qty 0.8

## 2015-10-01 MED ORDER — ENALAPRIL-HYDROCHLOROTHIAZIDE 10-25 MG PO TABS: 1.0000 | ORAL_TABLET | Freq: Every day | ORAL | Status: DC

## 2015-10-01 MED ORDER — ATORVASTATIN CALCIUM 10 MG PO TABS
10.0000 mg | ORAL_TABLET | Freq: Every day | ORAL | Status: DC
  Administered 2015-10-01: 10 mg via ORAL
  Filled 2015-10-01: qty 1

## 2015-10-01 MED ORDER — EMTRICITABINE-TENOFOVIR AF 200-25 MG PO TABS
1.0000 | ORAL_TABLET | Freq: Every day | ORAL | Status: DC
  Administered 2015-10-02: 1 via ORAL
  Filled 2015-10-01 (×2): qty 1

## 2015-10-01 NOTE — Progress Notes (Signed)
ANTICOAGULATION CONSULT NOTE - Initial Consult  Pharmacy Consult for Enoxaparin Indication: DVT  Allergies  Allergen Reactions  . Efavirenz Rash    Patient Measurements: Height: 5\' 6"  (167.6 cm) Weight: 175 lb 14.8 oz (79.8 kg) IBW/kg (Calculated) : 63.8  Vital Signs: Temp: 98.5 F (36.9 C) (12/26 1356) Temp Source: Oral (12/26 1356) BP: 121/72 mmHg (12/26 1356) Pulse Rate: 77 (12/26 1356)  Labs:  Recent Labs  10/01/15 1014  HGB 15.2  HCT 42.3  PLT 138*  LABPROT 24.4*  INR 2.22*  CREATININE 1.08    Estimated Creatinine Clearance: 71.3 mL/min (by C-G formula based on Cr of 1.08).   Medical History: Past Medical History  Diagnosis Date  . HTN (hypertension)   . Hyperlipidemia   . HIV (human immunodeficiency virus infection) ([place])   . GERD (gastroesophageal reflux disease)     Assessment: 59 yo M presents on 12/26 with RLE swelling. Doppler showed acute DVT in RLE. Has been on coumadin at home for history of PE. Had a subtherapeutic INR of 1.7 recently in which his dose was increased. INR on admit therapeutic at 2.2 but may have gotten clot when subtherapeutic.  Reports compliance with coumadin. MD would like to hold for now. May need to be considered for coumadin failure and switch oral agents. IM service will discuss with hematology. In the meantime will start on enoxaparin treatment dosing per pharmacy.   Goal of Therapy:  Monitor platelets by anticoagulation protocol: Yes   Plan:  Give enoxaparin 80mg  [place] x 1 this afternoon, then start 80mg  [place] Q12 tomorrow morning Monitor CBC, s/s of bleed F/U transition to a different oral agent  Elenor Quinones, PharmD, BCPS Clinical Pharmacist Pager [phone] 10/01/2015 2:37 PM

## 2015-10-01 NOTE — ED Notes (Signed)
NP at the bedside

## 2015-10-01 NOTE — ED Notes (Signed)
Pt returned from Vascular. 

## 2015-10-01 NOTE — ED Notes (Addendum)
Per PT. PT has HX of DVT and PE. Pt reports swelling in the right foot in the last couple days. Denies any recent travel or pain. Pt has been on blood thinners, Lovenox and Coumadin. Pt had Coumadin level checked last week and it was out of therapeutic range.

## 2015-10-01 NOTE — ED Notes (Signed)
Mali, RN at the bedside attempting

## 2015-10-01 NOTE — Progress Notes (Signed)
*  Preliminary Results* Bilateral lower extremity venous duplex completed. The right lower extremity is positive for acute deep vein thrombosis involving the right femoral and gastrocnemius veins. There is no evidence of left lower extremity deep vein thrombosis or bilateral Baker's cyst.  Preliminary results discussed with Dr. Ashok Cordia.  10/01/2015  Maudry Mayhew, RVT, RDCS, RDMS

## 2015-10-01 NOTE — ED Notes (Signed)
Pt to be transported upstairs by Gerald Stabs, EMT

## 2015-10-01 NOTE — ED Notes (Signed)
Hospitalist at the bedside 

## 2015-10-01 NOTE — H&P (Signed)
[facility] History and Physical  Michael [name] [id number] DOB: 05/09/1956 DOA: 10/01/2015  Referring physician: Emergency Department PCP: Rachell Cipro, MD   CHIEF COMPLAINT:   RLE swelling    HPI: Michael [name] is a 59 y.o. male with HIV, HTN and history of PE. In 2015 patient had a post-op pulmonary embolism following shoulder surgery. A few months ago patient underwent right foot surgery for plantar fasciitis. Postoperatively patient was wearing a cast on his right lower extremity. After cast removal in October patient was in the [facility] visiting friends when he developed shortness of breath. Patient was taken to a local emergency department, diagnosed with a pulmonary embolism. He was transported to [facility] where doppler was consistent with an acute DVT involving the soleal vein of the RLE. Patient started on Lovenox and warfarin. He has been maintained on coumadin. Dose recently increased on Mondays and Thursdays because of a subtherapeutic INR of 1.7 on 09/17/15.   She has been undergoing physical therapy for his right foot, therapist noticed mild swelling in right foot. Patient states he himself noticed swelling over the last week. No pain in the extremity. No chest pain or shortness of breath. Patient is on amoxicillin, prescribed Christmas Eve for sinusitis  ED COURSE:   Labs:   Troponin 0.07 Potassium 3.2  Medications - No data to display  Review of Systems  Constitutional: Negative.   HENT: Positive for congestion.   Eyes: Negative.   Respiratory: Positive for cough.   Cardiovascular: Positive for leg swelling.  Gastrointestinal: Negative.   Genitourinary: Negative.   Musculoskeletal: Negative.   Skin: Negative.   Neurological: Negative.   Endo/Heme/Allergies: Negative.   Psychiatric/Behavioral: Negative.     Past Medical History  Diagnosis Date  . HTN (hypertension)   . Hyperlipidemia   . HIV (human immunodeficiency virus infection) ([place])   . GERD  (gastroesophageal reflux disease)    Past Surgical History  Procedure Laterality Date  . Rotator cuff surgery      right and left  . Laminectomy  2010    C4/C5  . Appendectomy    . Tonsillectomy    . Colonoscopy    . Shoulder arthroscopy with subacromial decompression Right 12/15/2013    Procedure: RIGHT SHOULDER ARTHROSCOPY WITH EXTENSIVE DEBRIDEMENT OF ROTATOR CUFF,SUBACROMIAL DECOMPRESSION, DEBRIDMENT OF BURSA;  Surgeon: Ninetta Lights, MD;  Location: [facility];  Service: Orthopedics;  Laterality: Right;    SOCIAL HISTORY:  reports that he has never smoked. He has never used smokeless tobacco. He reports that he drinks alcohol. He reports that he does not use illicit drugs. Lives: at home with his Husband   Assistive devices:   None needed for ambulation.   Allergies  Allergen Reactions  . Efavirenz Rash    Family History  Problem Relation Age of Onset  . Diabetes Mother   . Heart attack Paternal Grandfather   . Deep vein thrombosis Father     Prior to Admission medications   Medication Sig Start Date End Date Taking? Authorizing Provider  acetaminophen (TYLENOL) 500 MG tablet Take 1,000 mg by mouth every 6 (six) hours as needed for headache. Reported on 09/29/2015    Historical Provider, MD  ALPRAZOLAM XR 1 MG 24 hr tablet Take 3 mg by mouth daily.  01/19/15   Historical Provider, MD  amoxicillin-clavulanate (AUGMENTIN) 875-125 MG tablet Take 1 tablet by mouth 2 (two) times daily. 09/29/15   Robyn Haber, MD  atorvastatin (LIPITOR) 10 MG tablet Take 10 mg  by mouth at bedtime.     Historical Provider, MD  beclomethasone (BECONASE AQ) 42 MCG/SPRAY nasal spray Place 2 sprays into both nostrils daily as needed (seasonal allergies (fall season)). Reported on 09/29/2015    Historical Provider, MD  bifidobacterium infantis (ALIGN) capsule Take 1 capsule by mouth daily. Reported on 09/29/2015    Historical Provider, MD  chlorpheniramine-HYDROcodone (TUSSIONEX  PENNKINETIC ER) 10-8 MG/5ML SUER Take 5 mLs by mouth at bedtime as needed for cough. 09/29/15   Robyn Haber, MD  darunavir-cobicistat (PREZCOBIX) 800-150 MG per tablet Take 1 tablet by mouth daily. Swallow whole. Do NOT crush, break or chew tablets. Take with food.    Historical Provider, MD  dolutegravir (TIVICAY) 50 MG tablet Take 50 mg by mouth daily.    Historical Provider, MD  emtricitabine-tenofovir AF (DESCOVY) 200-25 MG tablet Take 1 tablet by mouth daily.    Historical Provider, MD  enalapril-hydrochlorothiazide (VASERETIC) 10-25 MG per tablet Take 1 tablet by mouth daily. 12/09/12   Historical Provider, MD  esomeprazole (NEXIUM) 20 MG capsule Take 20 mg by mouth daily.  12/07/14   Historical Provider, MD  ketotifen (ZADITOR) 0.025 % ophthalmic solution Place 1 drop into both eyes daily as needed (itching). Reported on 09/29/2015    Historical Provider, MD  montelukast (SINGULAIR) 10 MG tablet Take 10 mg by mouth at bedtime. Reported on 09/29/2015 08/17/14   Historical Provider, MD  omega-3 acid ethyl esters (LOVAZA) 1 G capsule Take 1 g by mouth 2 (two) times daily.    Historical Provider, MD  valACYclovir (VALTREX) 500 MG tablet Take 500 mg by mouth daily.  01/28/13   Historical Provider, MD  warfarin (COUMADIN) 7.5 MG tablet Take as directed by [facility] 08/27/15   Brand Males, MD   PHYSICAL EXAM: Filed Vitals:   10/01/15 1030 10/01/15 1116 10/01/15 1130 10/01/15 1145  BP: 129/79 144/81 148/88 141/86  Pulse: 75 84 81 87  Temp:      TempSrc:      Resp:  16    SpO2: 96% 97% 98% 96%    Wt Readings from Last 3 Encounters:  09/29/15 80.377 kg (177 lb 3.2 oz)  08/20/15 78.472 kg (173 lb)  07/28/15 77.747 kg (171 lb 6.4 oz)    General:  Pleasant white male. Appears calm and comfortable Eyes: PER, normal lids, irises & conjunctiva ENT: grossly normal hearing, lips & tongue Neck: no LAD, no masses Cardiovascular: RRR, no murmurs. No LE edema. Negative R  Homan's Respiratory: Respirations even and unlabored. Normal respiratory effort. Lungs CTA bilaterally, no wheezes / rales .   Abdomen: soft, non-distended, non-tender, active bowel sounds. No obvious masses.  Skin: no rash seen on limited exam Musculoskeletal: grossly normal tone BUE/BLE Psychiatric: grossly normal mood and affect, speech fluent and appropriate Neurologic: grossly non-focal.         LABS ON ADMISSION:    Basic Metabolic Panel:  Recent Labs Lab 10/01/15 1014  NA 139  K 3.2*  CL 101  CO2 26  GLUCOSE 100*  BUN 11  CREATININE 1.08  CALCIUM 9.8   CBC:  Recent Labs Lab 10/01/15 1014  WBC 9.2  NEUTROABS 5.1  HGB 15.2  HCT 42.3  MCV 96.1  PLT 138*    BNP (last 3 results)  Recent Labs  07/29/15 1056  BNP 313.6*   CREATININE: 1.08 (10/01/15 1014) Estimated creatinine clearance - 70.5 mL/min  Radiological Exams on Admission: Bilateral lower extremity venous duplex completed. The right lower extremity is positive  for acute deep vein thrombosis involving the right femoral and gastrocnemius veins. There is no evidence of left lower extremity deep vein thrombosis or bilateral Baker's cyst.   ASSESSMENT / PLAN   Hx of pulmonary embolism (2015 after shoulder surgery).  Patient evaluated by Hematology in February of this year after 2015 PE and PE felt to be related to orthopedic surgery. Clotting studies unrevealing. Daily baby asa was recommended.    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 . Also found to have DVT of right soleus vein / cast. Has been on coumadin since. Now with recurrent RLE swelling and doppler + for acute DVT in right femoral and gastrocnemius veins. Reports warfarin compliance. Non-smoker. Recent subtherapeutic INR of 1.7 resulting in dosage increase 09/21/15.  INR today 2.2.  -admit to Observation.  -Will contact Hematology, perhaps patient should be changed from coumadin to Eliquis  or Xarelto. Hold Coumadin in meantime.  -No dyspnea or other symptoms at present to suggest PE at present. Of note there was no mention of right heart strain on echo in October at time of PE.  Mildly elevated troponin. Asymptomatic.  -cycle troponins   HIV, on treatment -continue home HIV meds  Essential hypertension. Stable --continue home vasoretic  Sinusitis. Complete course of Amoxicillin prescribed by PCP  GERD. Continue home PPI  CONSULTANTS:     Code Status: full DVT Prophylaxis: On coumadin.  Family Communication:  Patient alert, oriented and understands plan of care.  Disposition Plan: Discharge to home in 2-3 days   Time spent: 60 minutes Tye Savoy  NP [facility] Pager [phone]

## 2015-10-01 NOTE — Progress Notes (Signed)
Pt admitted to the unit at 1350. Pt mental status is alert & oriented x4. Pt oriented to room, staff, and call bell. Skin is intact. Full assessment charted in [facility]. Call bell within reach. Visitor guidelines reviewed w/ pt and/or family.

## 2015-10-01 NOTE — ED Notes (Signed)
Attempted IV x2. Unable to access.  

## 2015-10-01 NOTE — Progress Notes (Signed)
Report called and received from [facility] in ED. Pt to be admitted to 5w19.

## 2015-10-01 NOTE — ED Notes (Signed)
Phlebotomy at the bedside  

## 2015-10-01 NOTE — ED Notes (Signed)
MD at the bedside  

## 2015-10-01 NOTE — ED Provider Notes (Signed)
CSN: [id number]     Arrival date & time 10/01/15  [id number] History   First MD Initiated Contact with Patient 10/01/15 [phone]     Chief Complaint  Patient presents with  . Leg Swelling     (Consider location/radiation/quality/duration/timing/severity/associated sxs/prior Treatment) HPI Comments: Pt comes in with c/o intermittent swelling to his right lower extremity. Pt states that he had a pe and dvt diagnosed 2 months ago after being in a cast for plantar fascitis. He has been doing better since being on the coumadin and lovenox but then he has noticed calf and foot swelling over the last couple of days. Denies pain or redness. He has not had a repeat ultrasound to see that the clot has resolved. He denies cp or sob.  The history is provided by the patient. No language interpreter was used.    Past Medical History  Diagnosis Date  . HTN (hypertension)   . Hyperlipidemia   . HIV (human immunodeficiency virus infection) ([place])   . GERD (gastroesophageal reflux disease)    Past Surgical History  Procedure Laterality Date  . Rotator cuff surgery      right and left  . Laminectomy  2010    C4/C5  . Appendectomy    . Tonsillectomy    . Colonoscopy    . Shoulder arthroscopy with subacromial decompression Right 12/15/2013    Procedure: RIGHT SHOULDER ARTHROSCOPY WITH EXTENSIVE DEBRIDEMENT OF ROTATOR CUFF,SUBACROMIAL DECOMPRESSION, DEBRIDMENT OF BURSA;  Surgeon: Ninetta Lights, MD;  Location: [facility];  Service: Orthopedics;  Laterality: Right;   Family History  Problem Relation Age of Onset  . Diabetes Mother   . Heart attack Paternal Grandfather   . Deep vein thrombosis Father    Social History  Substance Use Topics  . Smoking status: Never Smoker   . Smokeless tobacco: Never Used  . Alcohol Use: 0.0 oz/week    0 Standard drinks or equivalent per week     Comment: rarely.    Review of Systems  All other systems reviewed and are negative.     Allergies   Efavirenz  Home Medications   Prior to Admission medications   Medication Sig Start Date End Date Taking? Authorizing Provider  acetaminophen (TYLENOL) 500 MG tablet Take 1,000 mg by mouth every 6 (six) hours as needed for headache. Reported on 09/29/2015    Historical Provider, MD  ALPRAZOLAM XR 1 MG 24 hr tablet Take 3 mg by mouth daily.  01/19/15   Historical Provider, MD  amoxicillin-clavulanate (AUGMENTIN) 875-125 MG tablet Take 1 tablet by mouth 2 (two) times daily. 09/29/15   Robyn Haber, MD  atorvastatin (LIPITOR) 10 MG tablet Take 10 mg by mouth at bedtime.     Historical Provider, MD  beclomethasone (BECONASE AQ) 42 MCG/SPRAY nasal spray Place 2 sprays into both nostrils daily as needed (seasonal allergies (fall season)). Reported on 09/29/2015    Historical Provider, MD  bifidobacterium infantis (ALIGN) capsule Take 1 capsule by mouth daily. Reported on 09/29/2015    Historical Provider, MD  chlorpheniramine-HYDROcodone (TUSSIONEX PENNKINETIC ER) 10-8 MG/5ML SUER Take 5 mLs by mouth at bedtime as needed for cough. 09/29/15   Robyn Haber, MD  darunavir-cobicistat (PREZCOBIX) 800-150 MG per tablet Take 1 tablet by mouth daily. Swallow whole. Do NOT crush, break or chew tablets. Take with food.    Historical Provider, MD  dolutegravir (TIVICAY) 50 MG tablet Take 50 mg by mouth daily.    Historical Provider, MD  emtricitabine-tenofovir AF (DESCOVY)  200-25 MG tablet Take 1 tablet by mouth daily.    Historical Provider, MD  enalapril-hydrochlorothiazide (VASERETIC) 10-25 MG per tablet Take 1 tablet by mouth daily. 12/09/12   Historical Provider, MD  enoxaparin (LOVENOX) 120 MG/0.8ML injection Inject 0.8 mLs (120 mg total) into the skin daily. Patient not taking: Reported on 08/20/2015 07/31/15   Reyne Dumas, MD  esomeprazole (NEXIUM) 20 MG capsule Take 20 mg by mouth daily.  12/07/14   Historical Provider, MD  ketotifen (ZADITOR) 0.025 % ophthalmic solution Place 1 drop into both eyes  daily as needed (itching). Reported on 09/29/2015    Historical Provider, MD  montelukast (SINGULAIR) 10 MG tablet Take 10 mg by mouth at bedtime. Reported on 09/29/2015 08/17/14   Historical Provider, MD  omega-3 acid ethyl esters (LOVAZA) 1 G capsule Take 1 g by mouth 2 (two) times daily.    Historical Provider, MD  valACYclovir (VALTREX) 500 MG tablet Take 500 mg by mouth daily.  01/28/13   Historical Provider, MD  warfarin (COUMADIN) 7.5 MG tablet Take as directed by Coumadin Clinic 08/27/15   Brand Males, MD   BP 154/100 mmHg  Pulse 96  Temp(Src) 98.2 F (36.8 C) (Oral)  Resp 16  SpO2 100% Physical Exam  Constitutional: He is oriented to person, place, and time. He appears well-developed and well-nourished.  Cardiovascular: Normal rate and regular rhythm.   Pulmonary/Chest: Effort normal.  Abdominal: Soft. Bowel sounds are normal.  Musculoskeletal: Normal range of motion.  Pulses intact. No swelling noted to right lower extremity  Neurological: He is alert and oriented to person, place, and time. Coordination normal.  Skin: Skin is warm and dry.  Nursing note and vitals reviewed.   ED Course  Procedures (including critical care time) Labs Review Labs Reviewed  BASIC METABOLIC PANEL - Abnormal; Notable for the following:    Potassium 3.2 (*)    Glucose, Bld 100 (*)    All other components within normal limits  CBC WITH DIFFERENTIAL/PLATELET - Abnormal; Notable for the following:    MCH 34.5 (*)    Platelets 138 (*)    All other components within normal limits  PROTIME-INR - Abnormal; Notable for the following:    Prothrombin Time 24.4 (*)    INR 2.22 (*)    All other components within normal limits    Imaging Review No results found. I have personally reviewed and evaluated these images and lab results as part of my medical decision-making.   EKG Interpretation None      MDM   Final diagnoses:  DVT (deep vein thrombosis) in pregnancy    11:28 AM Pt  with worsening dvt. As pt is on coumadin already think pt needs to be admitted for further work up. Pt is not having any cp or sob at this time  12:09 PM Pt to be admitted by triad. Pt in agreement of plan  Glendell Docker, NP 10/01/15 [place], MD 10/02/15 [phone]

## 2015-10-01 NOTE — Telephone Encounter (Signed)
Michael [name] called this morning complaining of swelling in his leg where he had a DVT before which is new.  No pain.  I recommended that he go to the Urgent Care at [facility] right away, if they are closed, then go to the ED

## 2015-10-01 NOTE — Progress Notes (Signed)
Anticoagulation suggestions:  59 yo who was admitted for an extension of his DVT. He has had a hx of provoked and unprovoked DVTs/PEs. He has been on coumadin for this. His situation became more difficult due to his antitretrovirals especially the Prezcobix (Darunavir/cobicistat). The cobicistat is responsible for most of the interaction with NOACs. I suspect that is the reason why [facility] used coumadin for his anticoag. Dr. Megan Salon asked me for suggestion about his anticoagulant. We have a couple of options with anticoagulation since Prezcobix will likely be required in his case.    Option #1 - Change lovenox to Apixaban 5mg  BID (half dose) x 7 days then 2.5mg  PO BID thereafter Option #2 - Lovenox x 5 days then Pradaxa 150mg  PO BID thereafter (separate out Prezcobix and Pradaxa by 2 hrs) Option #3 - Lovenox x 5 days then Edoxaban 30mg  PO qday thereafter  Xarelto (rivaroxaban) is Contraindicated with cobicistat  Onnie Boer, PharmD Pager: [phone] 10/01/2015 2:58 PM

## 2015-10-02 ENCOUNTER — Other Ambulatory Visit: Payer: Self-pay | Admitting: Oncology

## 2015-10-02 DIAGNOSIS — I10 Essential (primary) hypertension: Secondary | ICD-10-CM

## 2015-10-02 DIAGNOSIS — E876 Hypokalemia: Secondary | ICD-10-CM | POA: Diagnosis not present

## 2015-10-02 DIAGNOSIS — I82402 Acute embolism and thrombosis of unspecified deep veins of left lower extremity: Secondary | ICD-10-CM | POA: Diagnosis not present

## 2015-10-02 DIAGNOSIS — B20 Human immunodeficiency virus [HIV] disease: Secondary | ICD-10-CM | POA: Diagnosis not present

## 2015-10-02 DIAGNOSIS — I82411 Acute embolism and thrombosis of right femoral vein: Secondary | ICD-10-CM | POA: Diagnosis not present

## 2015-10-02 LAB — CBC
HCT: 39.4 % (ref 39.0–52.0)
Hemoglobin: 14.1 g/dL (ref 13.0–17.0)
MCH: 34.5 pg — AB (ref 26.0–34.0)
MCHC: 35.8 g/dL (ref 30.0–36.0)
MCV: 96.3 fL (ref 78.0–100.0)
PLATELETS: 126 10*3/uL — AB (ref 150–400)
RBC: 4.09 MIL/uL — AB (ref 4.22–5.81)
RDW: 12.7 % (ref 11.5–15.5)
WBC: 7 10*3/uL (ref 4.0–10.5)

## 2015-10-02 LAB — BASIC METABOLIC PANEL
Anion gap: 8 (ref 5–15)
BUN: 8 mg/dL (ref 6–20)
CALCIUM: 8.8 mg/dL — AB (ref 8.9–10.3)
CO2: 30 mmol/L (ref 22–32)
CREATININE: 1.05 mg/dL (ref 0.61–1.24)
Chloride: 101 mmol/L (ref 101–111)
GFR calc Af Amer: >60 mL/min (ref >60)
GFR calc non Af Amer: >60 mL/min (ref >60)
Glucose, Bld: 101 mg/dL — ABNORMAL HIGH (ref 65–99)
Potassium: 3.1 mmol/L — ABNORMAL LOW (ref 3.5–5.1)
SODIUM: 139 mmol/L (ref 135–145)

## 2015-10-02 LAB — T-HELPER CELLS (CD4) COUNT (NOT AT ARMC)
CD4 % Helper T Cell: 30 % — ABNORMAL LOW (ref 33–55)
CD4 T CELL ABS: 750 /uL (ref 400–2700)

## 2015-10-02 LAB — D-DIMER, QUANTITATIVE (NOT AT ARMC): D DIMER QUANT: 0.57 ug{FEU}/mL — AB (ref 0.00–0.50)

## 2015-10-02 MED ORDER — WARFARIN SODIUM 7.5 MG PO TABS: ORAL_TABLET | ORAL | Status: DC

## 2015-10-02 MED ORDER — WARFARIN SODIUM 7.5 MG PO TABS: 7.5000 mg | ORAL_TABLET | ORAL | Status: DC

## 2015-10-02 MED ORDER — ENOXAPARIN SODIUM 120 MG/0.8ML ~~LOC~~ SOLN: 1.5000 mg/kg | SUBCUTANEOUS | Status: DC

## 2015-10-02 MED ORDER — ENOXAPARIN SODIUM 120 MG/0.8ML ~~LOC~~ SOLN
1.5000 mg/kg | SUBCUTANEOUS | Status: DC
  Filled 2015-10-02: qty 0.8

## 2015-10-02 MED ORDER — WARFARIN SODIUM 7.5 MG PO TABS: 11.2500 mg | ORAL_TABLET | ORAL | Status: DC

## 2015-10-02 NOTE — Progress Notes (Signed)
Pt admitted for DVT in the lower right leg. Pt has Lovenox and SCDs ordered. Pt on PO ABx , Augmentin for Sinusitis.

## 2015-10-02 NOTE — Discharge Instructions (Signed)
Follow with Primary MD DEWEY,ELIZABETH, MD in 7 days  ° °Get CBC, CMP, 2 view Chest X ray checked  by Primary MD next visit.  ° ° °Activity: As tolerated with Full fall precautions use walker/cane & assistance as needed ° ° °Disposition Home   ° ° °Diet: Heart Healthy  ° °For Heart failure patients - Check your Weight same time everyday, if you gain over 2 pounds, or you develop in leg swelling, experience more shortness of breath or chest pain, call your Primary MD immediately. Follow Cardiac Low Salt Diet and 1.5 lit/day fluid restriction. ° ° °On your next visit with your primary care physician please Get Medicines reviewed and adjusted. ° ° °Please request your Prim.MD to go over all Hospital Tests and Procedure/Radiological results at the follow up, please get all Hospital records sent to your Prim MD by signing hospital release before you go home. ° ° °If you experience worsening of your admission symptoms, develop shortness of breath, life threatening emergency, suicidal or homicidal thoughts you must seek medical attention immediately by calling 911 or calling your MD immediately  if symptoms less severe. ° °You Must read complete instructions/literature along with all the possible adverse reactions/side effects for all the Medicines you take and that have been prescribed to you. Take any new Medicines after you have completely understood and accpet all the possible adverse reactions/side effects.  ° °Do not drive, operating heavy machinery, perform activities at heights, swimming or participation in water activities or provide baby sitting services if your were admitted for syncope or siezures until you have seen by Primary MD or a Neurologist and advised to do so again. ° °Do not drive when taking Pain medications.  ° ° °Do not take more than prescribed Pain, Sleep and Anxiety Medications ° °Special Instructions: If you have smoked or chewed Tobacco  in the last 2 yrs please stop smoking, stop any regular  Alcohol  and or any Recreational drug use. ° °Wear Seat belts while driving. ° ° °Please note ° °You were cared for by a hospitalist during your hospital stay. If you have any questions about your discharge medications or the care you received while you were in the hospital after you are discharged, you can call the unit and asked to speak with the hospitalist on call if the hospitalist that took care of you is not available. Once you are discharged, your primary care physician will handle any further medical issues. Please note that NO REFILLS for any discharge medications will be authorized once you are discharged, as it is imperative that you return to your primary care physician (or establish a relationship with a primary care physician if you do not have one) for your aftercare needs so that they can reassess your need for medications and monitor your lab values. ° °

## 2015-10-02 NOTE — Telephone Encounter (Signed)
Hi Dr. Beryle Beams, Patient requested that I contact you for clarification on the duration of Lovenox therapy. Per Dr. Ival Bible note, she states to d/c Lovenox once INR > 2.5 x 48 hours. Please let me know---patient specifically requested that I check with you first for approval.  Thank you

## 2015-10-02 NOTE — Consult Note (Signed)
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 Leg Swelling    HPI:  59 year old man known to me from previous office consultation subsequent to an initial provoked pulmonary embolus which occurred 2 weeks after right shoulder arthroplasty on 01/31/2014. He was treated with heparin then 6 months of Coumadin. Hypercoagulation evaluation revealed no congenital or acquired defects. Initial positive lupus anticoagulant was spurious since it was drawn while he was on heparin. Repeat was negative. He had an elevated IgM anticardiolipin antibody which was also not reproducibly elevated when repeated on 02/01/2014. He had an elevated IgA against beta-2 glycoprotein 1 but currently this is not felt to be a clinically significant antiphospholipid antibody. I recommended discontinuation of anticoagulation at 6 months and consideration of low-dose aspirin maintenance. I saw the patient on 12/01/2014. Subsequent to that visit in March 2016 he started to have chronic problems with plantar fascitis of his right foot. He required boot immobilization. He then had a cast placed. He saw sports medicine Dr. Micheline Chapman on multiple occasions over the last few months. The cast was removed on July 24, 2015. On October 22, he presented with dyspnea and was found to have a saddle pulmonary embolus with a concomitant soleus vein right lower extremity DVT. Warfarin resumed at that time.  He presented last week with a persistent productive cough and nasal congestion. He was seen by his family physician. He was started on amoxicillin on December 24. It is really not clear to me what happened next. He did see a little bit of edema at the proximal extent of elastic stockings that he was wearing on his right leg. He denied any calf pain pain. No chest pain or dyspnea. A venous Doppler study was  ordered on December 26 by a nurse practitioner. Preliminary results showed clot in the right femoral and right gastrocs veins. He was directed to the emergency department for admission. INR was 2.2 on current Coumadin. Dose   7.5 mg daily recently adjusted on December 12 when INR was 1.7. Dose was increased to 1-1/2 tablets twice weekly and one 7.5 mg tablet on the other days of the week. Coumadin as monitored in the [place] [facility].  He has HIV diagnosed in 1984. Well-controlled on current antiretroviral regimen. History of hepatitis B. He has had no other acute or recent illness other than noted above.   Past Medical History  Diagnosis Date  . HTN (hypertension)   . Hyperlipidemia   . HIV (human immunodeficiency virus infection) ([place])   . GERD (gastroesophageal reflux disease)   . Pulmonary embolism ([place]) 12/2013  . DVT (deep venous thrombosis) ([place]) 07/2015; 10/01/2015    RLE; RLE  . 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 Childhood asthma   . Pneumonia ~ 2000  . Hepatitis B   . Anxiety   :  Past Surgical History  Procedure Laterality Date  . Shoulder arthroscopy w/ rotator cuff repair Bilateral 2000's    "twice on right; once on the left"  . Anterior cervical decomp/discectomy fusion  2010    C40-C5  . Appendectomy    . Tonsillectomy    . Colonoscopy    . Shoulder arthroscopy with subacromial decompression Right 12/15/2013    Procedure: RIGHT SHOULDER ARTHROSCOPY WITH EXTENSIVE DEBRIDEMENT OF ROTATOR CUFF,SUBACROMIAL DECOMPRESSION, DEBRIDMENT OF BURSA;  Surgeon: Ninetta Lights, MD;  Location: [facility];  Service: Orthopedics;  Laterality: Right;  . Back surgery    :  . amoxicillin-clavulanate  1 tablet Oral BID  . atorvastatin  10 mg Oral QHS  . darunavir-cobicistat  1 tablet Oral Daily  . dolutegravir  50 mg Oral Daily  . emtricitabine-tenofovir AF  1 tablet Oral Daily  . enalapril  10 mg Oral Daily   And  . hydrochlorothiazide  25 mg Oral  Daily  . enoxaparin (LOVENOX) injection  1.5 mg/kg Subcutaneous Q24H  . omega-3 acid ethyl esters  1 g Oral BID  . valACYclovir  500 mg Oral Daily  :  Allergies  Allergen Reactions  . Efavirenz Rash  :  Family History  Problem Relation Age of Onset  . Diabetes Mother   . Heart attack Paternal Grandfather   . Deep vein thrombosis Father   :  Social History   Social History  . Marital Status: Married    Spouse Name: N/A  . Number of Children: N/A  . Years of Education: N/A   Occupational History  . Clinical research [employer]   Social History Main Topics  . Smoking status: Never Smoker   . Smokeless tobacco: Never Used  . Alcohol Use: 0.0 oz/week    0 Standard drinks or equivalent per week     Comment: 10/01/2015 "might have a drink when I go out to dinner; very occasional"  . Drug Use: No  . Sexual Activity: Yes   Other Topics Concern  . Not on file   Social History Narrative  :  ROS: See history of present illness. No cardiorespiratory symptoms. His plantar fascitis has subsided significantly compared with the last few months.   Vitals: Filed Vitals:   10/01/15 2215 10/02/15 0508  BP: 135/84 117/65  Pulse: 68 61  Temp: 98 F (36.7 C) 98.1 F (36.7 C)  Resp: 18 16    PHYSICAL EXAM: General appearance: Well-nourished Caucasian man HEENT: Pharynx no erythema, exudate, or ulcer. Lymph Nodes: No cervical, supraclavicular, or axillary adenopathy Resp: Lungs clear to auscultation and resonant to percussion Cardio: Regular cardiac rhythm without murmur gallop or rub Vascular: Carotids 2+ no bruits. Dorsalis pedis pulses 2+ symmetric, posterior tibial pulses 1+ symmetric Breasts: GI: Abdomen soft nontender, no mass, no organomegaly GU: Extremities: There is no edema. No calf tenderness. He has a large Band-Aid on the heel of his right foot. Neurologic: He is alert, awake, oriented 3, cranial nerves grossly normal, pupils equal round and reactive, motor  strength 5 over 5. Skin: No rash or ecchymosis  Labs:   Recent Labs  10/01/15 1014 10/02/15 0715  WBC 9.2 7.0  HGB 15.2 14.1  HCT 42.3 39.4  PLT 138* 126*    Recent Labs  10/01/15 1014 10/02/15 0715  NA 139 139  K 3.2* 3.1*  CL 101 101  CO2 26 30  GLUCOSE 100* 101*  BUN 11 8  CREATININE 1.08 1.05  CALCIUM 9.8 8.8*      Images Studies/Results: See discussion above    Principal Problem:   Acute deep vein thrombosis (DVT) of right lower extremity ([facility]) Active Problems:   GASTROESOPHAGEAL REFLUX DISEASE   HIV disease (HCC)   Essential hypertension   Sinusitis   Hypokalemia   DVT (deep venous thrombosis) ([facility])  Impression: It is really not clear to me that this patient has actually suffered a new, acute, venous thrombotic event. Clot more likely subacute  He has been dealing with chronic, plantar fasciitis for the last  9 months. He has had almost constant immobilization of the right lower extremity which was the site of a previous distal DVT. He had a major pulmonary embolus in October. His Coumadin level has dipped into the subtherapeutic range recently.    Recommendation: Given major, recurrent, thrombotic events, despite the fact that most of these were likely provoked, at this point in time, I would recommend chronic anticoagulation with annual review. I would continue Lovenox for a total of 10 days. It has anti-inflammatory as well as antithrombotic effects. I would continue warfarin but I would target an INR of 2.5-3.5. I would not change him to a target specific oral anticoagulant (Xarelto or Pradaxa or drugs like them) in view of the known interaction with HIV anti-retroviral drugs and the  inability to measure therapeutic levels. In addition, there is no data to support that they are better anticoagulants then warfarin although they may lead to less bleeding complications in patients who need to be on long-term anticoagulants. In view of the above  history, I do not feel that we need to commit him to long-term parenteral anticoagulants.   Alyson Locket [facility] 10/02/2015, 9:56 AM

## 2015-10-02 NOTE — Care Management Note (Signed)
Case Management Note  Patient Details  Name: ZAYLOR JAQUISH MRN: [id number] Date of Birth: January 06, 1956  Subjective/Objective:                 Spoke with patient in room. Patient denies any CM resources. Independent from home with spouse. Has had DVT in past, was on Lovenox. Patient states that Lovenox is covered through his insurance. He plans on continuing care with Dr Beryle Beams and [facility] after discharge.   Action/Plan:  No CM needs identified. Plan for DC today.   Expected Discharge Date:                  Expected Discharge Plan:  Home/Self Care  In-House Referral:     Discharge planning Services  CM Consult  Post Acute Care Choice:    Choice offered to:     DME Arranged:    DME Agency:     HH Arranged:    [place] Agency:     Status of Service:  Completed, signed off  Medicare Important Message Given:    Date Medicare IM Given:    Medicare IM give by:    Date Additional Medicare IM Given:    Additional Medicare Important Message give by:     If discussed at [place] of Stay Meetings, dates discussed:    Additional Comments:  Carles Collet, RN 10/02/2015, 12:19 PM

## 2015-10-02 NOTE — Discharge Summary (Signed)
Michael [name], is a 59 y.o. male  DOB 04-08-1956  MRN [id number].  Admission date:  10/01/2015  Admitting Physician  Michael Dickens, MD  Discharge Date:  10/02/2015   Primary MD  Michael Cipro, MD  Recommendations for primary care physician for things to follow:   Monitor INR closely, his goal INR now is 2.5-3.5. Stop Lovenox once INR stays above 2.5 for at least 48 hours.   Admission Diagnosis  Hypokalemia [E87.6] DVT (deep vein thrombosis) in pregnancy [O22.30]   Discharge Diagnosis  Hypokalemia [E87.6] DVT (deep vein thrombosis) in pregnancy [O22.30]     Principal Problem:   Acute deep vein thrombosis (DVT) of right lower extremity (HCC) Active Problems:   GASTROESOPHAGEAL REFLUX DISEASE   HIV disease (HCC)   Essential hypertension   Sinusitis   Hypokalemia   DVT (deep venous thrombosis) (HCC)      Past Medical History  Diagnosis Date  . HTN (hypertension)   . Hyperlipidemia   . HIV (human immunodeficiency virus infection) ([place])   . GERD (gastroesophageal reflux disease)   . Pulmonary embolism ([place]) 12/2013  . DVT (deep venous thrombosis) ([place]) 07/2015; 10/01/2015    RLE; RLE  . 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 Childhood asthma   . Pneumonia ~ 2000  . Hepatitis B   . Anxiety     Past Surgical History  Procedure Laterality Date  . Shoulder arthroscopy w/ rotator cuff repair Bilateral 2000's    "twice on right; once on the left"  . Anterior cervical decomp/discectomy fusion  2010    C40-C5  . Appendectomy    . Tonsillectomy    . Colonoscopy    . Shoulder arthroscopy with subacromial decompression Right 12/15/2013    Procedure: RIGHT SHOULDER ARTHROSCOPY WITH EXTENSIVE DEBRIDEMENT OF ROTATOR CUFF,SUBACROMIAL DECOMPRESSION, DEBRIDMENT OF BURSA;  Surgeon: Michael Lights, MD;   Location: [facility];  Service: Orthopedics;  Laterality: Right;  . Back surgery         HPI  from the history and physical done on the day of admission:   Michael [name] is a 59 y.o. male with HIV, HTN and history of PE. In 2015 patient had a post-op pulmonary embolism following shoulder surgery. A few months ago patient underwent right foot surgery for plantar fasciitis. Postoperatively patient was wearing a cast on his right lower extremity. After cast removal in October patient was in the [facility] visiting friends when he developed shortness of breath. Patient was taken to a local emergency department, diagnosed with a pulmonary embolism. He was transported to [facility] where doppler was consistent with an acute DVT involving the soleal vein of the RLE. Patient started on Lovenox and warfarin. He has been maintained on coumadin. Dose recently increased on Mondays and Thursdays because of a subtherapeutic INR of 1.7 on 09/17/15.   She has been undergoing physical therapy for his right foot, therapist noticed mild swelling in right foot. Patient states he himself noticed swelling over the last week. No pain in  the extremity. No chest pain or shortness of breath. Patient is on amoxicillin, prescribed Christmas Eve for sinusitis.     Hospital Course:     1. 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 He was on Coumadin with INR being therapeutic around 2.2, he has had degenerative studies in the past which were unremarkable, he was seen here by hematologist Dr. Beryle [name] who has placed him on Lovenox with a higher dose of Coumadin. Goal now is to have his INR between 2.5-3.5 and stop Lovenox 48 hours after his INR reaches 2.5 or above. He will follow with Dr. Beryle [name] at a later date. He is a poor candidate for new or anticoagulation agents due to HIV medications and various interactions.   2. History of HIV. Continue home medications follow with ID  clinic as before.   3. Essential hypertension. No acute issues continue home regimen.   4. GERD. on PPI continue.   5. Sinusitis. Finish antibiotic course prescribed by PCP.   Discharge Condition: Stable  Follow UP  Follow-up Information    Follow up with [facility], MD. Schedule an appointment as soon as possible for a visit in 3 days.   Specialty:  Family Medicine   Why:  and your Coumadin clinic INR check   Contact information:   [address] [place] [address] [phone]        Consults obtained - Dr Michael [name] - Haem  Diet and Activity recommendation: See Discharge Instructions below  Discharge Instructions    Discharge Instructions    Diet - low sodium heart healthy    Complete by:  As directed      Discharge instructions    Complete by:  As directed   Follow with Primary MD DEWEY,ELIZABETH, MD in 2-3 days , Stop Lovenox 2 days after INR reaches 2.5, your INR goal is 2.5 - 3.5  Get CBC, INR, CMP, 2 view Chest X ray checked  by Primary MD next visit.    Activity: As tolerated with Full fall precautions use walker/cane & assistance as needed   Disposition Home     Diet:   Heart Healthy .  For Heart failure patients - Check your Weight same time everyday, if you gain over 2 pounds, or you develop in leg swelling, experience more shortness of breath or chest pain, call your Primary MD immediately. Follow Cardiac Low Salt Diet and 1.5 lit/day fluid restriction.   On your next visit with your primary care physician please Get Medicines reviewed and adjusted.   Please request your Prim.MD to go over all Hospital Tests and Procedure/Radiological results at the follow up, please get all Hospital records sent to your Prim MD by signing hospital release before you go home.   If you experience worsening of your admission symptoms, develop shortness of breath, life threatening emergency, suicidal or homicidal thoughts you must seek medical  attention immediately by calling 911 or calling your MD immediately  if symptoms less severe.  You Must read complete instructions/literature along with all the possible adverse reactions/side effects for all the Medicines you take and that have been prescribed to you. Take any new Medicines after you have completely understood and accpet all the possible adverse reactions/side effects.   Do not drive, operating heavy machinery, perform activities at heights, swimming or participation in water activities or provide baby sitting services if your were admitted for syncope or siezures until you have seen by Primary  MD or a Neurologist and advised to do so again.  Do not drive when taking Pain medications.    Do not take more than prescribed Pain, Sleep and Anxiety Medications  Special Instructions: If you have smoked or chewed Tobacco  in the last 2 yrs please stop smoking, stop any regular Alcohol  and or any Recreational drug use.  Wear Seat belts while driving.   Please note  You were cared for by a hospitalist during your hospital stay. If you have any questions about your discharge medications or the care you received while you were in the hospital after you are discharged, you can call the unit and asked to speak with the hospitalist on call if the hospitalist that took care of you is not available. Once you are discharged, your primary care physician will handle any further medical issues. Please note that NO REFILLS for any discharge medications will be authorized once you are discharged, as it is imperative that you return to your primary care physician (or establish a relationship with a primary care physician if you do not have one) for your aftercare needs so that they can reassess your need for medications and monitor your lab values.     Increase activity slowly    Complete by:  As directed              Discharge Medications       Medication List    TAKE these medications         ALPRAZolam 1 MG 24 hr tablet  Commonly known as:  XANAX XR  Take 3 mg by mouth daily.     amoxicillin-clavulanate 875-125 MG tablet  Commonly known as:  AUGMENTIN  Take 1 tablet by mouth 2 (two) times daily.     atorvastatin 10 MG tablet  Commonly known as:  LIPITOR  Take 10 mg by mouth at bedtime.     chlorpheniramine-HYDROcodone 10-8 MG/5ML Suer  Commonly known as:  TUSSIONEX PENNKINETIC ER  Take 5 mLs by mouth at bedtime as needed for cough.     DESCOVY 200-25 MG tablet  Generic drug:  emtricitabine-tenofovir AF  Take 1 tablet by mouth daily.     enalapril-hydrochlorothiazide 10-25 MG tablet  Commonly known as:  VASERETIC  Take 1 tablet by mouth daily.     enoxaparin 120 MG/0.8ML injection  Commonly known as:  LOVENOX  Inject 0.8 mLs (120 mg total) into the skin daily. Stop 2 days after INR reaches 2.5     esomeprazole 20 MG capsule  Commonly known as:  NEXIUM  Take 20 mg by mouth daily.     omega-3 acid ethyl esters 1 G capsule  Commonly known as:  LOVAZA  Take 1 g by mouth 2 (two) times daily.     PREZCOBIX 800-150 MG tablet  Generic drug:  darunavir-cobicistat  Take 1 tablet by mouth daily. Swallow whole. Do NOT crush, break or chew tablets. Take with food.     TIVICAY 50 MG tablet  Generic drug:  dolutegravir  Take 50 mg by mouth daily.     valACYclovir 500 MG tablet  Commonly known as:  VALTREX  Take 500 mg by mouth daily.     warfarin 7.5 MG tablet  Commonly known as:  COUMADIN  Alternate 7.5mg  (1 tablet) and 11.25mg  (1 and a half tablets) every other day        Major procedures and Radiology Reports - PLEASE review detailed and final reports for all details,  in brief -     No results found.  Micro Results     No results found for this or any previous visit (from the past 240 hour(s)).  Today   Subjective    Michael [name] today has no headache,no chest abdominal pain,no new weakness tingling or numbness, feels much better wants to  go home today.     Objective   Blood pressure 117/65, pulse 61, temperature 98.1 F (36.7 C), temperature source Oral, resp. rate 16, height 5\' 6"  (1.676 m), weight 79.8 kg (175 lb 14.8 oz), SpO2 96 %.   Intake/Output Summary (Last 24 hours) at 10/02/15 1120 Last data filed at 10/02/15 0645  Gross per 24 hour  Intake 1121.25 ml  Output   2050 ml  Net -928.75 ml    Exam Awake Alert, Oriented x 3, No new F.N deficits, Normal affect [place].AT,PERRAL Supple Neck,No JVD, No cervical lymphadenopathy appriciated.  Symmetrical Chest wall movement, Good air movement bilaterally, CTAB RRR,No Gallops,Rubs or new Murmurs, No Parasternal Heave +ve B.Sounds, Abd Soft, Non tender, No organomegaly appriciated, No rebound -guarding or rigidity. No Cyanosis, Clubbing or edema, No new Rash or bruise, R foot has a bandage over the heel, r leg is minimally swollen   Data Review   CBC w Diff: Lab Results  Component Value Date   WBC 7.0 10/02/2015   HGB 14.1 10/02/2015   HCT 39.4 10/02/2015   PLT 126* 10/02/2015   LYMPHOPCT 37 10/01/2015   MONOPCT 6 10/01/2015   EOSPCT 2 10/01/2015   BASOPCT 0 10/01/2015    CMP: Lab Results  Component Value Date   NA 139 10/02/2015   K 3.1* 10/02/2015   CL 101 10/02/2015   CO2 30 10/02/2015   BUN 8 10/02/2015   CREATININE 1.05 10/02/2015   PROT 6.7 02/02/2014   ALBUMIN 2.5* 02/02/2014   BILITOT 0.4 02/02/2014   ALKPHOS 62 02/02/2014   AST 20 02/02/2014   ALT 48 02/02/2014  .   Total Time in preparing paper work, data evaluation and todays exam - 35 minutes  Thurnell Lose M.D on 10/02/2015 at 11:20 AM  [facility]   Office  [phone]

## 2015-10-02 NOTE — Telephone Encounter (Signed)
Stopping if INR>/= 2.5 for 48 hours is OK.  I did tell the patient I wanted him to take the lovenox for 10 days this AM. Dose of coumadin recommended was 7.5 alternate with 11.25 ( One 7.5 mg tab alternate with 1 1/2 tabs)

## 2015-10-02 NOTE — Telephone Encounter (Signed)
Dr Maudie Mercury will call pt and discuss directions.

## 2015-10-02 NOTE — Progress Notes (Signed)
ANTICOAGULATION CONSULT NOTE - Initial Consult  Pharmacy Consult for Enoxaparin and Coumadin Indication: DVT  Allergies  Allergen Reactions  . Efavirenz Rash    Patient Measurements: Height: 5\' 6"  (167.6 cm) Weight: 175 lb 14.8 oz (79.8 kg) IBW/kg (Calculated) : 63.8  Vital Signs: Temp: 98.1 F (36.7 C) (12/27 0508) Temp Source: Oral (12/27 0508) BP: 117/65 mmHg (12/27 0508) Pulse Rate: 61 (12/27 0508)  Labs:  Recent Labs  10/01/15 1014 10/01/15 1537 10/01/15 1926 10/02/15 0715  HGB 15.2  --   --  14.1  HCT 42.3  --   --  39.4  PLT 138*  --   --  126*  LABPROT 24.4*  --   --   --   INR 2.22*  --   --   --   CREATININE 1.08  --   --  1.05  TROPONINI  --  <0.03 <0.03  --     Estimated Creatinine Clearance: 73.4 mL/min (by C-G formula based on Cr of 1.05).   Medical History: Past Medical History  Diagnosis Date  . HTN (hypertension)   . Hyperlipidemia   . HIV (human immunodeficiency virus infection) ([place])   . GERD (gastroesophageal reflux disease)   . Pulmonary embolism ([place]) 12/2013  . DVT (deep venous thrombosis) ([place]) 07/2015; 10/01/2015    RLE; RLE  . 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 Childhood asthma   . Pneumonia ~ 2000  . Hepatitis B   . Anxiety     Assessment: 59 yo M presents on 12/26 with RLE swelling. Doppler showed acute DVT in RLE. Has been on coumadin at home for history of PE. Had a subtherapeutic INR of 1.7 recently in which his dose was increased. INR on admit therapeutic at 2.2 but may have gotten clot when subtherapeutic.  Reports compliance with coumadin. MD would like to hold for now. May need to be considered for coumadin failure and switch oral agents. After discussion with MD, have decided to stay with coumadin due to issues with HIV meds. Will change dose to 11.25mg  and 7.5mg  on alternating days. Hematology consulted and would like to target an INR of 2.5-3.5. Will also change enoxaparin to 1.5mg /kg dosing for bridge.  Goal of  Therapy:  INR of 2.5-3.5 per Heme note (12/27) Monitor platelets by anticoagulation protocol: Yes   Plan:  Start enoxaparin 120mg  (1.5mg /kg) [place] Q24 tonight Alternate coumadin 11.25mg  (1 and a half tablets) and 7.5mg  (1 tablet) daily Monitor daily INR, CBC, s/s of bleed  Elenor Quinones, PharmD, [facility] Clinical Pharmacist Pager [phone] 10/02/2015 10:39 AM

## 2015-10-02 NOTE — Progress Notes (Signed)
NURSING PROGRESS NOTE  TRUETT LIEFER [id number] Discharge Data: 10/02/2015 12:05 PM Attending Provider: Waldemar Dickens, MD [id number], MD     Nickie Retort to be D/C'd Home per MD order.  Discussed with the patient the After Visit Summary and all questions fully answered. All IV's discontinued with no bleeding noted. All belongings returned to patient for patient to take home.   Last Vital Signs:  Blood pressure 117/65, pulse 61, temperature 98.1 F (36.7 C), temperature source Oral, resp. rate 16, height 5\' 6"  (1.676 m), weight 79.8 kg (175 lb 14.8 oz), SpO2 96 %.  Discharge Medication List   Medication List    TAKE these medications        ALPRAZolam 1 MG 24 hr tablet  Commonly known as:  XANAX XR  Take 3 mg by mouth daily.     amoxicillin-clavulanate 875-125 MG tablet  Commonly known as:  AUGMENTIN  Take 1 tablet by mouth 2 (two) times daily.     atorvastatin 10 MG tablet  Commonly known as:  LIPITOR  Take 10 mg by mouth at bedtime.     chlorpheniramine-HYDROcodone 10-8 MG/5ML Suer  Commonly known as:  TUSSIONEX PENNKINETIC ER  Take 5 mLs by mouth at bedtime as needed for cough.     DESCOVY 200-25 MG tablet  Generic drug:  emtricitabine-tenofovir AF  Take 1 tablet by mouth daily.     enalapril-hydrochlorothiazide 10-25 MG tablet  Commonly known as:  VASERETIC  Take 1 tablet by mouth daily.     enoxaparin 120 MG/0.8ML injection  Commonly known as:  LOVENOX  Inject 0.8 mLs (120 mg total) into the skin daily. Stop 2 days after INR reaches 2.5     esomeprazole 20 MG capsule  Commonly known as:  NEXIUM  Take 20 mg by mouth daily.     omega-3 acid ethyl esters 1 G capsule  Commonly known as:  LOVAZA  Take 1 g by mouth 2 (two) times daily.     PREZCOBIX 800-150 MG tablet  Generic drug:  darunavir-cobicistat  Take 1 tablet by mouth daily. Swallow whole. Do NOT crush, break or chew tablets. Take with food.     TIVICAY 50 MG tablet  Generic drug:   dolutegravir  Take 50 mg by mouth daily.     valACYclovir 500 MG tablet  Commonly known as:  VALTREX  Take 500 mg by mouth daily.     warfarin 7.5 MG tablet  Commonly known as:  COUMADIN  Alternate 7.5mg  (1 tablet) and 11.25mg  (1 and a half tablets) every other day         Charolette Child, RN

## 2015-10-02 NOTE — Telephone Encounter (Signed)
Pt requesting the nurse to call back regarding coumadin and lovenox. He want clarification before taking the med. Please call pt back.

## 2015-10-03 ENCOUNTER — Telehealth: Payer: Self-pay | Admitting: Internal Medicine

## 2015-10-03 ENCOUNTER — Telehealth: Payer: Self-pay | Admitting: Pharmacist

## 2015-10-03 ENCOUNTER — Ambulatory Visit (INDEPENDENT_AMBULATORY_CARE_PROVIDER_SITE_OTHER): Payer: Managed Care, Other (non HMO) | Admitting: Pharmacist

## 2015-10-03 DIAGNOSIS — Z5181 Encounter for therapeutic drug level monitoring: Secondary | ICD-10-CM

## 2015-10-03 DIAGNOSIS — I2699 Other pulmonary embolism without acute cor pulmonale: Secondary | ICD-10-CM

## 2015-10-03 LAB — HIV-1 RNA QUANT-NO REFLEX-BLD
HIV 1 RNA Quant: <20 copies/mL
LOG10 HIV-1 RNA: UNDETERMINED {Log_copies}/mL

## 2015-10-03 LAB — POCT INR: INR: 1.4

## 2015-10-03 NOTE — Telephone Encounter (Signed)
Thank you for your reply. I spoke to patient to explain thought process of striving for overlap therapy (warfarin + enoxaparin) x 10 days if possible considering his VTE history. Patient states he has an appointment today for INR monitoring.

## 2015-10-03 NOTE — Telephone Encounter (Signed)
-----   Message from Annia Belt, MD sent at 10/03/2015  2:19 PM EST ----- Thanks. New target 2.5-3.5. I would like coumadin therapeutic for 48 hours before stopping lovenox. DrG ----- Message -----    From: Leeroy Bock, [place]    Sent: 10/03/2015   2:12 PM      To: Annia Belt, MD  FYI - pt seen in Coumadin Clinic today. Pt mentioned that he thought his INR goal should be 3-3.5 rather than 2.5-3.5, wanted to clarify with you. Also saw that 10 days of Lovenox was recommended. We will see pt back on Tuesday, 1/3 in clinic. If pt is therapeutic then, are you ok with 1-2 days of further Lovenox overlap? Wanted to avoid therapeutic INR + therapeutic Lovenox for 4 days. Thanks, [employer]

## 2015-10-03 NOTE — Telephone Encounter (Signed)
The coumadin clinic will advise him on how to take the lovenox and coumadin

## 2015-10-03 NOTE — Telephone Encounter (Signed)
Called and spoke with pt. Informed him of MW's recs. Pt has ov with coumadin clinic today at 1:30pm. Pt voiced understanding and had no further questions.

## 2015-10-03 NOTE — Telephone Encounter (Signed)
Pt seen in ED 10/01/15 for DVT Two new blood clots in leg and hip.  Pt states that he was told her needs to be on both Coumadin and Lovenox(x 10 days) Appt 10/05/15 with PCP to follow up from hospital Pt is having PT/INR checked today at Coumadin Clinic @1 :15 Pt wants to know if any rec's can be offered on what is needed at this time.   Pt is wanting to know if his ED visit can be reviewed or if he can be seen to address this issue so that he can make sure that he is doing what he is supposed to be doing so that the clots do not worsen or cause further issues. Please advise Dr Melvyn Novas as Dr Chase Caller is not available at this time. Summary of 10/01/15 Venous Doppler below. Thanks.   Summary: - Findings consistent with acute deep vein thrombosis involving the  right femoral vein and right gastrocnemius vein. - No evidence of deep vein thrombosis involving the left lower  extremity. - No evidence of Baker&'s cyst on the right or left.  Pt requests that we call him back on his cell phone [phone])

## 2015-10-04 ENCOUNTER — Ambulatory Visit
Admission: RE | Admit: 2015-10-04 | Discharge: 2015-10-04 | Disposition: A | Discharge status: Home / Self Care | Payer: Self-pay | Source: Ambulatory Visit | Attending: Family Medicine | Admitting: Family Medicine

## 2015-10-04 ENCOUNTER — Other Ambulatory Visit: Payer: Self-pay | Admitting: Family Medicine

## 2015-10-04 DIAGNOSIS — I82401 Acute embolism and thrombosis of unspecified deep veins of right lower extremity: Secondary | ICD-10-CM

## 2015-10-09 ENCOUNTER — Ambulatory Visit (INDEPENDENT_AMBULATORY_CARE_PROVIDER_SITE_OTHER): Payer: Managed Care, Other (non HMO) | Admitting: Pharmacist

## 2015-10-09 DIAGNOSIS — Z5181 Encounter for therapeutic drug level monitoring: Secondary | ICD-10-CM | POA: Diagnosis not present

## 2015-10-09 DIAGNOSIS — I2699 Other pulmonary embolism without acute cor pulmonale: Secondary | ICD-10-CM | POA: Diagnosis not present

## 2015-10-09 LAB — POCT INR: INR: 1.7

## 2015-10-12 ENCOUNTER — Ambulatory Visit (INDEPENDENT_AMBULATORY_CARE_PROVIDER_SITE_OTHER): Payer: Managed Care, Other (non HMO) | Admitting: Pharmacist

## 2015-10-12 DIAGNOSIS — Z5181 Encounter for therapeutic drug level monitoring: Secondary | ICD-10-CM

## 2015-10-12 DIAGNOSIS — I2699 Other pulmonary embolism without acute cor pulmonale: Secondary | ICD-10-CM

## 2015-10-12 LAB — POCT INR: INR: 2.3

## 2015-10-12 MED ORDER — ENOXAPARIN SODIUM 120 MG/0.8ML ~~LOC~~ SOLN: 1.5000 mg/kg | SUBCUTANEOUS | Status: DC

## 2015-10-15 ENCOUNTER — Ambulatory Visit (INDEPENDENT_AMBULATORY_CARE_PROVIDER_SITE_OTHER): Payer: Managed Care, Other (non HMO) | Admitting: Pharmacist

## 2015-10-15 ENCOUNTER — Other Ambulatory Visit: Payer: Self-pay | Admitting: Pharmacist

## 2015-10-15 ENCOUNTER — Ambulatory Visit: Payer: Managed Care, Other (non HMO) | Admitting: Cardiovascular Disease

## 2015-10-15 ENCOUNTER — Other Ambulatory Visit: Payer: Self-pay | Admitting: Cardiovascular Disease

## 2015-10-15 DIAGNOSIS — I82411 Acute embolism and thrombosis of right femoral vein: Secondary | ICD-10-CM

## 2015-10-15 DIAGNOSIS — I82402 Acute embolism and thrombosis of unspecified deep veins of left lower extremity: Secondary | ICD-10-CM

## 2015-10-15 DIAGNOSIS — I2699 Other pulmonary embolism without acute cor pulmonale: Secondary | ICD-10-CM

## 2015-10-15 DIAGNOSIS — Z5181 Encounter for therapeutic drug level monitoring: Secondary | ICD-10-CM

## 2015-10-15 DIAGNOSIS — I2692 Saddle embolus of pulmonary artery without acute cor pulmonale: Secondary | ICD-10-CM

## 2015-10-15 LAB — POCT INR: INR: 2.8

## 2015-10-16 ENCOUNTER — Encounter: Payer: Self-pay | Admitting: Internal Medicine

## 2015-10-16 ENCOUNTER — Ambulatory Visit (INDEPENDENT_AMBULATORY_CARE_PROVIDER_SITE_OTHER): Payer: Managed Care, Other (non HMO) | Admitting: Internal Medicine

## 2015-10-16 ENCOUNTER — Encounter (HOSPITAL_COMMUNITY): Payer: Managed Care, Other (non HMO)

## 2015-10-16 ENCOUNTER — Ambulatory Visit: Payer: Managed Care, Other (non HMO) | Admitting: Internal Medicine

## 2015-10-16 VITALS — BP 144/82 | HR 70 | Ht 66.0 in | Wt 175.8 lb

## 2015-10-16 DIAGNOSIS — I2699 Other pulmonary embolism without acute cor pulmonale: Secondary | ICD-10-CM | POA: Diagnosis not present

## 2015-10-16 DIAGNOSIS — J018 Other acute sinusitis: Secondary | ICD-10-CM

## 2015-10-16 MED ORDER — LEVOFLOXACIN 500 MG PO TABS: 500.0000 mg | ORAL_TABLET | Freq: Every day | ORAL | Status: DC

## 2015-10-16 NOTE — Patient Instructions (Signed)
ICD-9-CM ICD-10-CM   1. Other acute pulmonary embolism (HCC)  I26.99   2. Other acute sinusitis 461.8 J01.80     Agree wiuth 3nd opinion for PE at [facility]  - ask if ok for air travel to [place] this next week   - Discussed with them about potential etiologies and risk factors  - Please discuss with them about target INR   - Please discuss with them about adding aspirin therapy  Re: Acute sinusitis  - This seems to be on the way out. I recommend observation.  - If cough persists for beyond 4 weeks then we should consider stopping enalapril  - If sinusitis recurs please take Levaquin 500 mg once daily for 7 days (he can take a prescription now for future use)  Follow-up   - 6 months or sooner if needed

## 2015-10-16 NOTE — Progress Notes (Signed)
Subjective:     Patient ID: Michael [name], male   DOB: [birth year], 60 y.o.   MRN: [id number]  HPI   60 year old male with known history of hypertension, HIV and previous pulmonary emboli. Patient was recently admitted October 22 and found to have an acute PE.  08/20/2015 post hospital follow-up Presents for a post hospital follow-up Patient was recently admitted for acute PE and DVT Patient had recently had a road trip to [place] and has been in a cast for plantar fasciitis. He presented with shortness of breath. CT chest showed a saddle pulmonary embolism Venous Doppler was positive for a right DVT 2-D echo showed an EF of [id number], grade 1 diastolic dysfunction Factor V Leiden, prothrombin gene mutation,  was negative. Anti-thrombin 3 and protein S were normal. Patient had a previous PE in 2015 after shoulder surgery Patient was started on Lovenox and Coumadin INR on November 7 Was 2.2.  We discussed this is his second clot. There is discussion that he may need  Lifelong even though they were both considered provoked.   Previously seen by Hematology in 11/2014  With Dr. Beryle Beams.  Repeat lupus anticoagulant was neg.   Still gets winded and worn out with light activity. No chest pain or syncope.  No bleeding . Following with coumadin clinic.  Plans to return to work on 09/17/15 .    OV 10/16/2015  Chief Complaint  Patient presents with  . Follow-up    [facility] pt. Pt states he currently has a cold, pt c/o dry cough. Pt denies SOB and CP/tightness.    60 year old male. First pulmonary embolism  and was in 2015 following shoulder surgery. Second pulmonary embolism was October 2016 associated with right lower extremity DVT in October 2016. Then he reports that in 10/01/2015 he had another DVT on the right side. This was a fresh different DVT. It was related to ankle issues. His target INR now by Dr. Darnell Level is between 2.5 and 3.5. He is frustrated by the recurrence. He is frustrated  and no etiology has been found. He has a follow-up clinic for second opinion at [facility] hematology Department. He is planning a trip to [place] he wants to know if he can go. He says he cannot be on new or anticoagulants because of his HIV medications  Other than that he is feeling fine. No dyspnea and no chest pain or palpitations and no syncope no recurrent leg edema no hemoptysis. Tolerating anticoagulation fine. He did pick of sinusitis a week ago now is left with residual sinus drainage and a mild dry cough. He is noted to be on ACE inhibitor. He wants an empiric antibiotic therapy to take in case he needs it while in the remote parts of [place]    \\ Current outpatient prescriptions:  .  ALPRAZolam (XANAX XR) 1 MG 24 hr tablet, Take 3 mg by mouth daily., Disp: , Rfl:  .  atorvastatin (LIPITOR) 10 MG tablet, Take 10 mg by mouth at bedtime. , Disp: , Rfl:  .  chlorpheniramine-HYDROcodone (TUSSIONEX PENNKINETIC ER) 10-8 MG/5ML SUER, Take 5 mLs by mouth at bedtime as needed for cough., Disp: 100 mL, Rfl: 0 .  darunavir-cobicistat (PREZCOBIX) 800-150 MG per tablet, Take 1 tablet by mouth daily. Swallow whole. Do NOT crush, break or chew tablets. Take with food., Disp: , Rfl:  .  dolutegravir (TIVICAY) 50 MG tablet, Take 50 mg by mouth daily., Disp: , Rfl:  .  emtricitabine-tenofovir AF (DESCOVY) 200-25  MG tablet, Take 1 tablet by mouth daily., Disp: , Rfl:  .  enalapril-hydrochlorothiazide (VASERETIC) 10-25 MG per tablet, Take 1 tablet by mouth daily., Disp: , Rfl:  .  enoxaparin (LOVENOX) 120 MG/0.8ML injection, Inject 0.8 mLs (120 mg total) into the skin daily. Stop 2 days after INR reaches 2.5, Disp: 5 Syringe, Rfl: 0 .  esomeprazole (NEXIUM) 20 MG capsule, Take 20 mg by mouth daily., Disp: , Rfl:  .  omega-3 acid ethyl esters (LOVAZA) 1 G capsule, Take 1 g by mouth 2 (two) times daily., Disp: , Rfl:  .  valACYclovir (VALTREX) 500 MG tablet, Take 500 mg by mouth daily. , Disp: , Rfl:  .   warfarin (COUMADIN) 7.5 MG tablet, Alternate 7.5mg  (1 tablet) and 11.25mg  (1 and a half tablets) every other day, Disp: 60 tablet, Rfl: 0  Allergies  Allergen Reactions  . Efavirenz Rash    Immunization History  Administered Date(s) Administered  . Influenza Split 08/06/2013  . Influenza Whole 07/03/2009, 06/14/2010  . Influenza,inj,Quad PF,36+ Mos 06/07/2015  . Influenza-Unspecified 06/06/2014  . Pneumococcal Conjugate-13 11/06/2013       Review of Systems PER HPI     Objective:   Physical Exam  Constitutional: He is oriented to person, place, and time. He appears well-developed and well-nourished. No distress.  HENT:  Head: Normocephalic and atraumatic.  Right Ear: External ear normal.  Left Ear: External ear normal.  Mouth/Throat: Oropharynx is clear and moist. No oropharyngeal exudate.  SOUNDS NASALLLY  Eyes: Conjunctivae and EOM are normal. Pupils are equal, round, and reactive to light. Right eye exhibits no discharge. Left eye exhibits no discharge. No scleral icterus.  Neck: Normal range of motion. Neck supple. No JVD present. No tracheal deviation present. No thyromegaly present.  Cardiovascular: Normal rate, regular rhythm and intact distal pulses.  Exam reveals no gallop and no friction rub.   No murmur heard. Pulmonary/Chest: Effort normal and breath sounds normal. No respiratory distress. He has no wheezes. He has no rales. He exhibits no tenderness.  Abdominal: Soft. Bowel sounds are normal. He exhibits no distension and no mass. There is no tenderness. There is no rebound and no guarding.  Musculoskeletal: Normal range of motion. He exhibits no edema or tenderness.  Lymphadenopathy:    He has no cervical adenopathy.  Neurological: He is alert and oriented to person, place, and time. He has normal reflexes. No cranial nerve deficit. Coordination normal.  Skin: Skin is warm and dry. No rash noted. He is not diaphoretic. No erythema. No pallor.  Psychiatric: He  has a normal mood and affect. His behavior is normal. Judgment and thought content normal.  Nursing note and vitals reviewed.   Filed Vitals:   10/16/15 1020  BP: 144/82  Pulse: 70  Height: 5\' 6"  (1.676 m)  Weight: 175 lb 12.8 oz (79.742 kg)  SpO2: 98%        Assessment:       ICD-9-CM ICD-10-CM   1. Other acute pulmonary embolism (HCC)  I26.99   2. Other acute sinusitis 461.8 J01.80        Plan:      Agree wiuth 3nd opinion for PE at [facility]  - ask if ok for air travel to [place] this next week   - Discussed with them about potential etiologies and risk factors  - Please discuss with them about target INR   - Please discuss with them about adding aspirin therapy  Re: Acute sinusitis  - This  seems to be on the way out. I recommend observation.  - If cough persists for beyond 4 weeks then we should consider stopping enalapril  - If sinusitis recurs please take Levaquin 500 mg once daily for 7 days (he can take a prescription now for future use)  Follow-up   - 6 months or sooner if needed   > 50% of this > 25 min visit spent in face to face counseling or coordination of care   Dr. Brand Males, M.D., [facility].C.P Pulmonary and Critical Care Medicine Staff Physician [place] [facility] Pager: [phone], If no answer or between  15:00h - 7:00h: call [phone]  10/16/2015 10:58 AM

## 2015-10-17 ENCOUNTER — Ambulatory Visit: Payer: Managed Care, Other (non HMO) | Admitting: Cardiovascular Disease

## 2015-10-19 ENCOUNTER — Ambulatory Visit (INDEPENDENT_AMBULATORY_CARE_PROVIDER_SITE_OTHER): Payer: Managed Care, Other (non HMO) | Admitting: Pharmacist

## 2015-10-19 DIAGNOSIS — Z5181 Encounter for therapeutic drug level monitoring: Secondary | ICD-10-CM

## 2015-10-19 DIAGNOSIS — I2699 Other pulmonary embolism without acute cor pulmonale: Secondary | ICD-10-CM

## 2015-10-19 LAB — POCT INR: INR: 5

## 2015-11-01 ENCOUNTER — Telehealth: Payer: Self-pay

## 2015-11-01 ENCOUNTER — Ambulatory Visit: Payer: Managed Care, Other (non HMO) | Admitting: Cardiovascular Disease

## 2015-11-01 ENCOUNTER — Ambulatory Visit (INDEPENDENT_AMBULATORY_CARE_PROVIDER_SITE_OTHER): Payer: Managed Care, Other (non HMO) | Admitting: Pharmacist

## 2015-11-01 DIAGNOSIS — Z5181 Encounter for therapeutic drug level monitoring: Secondary | ICD-10-CM

## 2015-11-01 DIAGNOSIS — I2699 Other pulmonary embolism without acute cor pulmonale: Secondary | ICD-10-CM

## 2015-11-01 LAB — PROTIME-INR
INR: 8.04 — AB (ref 0.00–1.49)
Prothrombin Time: 64.2 seconds — ABNORMAL HIGH (ref 11.6–15.2)

## 2015-11-01 LAB — POCT INR: INR: >8

## 2015-11-01 NOTE — Telephone Encounter (Signed)
Coumadin clinic has already responded to these results- Their note reviewed in Epic for 11/01/15

## 2015-11-01 NOTE — Telephone Encounter (Signed)
Spoke with Maudie Mercury at [facility], calling for a critical lab report: PT-64.2 INR-8.04  Labs available in Epic.   Sending to DOD as MR is on vacation.   CY please advise on recs.  Thanks.

## 2015-11-05 ENCOUNTER — Ambulatory Visit (INDEPENDENT_AMBULATORY_CARE_PROVIDER_SITE_OTHER): Payer: Managed Care, Other (non HMO) | Admitting: Pharmacist

## 2015-11-05 DIAGNOSIS — I2699 Other pulmonary embolism without acute cor pulmonale: Secondary | ICD-10-CM

## 2015-11-05 DIAGNOSIS — Z5181 Encounter for therapeutic drug level monitoring: Secondary | ICD-10-CM

## 2015-11-05 LAB — POCT INR: INR: 1.2

## 2015-11-08 ENCOUNTER — Ambulatory Visit (INDEPENDENT_AMBULATORY_CARE_PROVIDER_SITE_OTHER): Payer: Managed Care, Other (non HMO) | Admitting: Allergy and Immunology

## 2015-11-08 ENCOUNTER — Encounter: Payer: Self-pay | Admitting: Allergy and Immunology

## 2015-11-08 VITALS — BP 128/82 | HR 78 | Temp 97.6°F | Resp 20 | Ht 65.35 in | Wt 173.0 lb

## 2015-11-08 DIAGNOSIS — R05 Cough: Secondary | ICD-10-CM | POA: Diagnosis not present

## 2015-11-08 DIAGNOSIS — J3089 Other allergic rhinitis: Secondary | ICD-10-CM

## 2015-11-08 DIAGNOSIS — R062 Wheezing: Secondary | ICD-10-CM

## 2015-11-08 DIAGNOSIS — R059 Cough, unspecified: Secondary | ICD-10-CM

## 2015-11-08 DIAGNOSIS — J309 Allergic rhinitis, unspecified: Secondary | ICD-10-CM | POA: Diagnosis not present

## 2015-11-08 MED ORDER — ALBUTEROL SULFATE HFA 108 (90 BASE) MCG/ACT IN AERS: 2.0000 | INHALATION_SPRAY | RESPIRATORY_TRACT | Status: DC | PRN

## 2015-11-08 MED ORDER — MONTELUKAST SODIUM 10 MG PO TABS: 10.0000 mg | ORAL_TABLET | Freq: Every day | ORAL | Status: DC

## 2015-11-08 MED ORDER — BECLOMETHASONE DIPROP MONOHYD 42 MCG/SPRAY NA SUSP: NASAL | Status: DC

## 2015-11-08 NOTE — Patient Instructions (Addendum)
Take Home Sheet  1. Avoidance: Mite   2. Antihistamine:  Claritin 10mg  by mouth once daily for runny nose or itching as needed.   3. Nasal Spray: Beconase 2 spray(s) each nostril once daily for stuffy nose or drainage.    4. Inhalers:  Rescue: ProAir 2 puffs every 4 hours as needed for cough or wheeze.       -May use 2 puffs 10-20 minutes prior to exercise.   Preventative: Pulmicort 164mcg 2 puffs once daily (Rinse, gargle, and spit out after use).  5. Nexium 20mg  twice daily for the next 2 weeks, then return to once daily.  6. Singulair 10mg  each evening.  7. Follow up Visit: 2 months or sooner if needed.   Websites that have reliable Patient information: 1. American Academy of Asthma, Allergy, & Immunology: www.aaaai.org 2. Food Allergy Network: www.foodallergy.org 3. Mothers of Asthmatics: www.aanma.org 4. [place]: DiningCalendar.de 5. American College of Allergy, Asthma, & Immunology: https://robertson.info/ or www.acaai.org

## 2015-11-08 NOTE — Progress Notes (Signed)
NEW PATIENT NOTE  RE: Michael [name] MRN: [id number] DOB: 10-12-1955 [facility] [address] [place] [address] Date of Office Visit: 11/08/2015   Subjective:  Michael [name] is a 60 y.o. male who presents today for Medication Refill  Assessment:   1. History of asthma, mild intermittent with recent cough afebrile in no respiratory distress.  2. Perennial allergic rhinitis.   3.      History of GE reflux with recent intermittent symptoms. 4.      Complex medical history on multiple medication regime. (including Prezcobix) Plan:   Meds ordered this encounter  Medications  . montelukast (SINGULAIR) 10 MG tablet    Sig: Take 1 tablet (10 mg total) by mouth at bedtime.    Dispense:  30 tablet    Refill:  3  . albuterol (PROAIR HFA) 108 (90 Base) MCG/ACT inhaler    Sig: Inhale 2 puffs into the lungs every 4 (four) hours as needed for wheezing or shortness of breath.    Dispense:  1 Inhaler    Refill:  3  . beclomethasone (BECONASE AQ) 42 MCG/SPRAY nasal spray    Sig: 1-2 sprays each nostril once daily.    Dispense:  25 g    Refill:  5   Patient Instructions  1. Avoidance: Mite 2. Antihistamine:  Claritin 10mg  by mouth once daily for runny nose or itching as needed. 3. Nasal Spray: Beconase 1 to 2 spray(s) each nostril once daily for stuffy nose or drainage.  4. Inhalers:  Rescue: ProAir 2 puffs every 4 hours as needed for cough or wheeze.       -May use 2 puffs 10-20 minutes prior to exercise.  Preventative: Pulmicort 14mcg 2 puffs once daily--sample only (Rinse, gargle, and spit out after use). 5. Nexium 20mg  twice daily for the next 2 weeks, then return to once daily. 6. Singulair 10mg  each evening. 7. Follow up Visit: 2 months or sooner if needed.  HPI: Michael [name], who has a complex past medical history on multiple medication regime presents to the office with a history of allergic rhinitis and mild asthma since childhood, last  evaluated in our office in 2012, mostly interested in receiving a new prescription for Beconase.  He reports over the last several years intermittent congestion, rhinorrhea and sneezing with intermittent postnasal drip.  Typically is well managed with nasal spray but on occasion there is rare cough with slight wheezing.  He denies difficulty breathing, shortness of breath or albuterol use. Completed a course of Levaquin last month for presumed sinusitis and great improvement.  Recently saw his primary care physician who restarted Singulair and added cough suppressant.  Denies discolored drainage, headache, fever, sore throat, itchy watery eyes, difficulty breathing or shortness of breath.  Generally describes outdoors, fluctuant weather patterns and dust as provoking factors for nasal symptoms and component of throat clearing associated with GE reflux.  Recent hospitalization with DVT (December) and DVT/PE (October) on daily anticoagulant.  No asthma hospitalizations, ED or Urgent care visits.  Medical History: Past Medical History  Diagnosis Date  . HTN (hypertension)   . Hyperlipidemia   . HIV (human immunodeficiency virus infection) ([place])   . GERD (gastroesophageal reflux disease)   . Pulmonary embolism ([place]) 12/2013  . DVT (deep venous thrombosis) ([place]) 07/2015; 10/01/2015    Michael [name]; Michael [name]  . 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 Childhood asthma   . Pneumonia ~ 2000  . Hepatitis B   .  Anxiety    Surgical History: Past Surgical History  Procedure Laterality Date  . Shoulder arthroscopy w/ rotator cuff repair Bilateral 2000's    "twice on right; once on the left"  . Anterior cervical decomp/discectomy fusion  2010    C40-C5  . Appendectomy    . Tonsillectomy    . Colonoscopy    . Shoulder arthroscopy with subacromial decompression Right 12/15/2013    Procedure: RIGHT SHOULDER ARTHROSCOPY WITH EXTENSIVE DEBRIDEMENT OF ROTATOR CUFF,SUBACROMIAL DECOMPRESSION, DEBRIDMENT OF BURSA;  Surgeon:  Ninetta Lights, MD;  Location: [facility];  Service: Orthopedics;  Laterality: Right;  . Back surgery     Family History: Family History  Problem Relation Age of Onset  . Diabetes Mother   . Heart attack Paternal Grandfather   . Deep vein thrombosis Father   . Allergic rhinitis Father   . Asthma Neg Hx   . Eczema Neg Hx   . Urticaria Neg Hx    Social History: Social History  . Marital Status: Married    Spouse Name: N/A  . Number of Children: N/A  . Years of Education: N/A   Occupational History  . 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   Social History Main Topics  . Smoking status: Never Smoker   . Smokeless tobacco: Never Used  . Alcohol Use: 0.0 oz/week    0 Standard drinks or equivalent per week     Comment: 10/01/2015 "might have a drink when I go out to dinner; very occasional"  . Drug Use: No  . Sexual Activity: Yes   Social History Narrative  Active with gym, swimming and traveling, recently to [place].  Michael [name] has a current medication list which includes the following prescription(s): alprazolam, atorvastatin, chlorpheniramine-hydrocodone, darunavir-cobicistat, dolutegravir, emtricitabine-tenofovir af, enalapril-hydrochlorothiazide, esomeprazole, fondaparinux, montelukast, omega-3 acid ethyl esters, valacyclovir.   Drug Allergies: Allergies  Allergen Reactions  . Efavirenz Rash   Environmental History: Michael [name] lives in a 60 year old remodeled house for 20 years wood floors, central air and heat without humidifier, or smokers.  Stuffed mattress non-feather pillow and comforter and cats in home.  Review of Systems  Constitutional: Negative for fever, weight loss and malaise/fatigue.  HENT: Positive for congestion. Negative for ear pain, hearing loss, nosebleeds and sore throat.   Eyes: Negative for discharge and redness.  Respiratory: Negative for shortness of breath.        Denies history of bronchitis and pneumonia.    Gastrointestinal: Positive for heartburn (On occasion this week, very rare.). Negative for nausea, vomiting, abdominal pain, diarrhea and constipation.  Genitourinary: Negative.   Musculoskeletal: Negative for myalgias and joint pain.  Skin: Negative.  Negative for itching and rash.  Neurological: Negative.  Negative for dizziness, seizures, weakness and headaches.  Endo/Heme/Allergies: Positive for environmental allergies.       Denies sensitivity to aspirin, NSAIDs, stinging insects, foods, latex, jewelry.   Objective:   Filed Vitals:   11/08/15 1450  BP: 128/82  Pulse: 78  Temp: 97.6 F (36.4 C)  Resp: 20   SpO2 Readings from Last 1 Encounters:  11/08/15 98%   Physical Exam  Constitutional: He is well-developed, well-nourished, and in no distress.  Alert interactive communicating of full sentences with occasional throat clearing/rare cough.  HENT:  Head: Atraumatic.  Right Ear: Tympanic membrane and ear canal normal.  Left Ear: Tympanic membrane and ear canal normal.  Nose: Mucosal edema (minimal.) present. No rhinorrhea. No epistaxis.  Mouth/Throat: Oropharynx is clear and moist and mucous membranes are normal.  No oropharyngeal exudate, posterior oropharyngeal edema or posterior oropharyngeal erythema.  Eyes: Conjunctivae are normal.  Neck: Neck supple.  Cardiovascular: Normal rate, S1 normal and S2 normal.   No murmur heard. Pulmonary/Chest: Effort normal and breath sounds normal. He has no wheezes. He has no rhonchi. He has no rales.  Post Xopenex neb continues to be clear without adventitious breath sounds.  Abdominal: Soft. Bowel sounds are normal.  Lymphadenopathy:    He has no cervical adenopathy.  Neurological: He is alert.  Skin: Skin is warm and intact. No rash noted. No cyanosis. Nails show no clubbing.   Diagnostics: Spirometry:  FVC  2.80--84%, FEV1  2.39--89%, postbronchodilator improvement FVC  3.27--98%, FEV1 2.61--97%.    Paco Cislo M. Ishmael Holter, MD    cc: Rachell Cipro, MD

## 2015-11-13 ENCOUNTER — Ambulatory Visit: Payer: Managed Care, Other (non HMO) | Admitting: Allergy and Immunology

## 2015-11-20 ENCOUNTER — Encounter: Payer: Self-pay | Admitting: Oncology

## 2015-11-20 ENCOUNTER — Ambulatory Visit (INDEPENDENT_AMBULATORY_CARE_PROVIDER_SITE_OTHER): Payer: Managed Care, Other (non HMO) | Admitting: Oncology

## 2015-11-20 VITALS — BP 131/72 | HR 66 | Temp 98.0°F | Ht 65.0 in | Wt 177.8 lb

## 2015-11-20 DIAGNOSIS — Z21 Asymptomatic human immunodeficiency virus [HIV] infection status: Secondary | ICD-10-CM | POA: Diagnosis not present

## 2015-11-20 DIAGNOSIS — I2782 Chronic pulmonary embolism: Secondary | ICD-10-CM | POA: Diagnosis not present

## 2015-11-20 DIAGNOSIS — I824Y1 Acute embolism and thrombosis of unspecified deep veins of right proximal lower extremity: Secondary | ICD-10-CM | POA: Diagnosis not present

## 2015-11-20 DIAGNOSIS — Z7901 Long term (current) use of anticoagulants: Secondary | ICD-10-CM

## 2015-11-20 DIAGNOSIS — Z8619 Personal history of other infectious and parasitic diseases: Secondary | ICD-10-CM | POA: Diagnosis not present

## 2015-11-20 DIAGNOSIS — I2699 Other pulmonary embolism without acute cor pulmonale: Secondary | ICD-10-CM

## 2015-11-20 DIAGNOSIS — I82411 Acute embolism and thrombosis of right femoral vein: Secondary | ICD-10-CM

## 2015-11-20 DIAGNOSIS — Z79899 Other long term (current) drug therapy: Secondary | ICD-10-CM

## 2015-11-20 NOTE — Patient Instructions (Addendum)
Return 6 months

## 2015-11-20 NOTE — Progress Notes (Signed)
Patient ID: Michael [name], male   DOB: Jul 15, 1956, 60 y.o.   MRN: [id number] Hematology and Oncology Follow Up Visit  OLLIVANDER LADUKE [id number] 07-04-1956 59 y.o. 11/20/2015 9:28 AM   Principle Diagnosis: Encounter Diagnoses  Name Primary?  . Acute deep vein thrombosis (DVT) of femoral vein of right lower extremity (HCC) Yes  . PE (pulmonary embolism)   Clinical Summary: First post hospital visit for this pleasant 60 year old HIV-positive man I recently saw in the hospital on 10/02/2015 when he was admitted for a presumed new venous thromboembolism in the right femoral and right gastrocnemius veins. He was already on Coumadin having suffered a large volume saddle pulmonary embolus in October 2016 likely related to chronic right plantar fascitis requiring prolonged boot immobilization and subsequent casting. Please see my consultation note dated 10/02/2015 for additional details. An acute DVT involving the right soleus vein documented at that time. 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 Venous Doppler study done 10/01/2015 read as consistent with acute DVT involving the right femoral and right gastrocnemius veins. Of note, INR had fallen into the subtherapeutic range earlier in December on December 12 (1.7) but was therapeutic at 2.2 on admission December 26. D-dimer was mildly elevated at 0.57 with lab normal up to 0.5.  His clotting history started with a provoked pulmonary embolus 2 weeks after right shoulder arthroplasty on 01/31/2014. He was anticoagulated for 6 months. Hyper coag panel unrevealing for congenital or acquired coagulopathy.  Although these events appeared to be provoked, they were life threatening with pulmonary emboli on 2 occasions and now extensive proximal DVT. I recommended long-term anticoagulation.  Initial Lovenox for 10 days. Target warfarin INR 2.5-3.5. He was not a candidate for target specific oral anticoagulants in view of known interaction with his antiretroviral medication.  CBC at time of pulmonary embolus in October 2016 with a platelet count of 107,000. Prior count of 338,000 in April 2015. At time of 10/01/2015 admission platelet count 138,000, repeat 126,000. Hemoglobin 15.2, white count 9200 with normal differential.   Interim History:    He got back on his Coumadin but had widely fluctuating INRs with some values over 8 with rapid fall into subtherapeutic range after holding the med for only 3 days. He sought advice from Dr. Danella Sensing, a hematologist at [facility], who recommended a trial of fondaparinux since the patient had no objection to daily injections. He is currently on a dose of 7.5 mg subcutaneous daily. He is tolerating this well. No abnormal bleeding or bruising. Specifically no gum bleeding, epistaxis, hematochezia, hematuria. His plantar fascitis has finally resolved. He denies any dyspnea, chest pain, or palpitations. Of note he had an upper respiratory illness at the time he was hospitalized in December with the new versus extension of right lower extremity DVT.  He tells me that his 8 year old father recently had his third DVT and is on 020 blood thinner. He has a history of bladder cancer treated at [facility]. He recently went on a fishing trip with his friends to [place]. 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  He is in a monogamous relationship and has married his partner. He is a never smoker.  Medications: reviewed in chart   Allergies:  Allergies  Allergen Reactions  . Efavirenz Rash    Review of Systems: See HPI  Remaining ROS negative:   Physical Exam: Blood pressure 131/72, pulse 66, temperature 98 F (36.7 C), temperature source Oral, height 5\' 5"  (1.651 m),  weight 177 lb 12.8 oz (80.65 kg), SpO2 99 %. Wt Readings from Last 3 Encounters:  11/20/15 177 lb 12.8 oz (80.65 kg)  11/08/15 173 lb (78.472 kg)  10/16/15 175 lb 12.8 oz (79.742 kg)     General appearance: well nourished Caucasian man HENNT: bilateral parotid gland enlargement.  Pharynx no erythema, exudate, mass, or ulcer. No thyromegaly or thyroid nodules Lymph nodes: No cervical, supraclavicular, or axillary lymphadenopathy Breasts:  Lungs: Clear to auscultation, resonant to percussion throughout Heart: Regular rhythm, no murmur, no gallop, no rub, no click, no edema Abdomen: Soft, nontender, normal bowel sounds, no mass, no organomegaly Extremities: No edema, no calf tenderness Calf measurements: Left 37.5 cm, right 38.5 cm Ankles: left 23.5 cm, right 23.5 cm Musculoskeletal: no joint deformities GU:  Vascular: Carotid pulses 2+, no bruits, Neurologic: Alert, oriented, PERRLA, optic discs sharp and vessels normal, no hemorrhage or exudate, cranial nerves grossly normal, motor strength 5 over 5, reflexes 1+ symmetric, upper body coordination normal, gait normal, Skin: No rash or ecchymosis  Lab Results: CBC W/Diff    Component Value Date/Time   WBC 7.0 10/02/2015 0715   RBC 4.09* 10/02/2015 0715   HGB 14.1 10/02/2015 0715   HCT 39.4 10/02/2015 0715   PLT 126* 10/02/2015 0715   MCV 96.3 10/02/2015 0715   MCH 34.5* 10/02/2015 0715   MCHC 35.8 10/02/2015 0715   RDW 12.7 10/02/2015 0715   LYMPHSABS 3.3 10/01/2015 1014   MONOABS 0.6 10/01/2015 1014   EOSABS 0.2 10/01/2015 1014   BASOSABS 0.0 10/01/2015 1014     Chemistry      Component Value Date/Time   NA 139 10/02/2015 0715   K 3.1* 10/02/2015 0715   CL 101 10/02/2015 0715   CO2 30 10/02/2015 0715   BUN 8 10/02/2015 0715   CREATININE 1.05 10/02/2015 0715      Component Value Date/Time   CALCIUM 8.8* 10/02/2015 0715   ALKPHOS 62 02/02/2014 0656   AST 20 02/02/2014 0656   ALT 48 02/02/2014 0656   BILITOT 0.4  02/02/2014 0656       Radiological Studies: No results found.  Impression:   #1. Provoked but recurrent pulmonary emboli and right proximal DVT He is now on Arixtra 7.5 mg subcutaneous daily.  We discussed again the need for longer-term anticoagulation with periodic re-evaluations. I'm going to repeat a CBC today to make sure that his platelet count has fully recovered otherwise I will see him again in 6 months.  After the patient left I noticed that he did not have a full chemistry profile in our system for the last 2 years. He did have some liver function abnormalities in the past. There is a history of hepatitis B infection. This may be influencing his sensitivity to Coumadin. I will ask our laboratory if they  have enough blood left from today's CBC to add a liver profile.  #2. HIV/AIDS Well-controlled on current regimen. Followed by Dr. Orvan Seen, infectious disease, [facility]    #3. History of hepatitis B infection  #4. Essential hypertension  #5. History of chronic right lplantar fasciitis.  #6. Hyperlipidemia on treatment  CC: Patient Care Team: Fanny Bien, MD as PCP - General (Family Medicine)   Alyson Locket  Beryle Beams, MD 2/14/20179:28 AM

## 2015-11-21 ENCOUNTER — Other Ambulatory Visit: Payer: Self-pay | Admitting: Oncology

## 2015-11-21 DIAGNOSIS — B20 Human immunodeficiency virus [HIV] disease: Secondary | ICD-10-CM

## 2015-11-21 DIAGNOSIS — Z7901 Long term (current) use of anticoagulants: Secondary | ICD-10-CM

## 2015-11-21 DIAGNOSIS — B181 Chronic viral hepatitis B without delta-agent: Secondary | ICD-10-CM

## 2015-11-21 LAB — CBC WITH DIFFERENTIAL/PLATELET
BASOS: 0 %
Basophils Absolute: 0 10*3/uL (ref 0.0–0.2)
EOS (ABSOLUTE): 0.1 10*3/uL (ref 0.0–0.4)
EOS: 1 %
HEMATOCRIT: 43.9 % (ref 37.5–51.0)
Hemoglobin: 15.2 g/dL (ref 12.6–17.7)
IMMATURE GRANS (ABS): 0 10*3/uL (ref 0.0–0.1)
IMMATURE GRANULOCYTES: 0 %
LYMPHS: 30 %
Lymphocytes Absolute: 2.8 10*3/uL (ref 0.7–3.1)
MCH: 33.5 pg — ABNORMAL HIGH (ref 26.6–33.0)
MCHC: 34.6 g/dL (ref 31.5–35.7)
MCV: 97 fL (ref 79–97)
Monocytes Absolute: 0.6 10*3/uL (ref 0.1–0.9)
Monocytes: 6 %
NEUTROS PCT: 63 %
Neutrophils Absolute: 5.7 10*3/uL (ref 1.4–7.0)
Platelets: 174 10*3/uL (ref 150–379)
RBC: 4.54 x10E6/uL (ref 4.14–5.80)
RDW: 13.8 % (ref 12.3–15.4)
WBC: 9.2 10*3/uL (ref 3.4–10.8)

## 2015-11-23 ENCOUNTER — Other Ambulatory Visit (INDEPENDENT_AMBULATORY_CARE_PROVIDER_SITE_OTHER): Payer: Managed Care, Other (non HMO)

## 2015-11-23 ENCOUNTER — Other Ambulatory Visit: Payer: Self-pay | Admitting: *Deleted

## 2015-11-23 ENCOUNTER — Telehealth: Payer: Self-pay | Admitting: *Deleted

## 2015-11-23 DIAGNOSIS — Z7901 Long term (current) use of anticoagulants: Secondary | ICD-10-CM

## 2015-11-23 DIAGNOSIS — B20 Human immunodeficiency virus [HIV] disease: Secondary | ICD-10-CM

## 2015-11-23 DIAGNOSIS — Z21 Asymptomatic human immunodeficiency virus [HIV] infection status: Secondary | ICD-10-CM

## 2015-11-23 DIAGNOSIS — B181 Chronic viral hepatitis B without delta-agent: Secondary | ICD-10-CM

## 2015-11-23 NOTE — Telephone Encounter (Signed)
Pt called / informed blood counts are normal and would like for him to come back ,at his convenience, to check liver chemistry which has not been done over a year per Dr Beryle Beams. Stated he will come Monday around 0900 AM.

## 2015-11-23 NOTE — Telephone Encounter (Signed)
-----   Message from Annia Belt, MD sent at 11/21/2015  8:22 AM EST ----- Call pt: regular blood count is normal. I would like him to come back at his convenience to check liver chemistry. This has not been done in over a year and may influence why he is sensitive to coumadin.

## 2015-11-23 NOTE — Addendum Note (Signed)
Addended by: Orson Gear on: 11/23/2015 04:11 PM   Modules accepted: Orders

## 2015-11-24 LAB — COMPREHENSIVE METABOLIC PANEL
ALBUMIN: 4.2 g/dL (ref 3.6–4.8)
ALT: 23 IU/L (ref 0–44)
AST: 21 IU/L (ref 0–40)
Albumin/Globulin Ratio: 2.1 (ref 1.1–2.5)
Alkaline Phosphatase: 41 IU/L (ref 39–117)
BUN / CREAT RATIO: 11 (ref 10–22)
BUN: 16 mg/dL (ref 8–27)
Bilirubin Total: 0.4 mg/dL (ref 0.0–1.2)
CALCIUM: 9.1 mg/dL (ref 8.6–10.2)
CO2: 26 mmol/L (ref 18–29)
CREATININE: 1.41 mg/dL — AB (ref 0.76–1.27)
Chloride: 97 mmol/L (ref 96–106)
GFR calc Af Amer: 62 mL/min/{1.73_m2} (ref >59)
GFR, EST NON AFRICAN AMERICAN: 53 mL/min/{1.73_m2} — AB (ref >59)
GLOBULIN, TOTAL: 2 g/dL (ref 1.5–4.5)
GLUCOSE: 83 mg/dL (ref 65–99)
Potassium: 3.7 mmol/L (ref 3.5–5.2)
SODIUM: 138 mmol/L (ref 134–144)
Total Protein: 6.2 g/dL (ref 6.0–8.5)

## 2015-11-26 ENCOUNTER — Other Ambulatory Visit: Payer: Managed Care, Other (non HMO)

## 2015-11-27 ENCOUNTER — Ambulatory Visit: Payer: Managed Care, Other (non HMO) | Admitting: Allergy and Immunology

## 2015-11-27 ENCOUNTER — Telehealth: Payer: Self-pay | Admitting: *Deleted

## 2015-11-27 NOTE — Telephone Encounter (Signed)
-----   Message from Annia Belt, MD sent at 11/26/2015  4:41 PM EST ----- Call pt: liver tests all normal but he appears to be a little dehydrated. He is on lisinopril/HCTZ: dose may need to be adjusted; we will forward results to his primary care MD

## 2015-11-27 NOTE — Telephone Encounter (Signed)
Pt called about his labs - stated he had already saw them on line. Informed liver tests normal but appears a little dehydrated (Crea 1.41) and Lisinopril/HCTZ may need to be adjusted by his PCP per Dr Beryle Beams. Stated his doctor had talked about changing his BP medication. Also stated he drinks plenty of fluids (water mainly). Labs faxed to Dr [facility] office @ [phone].

## 2015-11-28 ENCOUNTER — Telehealth: Payer: Self-pay | Admitting: Allergy and Immunology

## 2015-11-28 NOTE — Telephone Encounter (Signed)
Spoke with patient and informed him of decision per insurance company/pharmacy benefit.  As no intranasal steroid sprays are covered given multiple over-the-counter options.  Michael [name] will see his Infectious disease physician tomorrow and will review the option of an antihistamine nasal spray--Astepro or Patanase as recurring option, because intermittent other nasal sprays, use would unlikely be beneficial during upcoming season.  Michael [name] will call us if any new prescriptions necessary, throughout our office.

## 2015-11-28 NOTE — Telephone Encounter (Signed)
Received denial of coverage for Beconase nasal spray.  Phone call to appeals department for one to one physician appeal.  Transferred initially to pharmacy and requested only one to one.  Placed on hold by Ash for 10 minutes, for her to locate available physician.  She reports no physician currently available and she is able to schedule me a call back by Dr. Marcelyn Bruins at 430pm today.  I will await call back.  Received call back at 4:32pm from Dr. Maxwell Caul who states that Beconase is excluded from coverage---therefore only option is payment by patient or another formulary med.  I reviewed that patient takes Prezcobix  For management of HIV, which precluded use of other intranasal steroid sprays on recurring basis.  Dr. Maxwell Caul had no further options for me.  Will inform patient.

## 2015-12-06 ENCOUNTER — Telehealth: Payer: Self-pay | Admitting: *Deleted

## 2015-12-06 NOTE — Telephone Encounter (Signed)
I would advise him not to take his arixtra on the day before the procedure. He can resume at 9 AM the day after procedure.

## 2015-12-06 NOTE — Telephone Encounter (Signed)
Called pt - informed "advise him not to take his arixtra on the day before the procedure. He can resume at 9 AM the day after procedure" per Dr Beryle Beams. Pt voiced understanding and repeated instruction.

## 2015-12-06 NOTE — Telephone Encounter (Signed)
Call from pt - having a colonoscopy and is on Arixtra. Takes Arixtra at 0900 AM and colonoscopy is scheduled at [place]. Wants to know is there's an issue the time of day he gives himself the injection and having The colonoscopy? Thanks

## 2015-12-13 ENCOUNTER — Telehealth: Payer: Self-pay

## 2015-12-13 NOTE — Telephone Encounter (Signed)
Spoke to patient.  He has sore throat, fever, body aches, watery eyes, cough and congestion.  Advised to call PMD to rule out flu/strep throat.  Patient voices understanding.

## 2015-12-13 NOTE — Telephone Encounter (Signed)
Patient is having congestion, coughing and sinus issues. He is achy feeling and running a low grade fever around 99.5 to 100. He has been like this since yesterday morning.  Please Advise  [facility] Dr. Ishmael Holter

## 2016-01-08 ENCOUNTER — Ambulatory Visit (INDEPENDENT_AMBULATORY_CARE_PROVIDER_SITE_OTHER): Payer: Managed Care, Other (non HMO) | Admitting: Allergy and Immunology

## 2016-01-08 ENCOUNTER — Encounter: Payer: Self-pay | Admitting: Allergy and Immunology

## 2016-01-08 VITALS — BP 122/82 | HR 72 | Resp 16

## 2016-01-08 DIAGNOSIS — K219 Gastro-esophageal reflux disease without esophagitis: Secondary | ICD-10-CM

## 2016-01-08 DIAGNOSIS — H1010 Acute atopic conjunctivitis, unspecified eye: Secondary | ICD-10-CM

## 2016-01-08 DIAGNOSIS — J309 Allergic rhinitis, unspecified: Secondary | ICD-10-CM | POA: Diagnosis not present

## 2016-01-08 DIAGNOSIS — J4520 Mild intermittent asthma, uncomplicated: Secondary | ICD-10-CM

## 2016-01-08 MED ORDER — ESOMEPRAZOLE MAGNESIUM 40 MG PO CPDR: DELAYED_RELEASE_CAPSULE | ORAL | Status: DC

## 2016-01-08 NOTE — Progress Notes (Signed)
Follow-up Note  Referring Provider: Fanny Bien, MD Primary Provider: Rachell Cipro, MD Date of Office Visit: 01/08/2016  Subjective:   Michael [name] (DOB: 08-24-1956) is a 60 y.o. male who returns to the [facility] [place] on 01/08/2016 in re-evaluation of the following:  HPI Comments: Jerrin returns to this clinic in reevaluation of his allergic rhinoconjunctivitis and gastroesophageal reflux disease with LPR and mild intermittent asthma. I've not seen him in his clinic in quite a long period in time or if he seen Dr. Ishmael Holter on several occasions recently who has placed him on various medications in an attempt to get his atopic disease under better control. Would Tramone has noticed over the course of the past half year or so is that he is continued to have problems with some intermittent nasal congestion and also some drainage and on occasion develops an issue with slight cough and maybe some wheezing. When he increases his Nexium back up to 40 mg a day most of his wheezing and slight cough goes away. He still has some congestion in his nose. There is been some difficulty getting his Beconase nasal spray. Because of some of his HIV medication use there is some hesitation about using various nasal steroids. He recently was diagnosed with a pulmonary embolus and is on Lovenox and has discontinued any type of exercise routine over the course of the past several months. Now when he exercises he feels a little bit short of breath. Should be noted that he is attempting to restart his exercise program as though he has never stopped exercising for a prolonged period in time. He went to a spin class and immediately started to work as hard as he could as though he never had a hiatus from exercise and obviously developed some issues with shortness of breath. Fortunately, there was no chest pain and it was no associated wheezing and really no coughing.     Medication List            albuterol 108 (90 Base) MCG/ACT inhaler  Commonly known as:  PROAIR HFA  Inhale 2 puffs into the lungs every 4 (four) hours as needed for wheezing or shortness of breath.     ALPRAZolam 1 MG 24 hr tablet  Commonly known as:  XANAX XR  Take 3 mg by mouth daily.     ARIXTRA 7.5 MG/0.6ML Soln injection  Generic drug:  fondaparinux  Inject 7.5 mg into the skin daily.     atorvastatin 20 MG tablet  Commonly known as:  LIPITOR  Take 20 mg by mouth daily.     beclomethasone 42 MCG/SPRAY nasal spray  Commonly known as:  BECONASE AQ  1-2 sprays each nostril once daily.     DESCOVY 200-25 MG tablet  Generic drug:  emtricitabine-tenofovir AF  Take 1 tablet by mouth daily.     enalapril-hydrochlorothiazide 10-25 MG tablet  Commonly known as:  VASERETIC  Take 1 tablet by mouth daily.     esomeprazole 20 MG capsule  Commonly known as:  NEXIUM  Take 20 mg by mouth daily.     loratadine 10 MG tablet  Commonly known as:  CLARITIN  Take 10 mg by mouth daily.     montelukast 10 MG tablet  Commonly known as:  SINGULAIR  Take 1 tablet (10 mg total) by mouth at bedtime.     omega-3 acid ethyl esters 1 g capsule  Commonly known as:  LOVAZA  Take 1 g  by mouth 2 (two) times daily.     PREZCOBIX 800-150 MG tablet  Generic drug:  darunavir-cobicistat  Take 1 tablet by mouth daily. Swallow whole. Do NOT crush, break or chew tablets. Take with food.     TIVICAY 50 MG tablet  Generic drug:  dolutegravir  Take 50 mg by mouth daily.     valACYclovir 500 MG tablet  Commonly known as:  VALTREX  Take 500 mg by mouth daily.        Past Medical History  Diagnosis Date  . HTN (hypertension)   . Hyperlipidemia   . HIV (human immunodeficiency virus infection) ([place])   . GERD (gastroesophageal reflux disease)   . Pulmonary embolism ([place]) 12/2013  . DVT (deep venous thrombosis) ([place]) 07/2015; 10/01/2015    RLE; RLE  . 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 Childhood asthma   . Pneumonia  ~ 2000  . Hepatitis B   . Anxiety     Past Surgical History  Procedure Laterality Date  . Shoulder arthroscopy w/ rotator cuff repair Bilateral 2000's    "twice on right; once on the left"  . Anterior cervical decomp/discectomy fusion  2010    C40-C5  . Appendectomy    . Tonsillectomy    . Colonoscopy    . Shoulder arthroscopy with subacromial decompression Right 12/15/2013    Procedure: RIGHT SHOULDER ARTHROSCOPY WITH EXTENSIVE DEBRIDEMENT OF ROTATOR CUFF,SUBACROMIAL DECOMPRESSION, DEBRIDMENT OF BURSA;  Surgeon: Ninetta Lights, MD;  Location: [facility];  Service: Orthopedics;  Laterality: Right;  . Back surgery      Allergies  Allergen Reactions  . Efavirenz Rash    Review of systems negative except as noted in HPI / PMHx or noted below:  Review of Systems  Constitutional: Negative.   HENT: Negative.   Eyes: Negative.   Respiratory: Negative.   Cardiovascular: Negative.   Gastrointestinal: Negative.   Genitourinary: Negative.   Musculoskeletal: Negative.   Skin: Negative.   Neurological: Negative.   Endo/Heme/Allergies: Negative.   Psychiatric/Behavioral: Negative.      Objective:   Filed Vitals:   01/08/16 0815  BP: 122/82  Pulse: 72  Resp: 16          Physical Exam  Constitutional: He is well-developed, well-nourished, and in no distress.  HENT:  Head: Normocephalic.  Right Ear: Tympanic membrane, external ear and ear canal normal.  Left Ear: Tympanic membrane, external ear and ear canal normal.  Nose: Nose normal. No mucosal edema or rhinorrhea.  Mouth/Throat: Uvula is midline, oropharynx is clear and moist and mucous membranes are normal. No oropharyngeal exudate.  Eyes: Conjunctivae are normal.  Neck: Trachea normal. No tracheal tenderness present. No tracheal deviation present. No thyromegaly present.  Cardiovascular: Normal rate, regular rhythm, S1 normal, S2 normal and normal heart sounds.   No murmur heard. Pulmonary/Chest:  Breath sounds normal. No stridor. No respiratory distress. He has no wheezes. He has no rales.  Musculoskeletal: He exhibits no edema.  Lymphadenopathy:       Head (right side): No tonsillar adenopathy present.       Head (left side): No tonsillar adenopathy present.    He has no cervical adenopathy.  Neurological: He is alert. Gait normal.  Skin: No rash noted. He is not diaphoretic. No erythema. Nails show no clubbing.  Psychiatric: Mood and affect normal.    Diagnostics:    Spirometry was performed and demonstrated an FEV1 of 2.17 at 81 % of predicted.  The patient had  an Asthma Control Test with the following results:  .    Assessment and Plan:   1. Allergic rhinoconjunctivitis   2. Asthma, mild intermittent, well-controlled   3. Gastroesophageal reflux disease, esophagitis presence not specified      1. OTC Rhinocort one spray each nostril three times per week. Coupon.  2. Montelukast 10mg  one tablet one time per day  3. nexium 40 mg one tablet one time per day  4. If needed:   A. Proair HFA 2 puffs every 4-6 hours   B. OTC antihistamine  C. Nasal saline  5. Return in 6 months or earlier if needed.   I will have Basel utilize low-dose nasal budesonide which really should not cause any interaction with his HIV medications especially with such a low dose. He'll continue to use Nexium at a dose of 40 mg a day for when he goes down to 20 mg a day he certainly has some problems with reflux-induced respiratory disease. I had a talk with him today about how he needs to undergo progressive exercise routine starting off very slow and aiming to get up to a high aerobic capacity some time over the course the next 3-4 weeks rather than just attempting to restart his exercise at a very high level. I informed him that he was no longer 60 years old and needed to use a program that'll allow his body to adapt to the insult that exercise brings especially when restarting a program. If he  does well I will see him back in this clinic in 6 months or earlier if there is a problem.  Allena Katz, MD [place]

## 2016-01-08 NOTE — Patient Instructions (Addendum)
  1. OTC Rhinocort one spray each nostril three times per week. Coupon.  2. Montelukast 10mg  one tablet one time per day  3. nexium 40 mg one tablet one time per day  4. If needed:   A. Proair HFA 2 puffs every 4-6 hours   B. OTC antihistamine  C. Nasal saline  5. Return in 6 months or earlier if needed.

## 2016-01-09 ENCOUNTER — Telehealth: Payer: Self-pay | Admitting: *Deleted

## 2016-01-09 NOTE — Telephone Encounter (Signed)
Dr Beryle Beams, dr dahlstedt is planning a prostate biopsy 4/12, pt reported to them that he is on arixtra, dr dahl;stedt states he will need to be off 24hours. Could you please advise on this or you may call dr dahlstedt at [phone] please page him, pt will need to be cleared We can fax clearance and advisement to [phone] attn jennifer, jennifer ext [phone]

## 2016-01-09 NOTE — Telephone Encounter (Signed)
He is OK to hold arixtra for 24 hours. I can send a note to Dr D in [facility]; maybe you can call his RN Anderson Malta  thx [phone]

## 2016-01-10 NOTE — Telephone Encounter (Signed)
OK 

## 2016-01-10 NOTE — Telephone Encounter (Signed)
Dr g, they are not on EPIC, they need this in written form, thanks hla

## 2016-01-14 ENCOUNTER — Telehealth: Payer: Self-pay | Admitting: *Deleted

## 2016-01-14 NOTE — Telephone Encounter (Signed)
Returned pt's call - stated he called/talked to Dr Danella Sensing, [facility] hematologist, suggested he stopped Arixtra 3 days prior to his procedure and to restart it 24 hr afterward. His procedure is Wed @ 1315PM and he has not taken Arixtra today. He wants you to know and make sure this is ok.

## 2016-01-15 ENCOUNTER — Encounter: Payer: Self-pay | Admitting: Oncology

## 2016-01-15 NOTE — Progress Notes (Unsigned)
Patient ID: Michael [name], male   DOB: Dec 22, 1955, 60 y.o.   MRN: [id number] 20 minute phone conversation with [name]. He is scheduled for a prostate biopsy tomorrow. I already sent instructions to his urologist for him to hold his Arixtra for 24 hours prior to the procedure and then resume the next day. Overall his renal function has been normal with some fluctuations in his creatinine. Routine lab done February 17 showed a creatinine of 1.4 with lab normal up to 1.27. He had lab repeated at [facility] where he gets his HIV care and was told that his kidney function was perfectly normal. It is not clear where the communication lapsed. He called our office yesterday at 3:24 PM to report that he discussed his anticoagulant management with another hematologist at [facility] who suggested that he stop the Arixtra for 3 days prior to the procedure. He stopped taking the drug on April 9 for the procedure which is scheduled on the 12th. I tried to reassure him that this was perfectly acceptable and just a judgment call with respect to how long the Arixtra had to be held. It does have a long half life and it is influenced by kidney function. I also told him that in my patients who are at high risk for recurrent thrombotic events such as him, I try to keep anticoagulation tight around procedures and accept that there might be a small bleeding risk in order to prevent a recurrent clot.

## 2016-01-16 ENCOUNTER — Telehealth: Payer: Self-pay | Admitting: *Deleted

## 2016-01-16 MED ORDER — DEXLANSOPRAZOLE 60 MG PO CPDR: 60.0000 mg | DELAYED_RELEASE_CAPSULE | Freq: Every day | ORAL | Status: DC

## 2016-01-16 NOTE — Telephone Encounter (Signed)
Dexilant 60mg  sent in left message to return call

## 2016-01-16 NOTE — Telephone Encounter (Signed)
Patient insurance will no longer cover generic Nexium. It does cover Dexilant can we change? Please advise.

## 2016-01-16 NOTE — Telephone Encounter (Signed)
Please change to Dexilant 60 mg one time per day.

## 2016-01-17 NOTE — Telephone Encounter (Signed)
Left message for patient that medication was sent to pharmacy and to call office if he had any questions.

## 2016-01-28 ENCOUNTER — Other Ambulatory Visit: Payer: Self-pay | Admitting: *Deleted

## 2016-01-28 MED ORDER — MONTELUKAST SODIUM 10 MG PO TABS: 10.0000 mg | ORAL_TABLET | Freq: Every day | ORAL | Status: DC

## 2016-02-06 ENCOUNTER — Telehealth: Payer: Self-pay | Admitting: *Deleted

## 2016-02-06 NOTE — Telephone Encounter (Signed)
Call from pt - states his plantar fasciitis has flared-up and wants to try dry needling and deep tissue massage. Wants to know if this treatment will be ok?  And does he still has blood clot in his leg?

## 2016-02-06 NOTE — Telephone Encounter (Signed)
Call pt: I do not think this is a good idea while he is on a blood thinner.  He should check with his orthopedic surgeon or sport's medicine doctor who has been following this problem. I do not think he has residual clot in his leg but I think "dry needling" (which is something I never heard of) & deep massage is going to cause significant bruising and may make things worse.

## 2016-02-07 NOTE — Telephone Encounter (Signed)
Call from pt - informed "I do not think this is a good idea while he is on a blood thinner. He should check with his orthopedic surgeon or sport's medicine doctor who has been following this problem. I do not think he has residual clot in his leg but I think "dry needling" (which is something I never heard of) & deep massage is going to cause significant bruising and may make things worse."per Dr Rosezena Sensor. Stated he did want to see ortho surgeon b/c he was the one who caused the problem but he will call and schedule an appt with [facility].

## 2016-02-08 ENCOUNTER — Telehealth: Payer: Self-pay | Admitting: *Deleted

## 2016-02-08 DIAGNOSIS — M722 Plantar fascial fibromatosis: Secondary | ICD-10-CM

## 2016-02-08 NOTE — Telephone Encounter (Signed)
Order placed

## 2016-02-11 ENCOUNTER — Ambulatory Visit: Payer: Managed Care, Other (non HMO) | Attending: Family Medicine | Admitting: Physical Therapy

## 2016-02-11 DIAGNOSIS — R29898 Other symptoms and signs involving the musculoskeletal system: Secondary | ICD-10-CM | POA: Diagnosis present

## 2016-02-11 DIAGNOSIS — R252 Cramp and spasm: Secondary | ICD-10-CM | POA: Diagnosis present

## 2016-02-11 DIAGNOSIS — R2689 Other abnormalities of gait and mobility: Secondary | ICD-10-CM | POA: Diagnosis present

## 2016-02-11 DIAGNOSIS — M25571 Pain in right ankle and joints of right foot: Secondary | ICD-10-CM | POA: Diagnosis not present

## 2016-02-11 DIAGNOSIS — M6281 Muscle weakness (generalized): Secondary | ICD-10-CM

## 2016-02-11 NOTE — Therapy (Signed)
[place], [place], [address] Phone: [phone]   Fax:  [phone]  Physical Therapy Evaluation  Patient Details  Name: Michael [name] MRN: [id number] Date of Birth: 12/04/1955 Referring Provider: Lilia Argue MD  Encounter Date: 02/11/2016      PT End of Session - 02/11/16 1228    Visit Number 1   Number of Visits 13   Date for PT Re-Evaluation 03/24/16   Authorization Type [employer]   PT Start Time [phone]   PT Stop Time 1018   PT Time Calculation (min) 42 min   Activity Tolerance Patient tolerated treatment well   Behavior During Therapy [facility] for tasks assessed/performed      Past Medical History  Diagnosis Date  . HTN (hypertension)   . Hyperlipidemia   . HIV (human immunodeficiency virus infection) ([place])   . GERD (gastroesophageal reflux disease)   . Pulmonary embolism ([place]) 12/2013  . DVT (deep venous thrombosis) ([place]) 07/2015; 10/01/2015    RLE; RLE  . 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 Childhood asthma   . Pneumonia ~ 2000  . Hepatitis B   . Anxiety     Past Surgical History  Procedure Laterality Date  . Shoulder arthroscopy w/ rotator cuff repair Bilateral 2000's    "twice on right; once on the left"  . Anterior cervical decomp/discectomy fusion  2010    C40-C5  . Appendectomy    . Tonsillectomy    . Colonoscopy    . Shoulder arthroscopy with subacromial decompression Right 12/15/2013    Procedure: RIGHT SHOULDER ARTHROSCOPY WITH EXTENSIVE DEBRIDEMENT OF ROTATOR CUFF,SUBACROMIAL DECOMPRESSION, DEBRIDMENT OF BURSA;  Surgeon: Ninetta Lights, MD;  Location: [facility];  Service: Orthopedics;  Laterality: Right;  . Back surgery      There were no vitals filed for this visit.       Subjective Assessment - 02/11/16 0945    Subjective pt is a 60 y.o with CC or R foot/ calf pain that has been going off/ on for the last 2-3 years with the last year being worse. He  reports doing more lifting with a personal training and it gradually got worse and worse. reports chronci issue after doing more activity. He has orthotics, and that they help. He has seen multiple PT's in the past with little to no benefit.    Pertinent History HIV, hx of blood clots   How long can you sit comfortably? unlimited   How long can you stand comfortably? unlimited   How long can you walk comfortably? unlimited   Diagnostic tests x-rays - 04-04-2015   Patient Stated Goals to not have pain, to be able to go to the gym and exercise without pain,   Currently in Pain? Yes   Pain Score 5    Pain Location Foot   Pain Orientation Right   Pain Descriptors / Indicators Dull;Aching   Pain Type Chronic pain   Pain Onset More than a month ago   Pain Frequency Intermittent   Aggravating Factors  running, standing, getting up in the morning,    Pain Relieving Factors orthotic, icing, taping            [facility] PT Assessment - 02/11/16 0956    Assessment   Medical Diagnosis R plantar fasciitis   Referring Provider Lilia Argue MD   Onset Date/Surgical Date --  2-3 years   Hand Dominance Right   Next MD Visit Make on PRN  Prior Therapy yes   Precautions   Precautions None   Restrictions   Weight Bearing Restrictions No   Balance Screen   Has the patient fallen in the past 6 months No   Has the patient had a decrease in activity level because of a fear of falling?  No   Is the patient reluctant to leave their home because of a fear of falling?  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  [place] Requirements prolonged sitting,    Leisure running, exercising, swimming, hiking,    Cognition   Overall Cognitive Status Within Functional Limits for tasks assessed   Observation/Other Assessments   Observations hypertrophy of bil calf muscles   Lower Extremity Functional Scale  53/80   Posture/Postural Control   Posture/Postural Control Postural limitations   Postural Limitations Rounded Shoulders;Forward head   ROM / Strength   AROM / PROM / Strength AROM;PROM;Strength   AROM   AROM Assessment Site Ankle   Right/Left Ankle Right;Left   Right Ankle Dorsiflexion 1  from nuetral   Right Ankle Plantar Flexion 52  end range pain   Right Ankle Inversion 28   Right Ankle Eversion 30   Left Ankle Dorsiflexion 5   Left Ankle Plantar Flexion 54   Left Ankle Inversion 30   Left Ankle Eversion 28   PROM   PROM Assessment Site Ankle   Right/Left Ankle Right   Right Ankle Dorsiflexion 10  end range pain   Right Ankle Plantar Flexion 60   Right Ankle Inversion 32   Right Ankle Eversion 36   Strength   Strength Assessment Site Ankle   Right/Left Ankle Right;Left   Right Ankle Dorsiflexion 4/5   Right Ankle Plantar Flexion 4/5  pain during testing   Right Ankle Inversion 4/5  pain during testing   Right Ankle Eversion 4/5  pain during testing   Left Ankle Dorsiflexion 5/5   Left Ankle Plantar Flexion 5/5   Left Ankle Inversion 5/5   Left Ankle Eversion 5/5   Palpation   Palpation comment tendnerness at the R posterior tibialis, multipel trigger points in the R calf, soreness along Plantar fascia, pain at the medial calcaneal tubercle  weakness of bil posterior tibialis   Ambulation/Gait   Ambulation/Gait Yes   Gait Pattern Decreased stride length;Antalgic;Poor foot clearance - right                           PT Education - 02/11/16 1228    Education provided Yes   Education Details evaluation findings, POC, Goals, HEP,    Person(s)  Educated Patient   Methods Explanation;Handout   Comprehension Verbalized understanding          PT Short Term Goals - 02/11/16 1237    PT SHORT TERM GOAL #1   Title pt will be I with inital HEP (03/03/2016)   Time 3   Period Weeks   Status New   PT SHORT TERM GOAL #2   Title pt will be able to  verbalize and demonsrate techniques to reduce R foot pain and inflammtion via RICE and stretching (03/03/2016)   Time 3   Period Weeks   Status New           PT Long Term Goals - 02/11/16 1243    PT LONG TERM GOAL #1   Title pt will be I with all HEP as of last visit (03/24/2016)   Time 6   Period Weeks   Status New   PT LONG TERM GOAL #2   Title pt will improve R ankle DF AROM to >/= 6 degrees from neutral with </= 3/10 pain to demonstrate decrease tension in the calf and prevent tripping and catching of his foot on the ground for safety (03/24/2016)   Time 6   Period Weeks   Status New   PT LONG TERM GOAL #3   Title pt will improve R ankle 4-way strength to >/= 4+/5 in all planes and especially the posterior tibialis with </= 3/10 pain in all planes to promote support of the arch with walking and standing activites (03/24/2016)   Time 6   Period Weeks   Status New   PT LONG TERM GOAL #4   Title pt will report decrease tightness the calf to and decreased pain in the calf/ heel to promote decreased pain to </= 3/10 during and following walking/ running of >/= 45 min to assist with pt's personal goal of getting back to the gym ( 03/24/2016)   Time 6   Period Weeks   Status New   PT LONG TERM GOAL #5   Title pt will improve his LEFS score to >/= 60 to demonstrate improvement in function at discharge (03/24/2016)   Time 6   Period Weeks   Status New               Plan - 02/11/16 1229    Clinical Impression Statement Mr. Michael [name] present to OPPT as a Moderate complexity evaluation due to chronic plantar fasciitis that fluctuates in severity based on activitiy. He demonstrates  funcitonal AROM in the R ankle in all planes except for DF and soreness with inversion and PF , PROM is [facility] with pain noted with end range DF.  He demonstrated weakness with posterior tibialis and pain with Plantar flexion. Palpation revealed soreness in the calfs with multiptle trigger points, tenderness in the posterior tibialis, and a the medial calcaneal tubercle. He would benefit from physical therapy to improve strength, ankle DF mobility, running/ walking endurnace, decrease pain and return to PLOF by addressing the impairments listed.    Rehab Potential Good   PT Frequency 2x / week   PT Duration 6 weeks   PT Treatment/Interventions ADLs/Self Care Home Management;Electrical Stimulation;Iontophoresis 4mg /ml Dexamethasone;Moist Heat;Therapeutic exercise;Manual techniques;Therapeutic activities;Dry needling;Taping;Passive range of motion;Patient/family education;Ultrasound;Cryotherapy   PT Next Visit Plan assess / review HEP, assess strength of hips, DN education, dry needling/ manual,    PT Home Exercise Plan rolling over ball, rolling on frozen water bottle, calf stretching, towel scrunches   Consulted and Agree with Plan of Care Patient      Patient will benefit from skilled therapeutic intervention in order to improve the following deficits and impairments:  Pain, Improper body mechanics, Postural dysfunction, Decreased strength, Decreased mobility, Hypomobility, Decreased range of motion, Increased muscle spasms, Decreased endurance, Decreased activity tolerance, Decreased balance, Difficulty walking, Increased fascial restricitons  Visit Diagnosis: Pain in right ankle and joints of right foot - Plan: PT plan of care cert/re-cert  Muscle weakness (generalized) - Plan: PT plan of care cert/re-cert  Other abnormalities of gait and mobility - Plan: PT plan of care cert/re-cert  Other symptoms and signs involving the musculoskeletal system - Plan: PT plan of care cert/re-cert  Cramp and  spasm - Plan: PT plan of care cert/re-cert     Problem List Patient Active Problem List   Diagnosis Date Noted  . Acute deep vein thrombosis (DVT) of right lower extremity ([place]) 10/01/2015  . Sinusitis 10/01/2015  . Hypokalemia 10/01/2015  . DVT (deep venous thrombosis) ([place]) 10/01/2015  . Encounter for therapeutic drug monitoring 08/03/2015  . Essential hypertension 07/29/2015  . Hyperlipidemia 07/29/2015  . Pneumonia 02/01/2014  . HIV disease ([place]) 02/01/2014  . Acute pulmonary embolism ([place]) 02/01/2014  . PE (pulmonary embolism) 01/31/2014  . Impingement syndrome of right shoulder 12/15/2013  . Sacroiliac joint pain 04/12/2013  . Pain in joint, lower leg 02/24/2013  . Pain in left hip 02/24/2013  . 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 Abnormal EKG 07/07/2011  . Preop cardiovascular exam 07/07/2011  . ABDOMINAL PAIN, EPIGASTRIC 03/09/2009  . UNSPECIFIED LABYRINTHITIS 01/24/2009  . FOOT PAIN, LEFT 11/03/2008  . 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 PLANTAR FASCIITIS, BILATERAL 09/26/2008  . Hepatitis B virus infection 10/18/2007  . VENEREAL WART 10/18/2007  . LIVER HEMANGIOMA 10/18/2007  . HYPERLIPIDEMIA 10/18/2007  . DEPRESSION 10/18/2007  . HAY FEVER 10/18/2007  . PNEUMONIA 10/18/2007  . ASTHMA, CHILDHOOD 10/18/2007  . GASTROESOPHAGEAL REFLUX DISEASE 10/18/2007  . Other acquired absence of organ 10/18/2007  . ROTATOR CUFF REPAIR, RIGHT, HX OF 10/18/2007  . HIV DISEASE 08/05/2007   Michael [name] PT, DPT, LAT, ATC  02/11/2016  12:53 PM      [facility] [address] [place], [place], [address] Phone: [phone]   Fax:  [phone]  Name: Michael [name] MRN: [id number] Date of Birth: 09-Sep-1956

## 2016-02-19 ENCOUNTER — Ambulatory Visit: Payer: Managed Care, Other (non HMO) | Admitting: Physical Therapy

## 2016-02-19 DIAGNOSIS — M25571 Pain in right ankle and joints of right foot: Secondary | ICD-10-CM

## 2016-02-19 DIAGNOSIS — R2689 Other abnormalities of gait and mobility: Secondary | ICD-10-CM

## 2016-02-19 DIAGNOSIS — R29898 Other symptoms and signs involving the musculoskeletal system: Secondary | ICD-10-CM

## 2016-02-19 DIAGNOSIS — M6281 Muscle weakness (generalized): Secondary | ICD-10-CM

## 2016-02-19 DIAGNOSIS — R252 Cramp and spasm: Secondary | ICD-10-CM

## 2016-02-19 NOTE — Therapy (Signed)
Michael [name] [place], [place], [address] Phone: [phone]   Fax:  [phone]  Physical Therapy Treatment  Patient Details  Name: Michael [name] MRN: [id number] Date of Birth: [birth year] Referring Provider: Lilia Argue MD  Encounter Date: 02/19/2016      PT End of Session - 02/19/16 1503    Visit Number 2   Number of Visits 13   Date for PT Re-Evaluation 03/24/16   Authorization Type [employer]   PT Start Time 1415   PT Stop Time 1500   PT Time Calculation (min) 45 min   Activity Tolerance Patient tolerated treatment well   Behavior During Therapy [facility] for tasks assessed/performed      Past Medical History  Diagnosis Date  . HTN (hypertension)   . Hyperlipidemia   . HIV (human immunodeficiency virus infection) ([place])   . GERD (gastroesophageal reflux disease)   . Pulmonary embolism ([place]) 12/2013  . DVT (deep venous thrombosis) ([place]) 07/2015; 10/01/2015    RLE; RLE  . 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 Childhood asthma   . Pneumonia ~ 2000  . Hepatitis B   . Anxiety     Past Surgical History  Procedure Laterality Date  . Shoulder arthroscopy w/ rotator cuff repair Bilateral 2000's    "twice on right; once on the left"  . Anterior cervical decomp/discectomy fusion  2010    C40-C5  . Appendectomy    . Tonsillectomy    . Colonoscopy    . Shoulder arthroscopy with subacromial decompression Right 12/15/2013    Procedure: RIGHT SHOULDER ARTHROSCOPY WITH EXTENSIVE DEBRIDEMENT OF ROTATOR CUFF,SUBACROMIAL DECOMPRESSION, DEBRIDMENT OF BURSA;  Surgeon: Ninetta Lights, MD;  Location: [facility];  Service: Orthopedics;  Laterality: Right;  . Back surgery      There were no vitals filed for this visit.      Subjective Assessment - 02/19/16 1416    Subjective pt reports speakig with his hematologist and reports that they said no to the DN.   Currently in Pain? Yes   Pain Score 3    Pain  Orientation Right   Pain Descriptors / Indicators Aching;Dull   Pain Type Chronic pain   Pain Onset More than a month ago   Pain Frequency Intermittent   Aggravating Factors  running, standing, getting up in the morning   Pain Relieving Factors orthotic, icing, taping                         [facility] Adult PT Treatment/Exercise - 02/19/16 1457    Manual Therapy   Manual Therapy Soft tissue mobilization;Other (comment)   Soft tissue mobilization IASTM over the PF with great toe extension, twisting over the medial calcaneal tubercle. gastroc/ soleus with pt in prone and calf on stretch   Other Manual Therapy manual trigger point release over the quadratus plantae, and abductor hallicus longus, and 2 x soleus and 2 x gastrocnemius   Ankle Exercises: Stretches   Plantar Fascia Stretch 2 reps;30 seconds   Gastroc Stretch 2 reps;30 seconds   Ankle Exercises: Seated   Other Seated Ankle Exercises toe yoga 2 x 10   difficulty isolating toes                PT Education - 02/19/16 1503    Education provided Yes   Education Details manual trigger point release education    Person(s) Educated Patient   Methods Explanation  Comprehension Verbalized understanding          PT Short Term Goals - 02/11/16 1237    PT SHORT TERM GOAL #1   Title pt will be I with inital HEP (03/03/2016)   Time 3   Period Weeks   Status New   PT SHORT TERM GOAL #2   Title pt will be able to verbalize and demonsrate techniques to reduce R foot pain and inflammtion via RICE and stretching (03/03/2016)   Time 3   Period Weeks   Status New           PT Long Term Goals - 02/11/16 1243    PT LONG TERM GOAL #1   Title pt will be I with all HEP as of last visit (03/24/2016)   Time 6   Period Weeks   Status New   PT LONG TERM GOAL #2   Title pt will improve R ankle DF AROM to >/= 6 degrees from neutral with </= 3/10 pain to demonstrate decrease tension in the calf and prevent tripping  and catching of his foot on the ground for safety (03/24/2016)   Time 6   Period Weeks   Status New   PT LONG TERM GOAL #3   Title pt will improve R ankle 4-way strength to >/= 4+/5 in all planes and especially the posterior tibialis with </= 3/10 pain in all planes to promote support of the arch with walking and standing activites (03/24/2016)   Time 6   Period Weeks   Status New   PT LONG TERM GOAL #4   Title pt will report decrease tightness the calf to and decreased pain in the calf/ heel to promote decreased pain to </= 3/10 during and following walking/ running of >/= 45 min to assist with pt's personal goal of getting back to the gym ( 03/24/2016)   Time 6   Period Weeks   Status New   PT LONG TERM GOAL #5   Title pt will improve his LEFS score to >/= 60 to demonstrate improvement in function at discharge (03/24/2016)   Time 6   Period Weeks   Status New               Plan - 02/19/16 1503    Clinical Impression Statement Mr. Michael [name] reports tha this hematologist stated no for dry needling so opted to perform manual trigger point release with IASTM on the foot and calf. following he was able to do exercises and stretches and he reported decreased pain with standing and walking.    PT Next Visit Plan assess strength of hips, manual trigger point release, IASTM, assess response to last visit.    Consulted and Agree with Plan of Care Patient      Patient will benefit from skilled therapeutic intervention in order to improve the following deficits and impairments:  Pain, Improper body mechanics, Postural dysfunction, Decreased strength, Decreased mobility, Hypomobility, Decreased range of motion, Increased muscle spasms, Decreased endurance, Decreased activity tolerance, Decreased balance, Difficulty walking, Increased fascial restricitons  Visit Diagnosis: Pain in right ankle and joints of right foot  Muscle weakness (generalized)  Other abnormalities of gait and  mobility  Other symptoms and signs involving the musculoskeletal system  Cramp and spasm     Problem List Patient Active Problem List   Diagnosis Date Noted  . Acute deep vein thrombosis (DVT) of right lower extremity ([facility]) 10/01/2015  . Sinusitis 10/01/2015  . Hypokalemia 10/01/2015  . DVT (deep  venous thrombosis) ([place]) 10/01/2015  . Encounter for therapeutic drug monitoring 08/03/2015  . Essential hypertension 07/29/2015  . Hyperlipidemia 07/29/2015  . Pneumonia 02/01/2014  . HIV disease ([place]) 02/01/2014  . Acute pulmonary embolism ([place]) 02/01/2014  . PE (pulmonary embolism) 01/31/2014  . Impingement syndrome of right shoulder 12/15/2013  . Sacroiliac joint pain 04/12/2013  . Pain in joint, lower leg 02/24/2013  . Pain in left hip 02/24/2013  . 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 Abnormal EKG 07/07/2011  . Preop cardiovascular exam 07/07/2011  . ABDOMINAL PAIN, EPIGASTRIC 03/09/2009  . UNSPECIFIED LABYRINTHITIS 01/24/2009  . FOOT PAIN, LEFT 11/03/2008  . 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 PLANTAR FASCIITIS, BILATERAL 09/26/2008  . Hepatitis B virus infection 10/18/2007  . VENEREAL WART 10/18/2007  . LIVER HEMANGIOMA 10/18/2007  . HYPERLIPIDEMIA 10/18/2007  . DEPRESSION 10/18/2007  . HAY FEVER 10/18/2007  . PNEUMONIA 10/18/2007  . ASTHMA, CHILDHOOD 10/18/2007  . GASTROESOPHAGEAL REFLUX DISEASE 10/18/2007  . Other acquired absence of organ 10/18/2007  . ROTATOR CUFF REPAIR, RIGHT, HX OF 10/18/2007  . HIV DISEASE 08/05/2007   [place] PT, DPT, LAT, ATC  02/19/2016  3:06 PM      [facility] [address] [place], [place], [address] Phone: [phone]   Fax:  [phone]  Name: Michael [name] MRN: [id number] Date of Birth: 14-Apr-1956

## 2016-02-21 ENCOUNTER — Ambulatory Visit: Payer: Managed Care, Other (non HMO) | Admitting: Physical Therapy

## 2016-02-21 DIAGNOSIS — R29898 Other symptoms and signs involving the musculoskeletal system: Secondary | ICD-10-CM

## 2016-02-21 DIAGNOSIS — M25571 Pain in right ankle and joints of right foot: Secondary | ICD-10-CM

## 2016-02-21 DIAGNOSIS — R252 Cramp and spasm: Secondary | ICD-10-CM

## 2016-02-21 DIAGNOSIS — R2689 Other abnormalities of gait and mobility: Secondary | ICD-10-CM

## 2016-02-21 DIAGNOSIS — M6281 Muscle weakness (generalized): Secondary | ICD-10-CM

## 2016-02-21 NOTE — Therapy (Signed)
[place], [place], [address] Phone: [phone]   Fax:  [phone]  Physical Therapy Treatment  Patient Details  Name: Michael [name] MRN: [id number] Date of Birth: 1956-03-05 Referring Provider: Lilia Argue MD  Encounter Date: 02/21/2016      PT End of Session - 02/21/16 1019    Visit Number 3   Number of Visits 13   Date for PT Re-Evaluation 03/24/16   Authorization Type [employer]   PT Start Time [phone]   PT Stop Time 1016   PT Time Calculation (min) 45 min   Activity Tolerance Patient tolerated treatment well   Behavior During Therapy [facility] for tasks assessed/performed      Past Medical History  Diagnosis Date  . HTN (hypertension)   . Hyperlipidemia   . HIV (human immunodeficiency virus infection) ([place])   . GERD (gastroesophageal reflux disease)   . Pulmonary embolism ([place]) 12/2013  . DVT (deep venous thrombosis) ([place]) 07/2015; 10/01/2015    RLE; RLE  . 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 Childhood asthma   . Pneumonia ~ 2000  . Hepatitis B   . Anxiety     Past Surgical History  Procedure Laterality Date  . Shoulder arthroscopy w/ rotator cuff repair Bilateral 2000's    "twice on right; once on the left"  . Anterior cervical decomp/discectomy fusion  2010    C40-C5  . Appendectomy    . Tonsillectomy    . Colonoscopy    . Shoulder arthroscopy with subacromial decompression Right 12/15/2013    Procedure: RIGHT SHOULDER ARTHROSCOPY WITH EXTENSIVE DEBRIDEMENT OF ROTATOR CUFF,SUBACROMIAL DECOMPRESSION, DEBRIDMENT OF BURSA;  Surgeon: Ninetta Lights, MD;  Location: [place];  Service: Orthopedics;  Laterality: Right;  . Back surgery      There were no vitals filed for this visit.      Subjective Assessment - 02/21/16 0929    Subjective "I wasn't too sore, feeling like I am doing alittle better" haven't been on the feet due to having increased allergies.    Currently in Pain? Yes   Pain Score 2    Pain Location Foot   Pain Orientation Right   Pain Descriptors / Indicators Aching;Sore   Pain Type Chronic pain   Multiple Pain Sites Yes   Pain Score 3   Pain Location Foot   Pain Orientation Left   Pain Descriptors / Indicators Aching   Pain Onset More than a month ago   Pain Frequency Intermittent   Aggravating Factors  running, walking, standing,    Pain Relieving Factors ice, orthotic             [facility] PT Assessment - 02/21/16 0939    Strength   Strength Assessment Site Hip;Knee   Right/Left Hip Right;Left   Right Hip Flexion 4/5   Right Hip Extension 3+/5   Right Hip ABduction 3+/5   Right Hip ADduction 5/5   Left Hip Flexion 4+/5   Left Hip Extension 3+/5   Left Hip ABduction 3+/5   Left Hip ADduction 5/5   Right/Left Knee Right;Left   Right Knee Flexion 4+/5   Right Knee Extension 5/5   Left Knee Flexion 4+/5   Left Knee Extension 5/5                     [facility] Adult PT Treatment/Exercise - 02/21/16 1102    Knee/Hip Exercises: Standing   Other Standing Knee Exercises standing hip  abduction/ extension 2 x 10 each   Manual Therapy   Soft tissue mobilization IASTM over the bil PF with great toe extension, twisting over the medial calcaneal tubercle. gastroc/ soleus with pt in prone and calf on stretch   Other Manual Therapy manual trigger point release over bil the quadratus plantae, and abductor hallicus longus, and 2 x soleus and 2 x gastrocnemius   Ankle Exercises: Stretches   Plantar Fascia Stretch 2 reps;30 seconds   Gastroc Stretch 2 reps;30 seconds                PT Education - 02/21/16 1019    Education provided Yes   Education Details updated and reviewed new HEP and benefits for hip strengthening. how mechanics of how the ankle can effect the knee and hip/ vice versa and strengthening the hip will help with the foot.   Person(s) Educated Patient   Methods Explanation   Comprehension  Verbalized understanding          PT Short Term Goals - 02/21/16 1035    PT SHORT TERM GOAL #1   Title pt will be I with inital HEP (03/03/2016)   Time 3   Period Weeks   Status On-going   PT SHORT TERM GOAL #2   Title pt will be able to verbalize and demonsrate techniques to reduce R foot pain and inflammtion via RICE and stretching (03/03/2016)   Time 3   Period Weeks   Status On-going           PT Long Term Goals - 02/11/16 1243    PT LONG TERM GOAL #1   Title pt will be I with all HEP as of last visit (03/24/2016)   Time 6   Period Weeks   Status New   PT LONG TERM GOAL #2   Title pt will improve R ankle DF AROM to >/= 6 degrees from neutral with </= 3/10 pain to demonstrate decrease tension in the calf and prevent tripping and catching of his foot on the ground for safety (03/24/2016)   Time 6   Period Weeks   Status New   PT LONG TERM GOAL #3   Title pt will improve R ankle 4-way strength to >/= 4+/5 in all planes and especially the posterior tibialis with </= 3/10 pain in all planes to promote support of the arch with walking and standing activites (03/24/2016)   Time 6   Period Weeks   Status New   PT LONG TERM GOAL #4   Title pt will report decrease tightness the calf to and decreased pain in the calf/ heel to promote decreased pain to </= 3/10 during and following walking/ running of >/= 45 min to assist with pt's personal goal of getting back to the gym ( 03/24/2016)   Time 6   Period Weeks   Status New   PT LONG TERM GOAL #5   Title pt will improve his LEFS score to >/= 60 to demonstrate improvement in function at discharge (03/24/2016)   Time 6   Period Weeks   Status New               Plan - 02/21/16 1031    Clinical Impression Statement Mr. Cohea states he is doing better since the last session and that the L foot is more sore than the R. Further assesment of the hip strength he demonstrates decreased strength in the hip abductors and extensors.  Following manual  he reported decreas  pain in bil feet with standing and walking.    PT Next Visit Plan manual trigger point release, IASTM, assess response to last visit. Iontophoresis, hip strengthening,    PT Home Exercise Plan standing hip abduction/ extension,    Consulted and Agree with Plan of Care Patient      Patient will benefit from skilled therapeutic intervention in order to improve the following deficits and impairments:  Pain, Improper body mechanics, Postural dysfunction, Decreased strength, Decreased mobility, Hypomobility, Decreased range of motion, Increased muscle spasms, Decreased endurance, Decreased activity tolerance, Decreased balance, Difficulty walking, Increased fascial restricitons  Visit Diagnosis: Pain in right ankle and joints of right foot  Muscle weakness (generalized)  Other abnormalities of gait and mobility  Other symptoms and signs involving the musculoskeletal system  Cramp and spasm     Problem List Patient Active Problem List   Diagnosis Date Noted  . Acute deep vein thrombosis (DVT) of right lower extremity ([place]) 10/01/2015  . Sinusitis 10/01/2015  . Hypokalemia 10/01/2015  . DVT (deep venous thrombosis) ([place]) 10/01/2015  . Encounter for therapeutic drug monitoring 08/03/2015  . Essential hypertension 07/29/2015  . Hyperlipidemia 07/29/2015  . Pneumonia 02/01/2014  . HIV disease ([place]) 02/01/2014  . Acute pulmonary embolism ([place]) 02/01/2014  . PE (pulmonary embolism) 01/31/2014  . Impingement syndrome of right shoulder 12/15/2013  . Sacroiliac joint pain 04/12/2013  . Pain in joint, lower leg 02/24/2013  . Pain in left hip 02/24/2013  . 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 Abnormal EKG 07/07/2011  . Preop cardiovascular exam 07/07/2011  . ABDOMINAL PAIN, EPIGASTRIC 03/09/2009  . UNSPECIFIED LABYRINTHITIS 01/24/2009  . FOOT PAIN, LEFT 11/03/2008  . 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 PLANTAR FASCIITIS, BILATERAL  09/26/2008  . Hepatitis B virus infection 10/18/2007  . VENEREAL WART 10/18/2007  . LIVER HEMANGIOMA 10/18/2007  . HYPERLIPIDEMIA 10/18/2007  . DEPRESSION 10/18/2007  . HAY FEVER 10/18/2007  . PNEUMONIA 10/18/2007  . ASTHMA, CHILDHOOD 10/18/2007  . GASTROESOPHAGEAL REFLUX DISEASE 10/18/2007  . Other acquired absence of organ 10/18/2007  . ROTATOR CUFF REPAIR, RIGHT, HX OF 10/18/2007  . HIV DISEASE 08/05/2007   Starr Lake PT, DPT, LAT, ATC  02/21/2016  11:06 AM      [facility] [address] [place], [place], [address] Phone: [phone]   Fax:  [phone]  Name: REAFORD MIODUSZEWSKI MRN: [id number] Date of Birth: 06-18-1956

## 2016-02-26 ENCOUNTER — Ambulatory Visit: Payer: Managed Care, Other (non HMO) | Admitting: Physical Therapy

## 2016-02-26 DIAGNOSIS — R252 Cramp and spasm: Secondary | ICD-10-CM

## 2016-02-26 DIAGNOSIS — M25571 Pain in right ankle and joints of right foot: Secondary | ICD-10-CM

## 2016-02-26 DIAGNOSIS — M6281 Muscle weakness (generalized): Secondary | ICD-10-CM

## 2016-02-26 DIAGNOSIS — R2689 Other abnormalities of gait and mobility: Secondary | ICD-10-CM

## 2016-02-26 DIAGNOSIS — R29898 Other symptoms and signs involving the musculoskeletal system: Secondary | ICD-10-CM

## 2016-02-26 NOTE — Therapy (Signed)
[place], [place], [address] Phone: [phone]   Fax:  [phone]  Physical Therapy Treatment  Patient Details  Name: Michael [name] MRN: [id number] Date of Birth: 1956/04/11 Referring Provider: Lilia Argue MD  Encounter Date: 02/26/2016      PT End of Session - 02/26/16 1315    Visit Number 4   Number of Visits 13   Date for PT Re-Evaluation 03/24/16   Authorization Type [employer]   PT Start Time 0932   PT Stop Time 1016   PT Time Calculation (min) 44 min   Activity Tolerance Patient tolerated treatment well   Behavior During Therapy [facility] for tasks assessed/performed      Past Medical History  Diagnosis Date  . HTN (hypertension)   . Hyperlipidemia   . HIV (human immunodeficiency virus infection) ([place])   . GERD (gastroesophageal reflux disease)   . Pulmonary embolism ([place]) 12/2013  . DVT (deep venous thrombosis) ([place]) 07/2015; 10/01/2015    RLE; RLE  . 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 Childhood asthma   . Pneumonia ~ 2000  . Hepatitis B   . Anxiety     Past Surgical History  Procedure Laterality Date  . Shoulder arthroscopy w/ rotator cuff repair Bilateral 2000's    "twice on right; once on the left"  . Anterior cervical decomp/discectomy fusion  2010    C40-C5  . Appendectomy    . Tonsillectomy    . Colonoscopy    . Shoulder arthroscopy with subacromial decompression Right 12/15/2013    Procedure: RIGHT SHOULDER ARTHROSCOPY WITH EXTENSIVE DEBRIDEMENT OF ROTATOR CUFF,SUBACROMIAL DECOMPRESSION, DEBRIDMENT OF BURSA;  Surgeon: Ninetta Lights, MD;  Location: [place];  Service: Orthopedics;  Laterality: Right;  . Back surgery      There were no vitals filed for this visit.      Subjective Assessment - 02/26/16 0932    Subjective "I am feeling more sore today reporting pain as 5/10"   Currently in Pain? Yes   Pain Score 5    Pain Location Foot   Pain  Orientation Right   Pain Descriptors / Indicators Throbbing;Sore;Tender   Pain Type Chronic pain   Aggravating Factors  walking, standing,    Pain Relieving Factors massage, icing   Pain Score 2   Pain Location Foot   Pain Orientation Left   Pain Descriptors / Indicators Aching   Pain Type Chronic pain   Pain Frequency Intermittent   Aggravating Factors  running, walking, standing   Pain Relieving Factors ice, orthotic                         [facility] Adult PT Treatment/Exercise - 02/26/16 1319    Modalities   Modalities Iontophoresis   Iontophoresis   Type of Iontophoresis Dexamethasone   Location bil medial calcaneal tubercle   Dose 1cc/ x 2   Time 6 hour stat patch   Manual Therapy   Soft tissue mobilization IASTM over the bil PF with great toe extension, twisting over the medial calcaneal tubercle. gastroc/ soleus with pt in prone and calf on stretch   Other Manual Therapy manual trigger point release over bil the quadratus plantae, and abductor hallicus longus, and 2 x soleus and 2 x gastrocnemius   Ankle Exercises: Stretches   Slant Board Stretch 4 reps;30 seconds                PT  Education - 02/26/16 1313    Education provided Yes   Education Details iontophoresis education benefits for PF   Person(s) Educated Patient   Methods Explanation   Comprehension Verbalized understanding          PT Short Term Goals - 02/21/16 1035    PT SHORT TERM GOAL #1   Title pt will be I with inital HEP (03/03/2016)   Time 3   Period Weeks   Status On-going   PT SHORT TERM GOAL #2   Title pt will be able to verbalize and demonsrate techniques to reduce R foot pain and inflammtion via RICE and stretching (03/03/2016)   Time 3   Period Weeks   Status On-going           PT Long Term Goals - 02/11/16 1243    PT LONG TERM GOAL #1   Title pt will be I with all HEP as of last visit (03/24/2016)   Time 6   Period Weeks   Status New   PT LONG TERM GOAL  #2   Title pt will improve R ankle DF AROM to >/= 6 degrees from neutral with </= 3/10 pain to demonstrate decrease tension in the calf and prevent tripping and catching of his foot on the ground for safety (03/24/2016)   Time 6   Period Weeks   Status New   PT LONG TERM GOAL #3   Title pt will improve R ankle 4-way strength to >/= 4+/5 in all planes and especially the posterior tibialis with </= 3/10 pain in all planes to promote support of the arch with walking and standing activites (03/24/2016)   Time 6   Period Weeks   Status New   PT LONG TERM GOAL #4   Title pt will report decrease tightness the calf to and decreased pain in the calf/ heel to promote decreased pain to </= 3/10 during and following walking/ running of >/= 45 min to assist with pt's personal goal of getting back to the gym ( 03/24/2016)   Time 6   Period Weeks   Status New   PT LONG TERM GOAL #5   Title pt will improve his LEFS score to >/= 60 to demonstrate improvement in function at discharge (03/24/2016)   Time 6   Period Weeks   Status New               Plan - 02/26/16 1315    Clinical Impression Statement Mr. Raineri reports having increased soreness inthe R foot rated at 5/10. Focused todays session on manual with which he reported relief of pain to 2-3/5 in R foot. utliized Ionotphoresis today to help calm down pain and inflammation.    PT Next Visit Plan assess response to iontophoresis, manual trigger point release, IASTM, assess response to last visit. Iontophoresis, hip strengthening,    Consulted and Agree with Plan of Care Patient      Patient will benefit from skilled therapeutic intervention in order to improve the following deficits and impairments:  Pain, Improper body mechanics, Postural dysfunction, Decreased strength, Decreased mobility, Hypomobility, Decreased range of motion, Increased muscle spasms, Decreased endurance, Decreased activity tolerance, Decreased balance, Difficulty walking,  Increased fascial restricitons  Visit Diagnosis: Pain in right ankle and joints of right foot  Muscle weakness (generalized)  Other abnormalities of gait and mobility  Other symptoms and signs involving the musculoskeletal system  Cramp and spasm     Problem List Patient Active Problem List  Diagnosis Date Noted  . Acute deep vein thrombosis (DVT) of right lower extremity ([place]) 10/01/2015  . Sinusitis 10/01/2015  . Hypokalemia 10/01/2015  . DVT (deep venous thrombosis) ([place]) 10/01/2015  . Encounter for therapeutic drug monitoring 08/03/2015  . Essential hypertension 07/29/2015  . Hyperlipidemia 07/29/2015  . Pneumonia 02/01/2014  . HIV disease ([place]) 02/01/2014  . Acute pulmonary embolism ([place]) 02/01/2014  . PE (pulmonary embolism) 01/31/2014  . Impingement syndrome of right shoulder 12/15/2013  . Sacroiliac joint pain 04/12/2013  . Pain in joint, lower leg 02/24/2013  . Pain in left hip 02/24/2013  . 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 Abnormal EKG 07/07/2011  . Preop cardiovascular exam 07/07/2011  . ABDOMINAL PAIN, EPIGASTRIC 03/09/2009  . UNSPECIFIED LABYRINTHITIS 01/24/2009  . FOOT PAIN, LEFT 11/03/2008  . 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 PLANTAR FASCIITIS, BILATERAL 09/26/2008  . Hepatitis B virus infection 10/18/2007  . VENEREAL WART 10/18/2007  . LIVER HEMANGIOMA 10/18/2007  . HYPERLIPIDEMIA 10/18/2007  . DEPRESSION 10/18/2007  . HAY FEVER 10/18/2007  . PNEUMONIA 10/18/2007  . ASTHMA, CHILDHOOD 10/18/2007  . GASTROESOPHAGEAL REFLUX DISEASE 10/18/2007  . Other acquired absence of organ 10/18/2007  . ROTATOR CUFF REPAIR, RIGHT, HX OF 10/18/2007  . HIV DISEASE 08/05/2007   Starr Lake PT, DPT, LAT, ATC  02/26/2016  1:21 PM      [facility] [address] [place], [place], [address] Phone: [phone]   Fax:  [phone]  Name: JOAQUIN LINARES MRN: [id number] Date of  Birth: 1956/05/07

## 2016-02-29 ENCOUNTER — Ambulatory Visit: Payer: Managed Care, Other (non HMO) | Admitting: Physical Therapy

## 2016-03-04 ENCOUNTER — Ambulatory Visit: Payer: Managed Care, Other (non HMO) | Admitting: Physical Therapy

## 2016-03-04 DIAGNOSIS — R29898 Other symptoms and signs involving the musculoskeletal system: Secondary | ICD-10-CM

## 2016-03-04 DIAGNOSIS — R252 Cramp and spasm: Secondary | ICD-10-CM

## 2016-03-04 DIAGNOSIS — R2689 Other abnormalities of gait and mobility: Secondary | ICD-10-CM

## 2016-03-04 DIAGNOSIS — M6281 Muscle weakness (generalized): Secondary | ICD-10-CM

## 2016-03-04 DIAGNOSIS — M25571 Pain in right ankle and joints of right foot: Secondary | ICD-10-CM

## 2016-03-04 NOTE — Patient Instructions (Addendum)
    2 x 15 reps should feel the muscle working on the inside of the lower calf.

## 2016-03-04 NOTE — Therapy (Signed)
[place], [place], [address] Phone: [phone]   Fax:  [phone]  Physical Therapy Treatment  Patient Details  Name: Michael [name] MRN: [id number] Date of Birth: [birth year] Referring Provider: Lilia Argue MD  Encounter Date: 03/04/2016      PT End of Session - 03/04/16 1032    Visit Number 5   Number of Visits 13   Date for PT Re-Evaluation 03/24/16   Authorization Type [employer]   PT Start Time 0935   PT Stop Time 1030   PT Time Calculation (min) 55 min   Activity Tolerance Patient tolerated treatment well   Behavior During Therapy [facility] for tasks assessed/performed      Past Medical History  Diagnosis Date  . HTN (hypertension)   . Hyperlipidemia   . HIV (human immunodeficiency virus infection) ([place])   . GERD (gastroesophageal reflux disease)   . Pulmonary embolism ([place]) 12/2013  . DVT (deep venous thrombosis) ([place]) 07/2015; 10/01/2015    RLE; RLE  . 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 Childhood asthma   . Pneumonia ~ 2000  . Hepatitis B   . Anxiety     Past Surgical History  Procedure Laterality Date  . Shoulder arthroscopy w/ rotator cuff repair Bilateral 2000's    "twice on right; once on the left"  . Anterior cervical decomp/discectomy fusion  2010    C40-C5  . Appendectomy    . Tonsillectomy    . Colonoscopy    . Shoulder arthroscopy with subacromial decompression Right 12/15/2013    Procedure: RIGHT SHOULDER ARTHROSCOPY WITH EXTENSIVE DEBRIDEMENT OF ROTATOR CUFF,SUBACROMIAL DECOMPRESSION, DEBRIDMENT OF BURSA;  Surgeon: Ninetta Lights, MD;  Location: [place];  Service: Orthopedics;  Laterality: Right;  . Back surgery      There were no vitals filed for this visit.      Subjective Assessment - 03/04/16 0933    Subjective "I am feeling pretty good" The stretches, toe yoga and icing has helped   Currently in Pain? Yes   Pain Score 2    Pain Location  Foot   Pain Orientation Right   Pain Descriptors / Indicators Aching;Throbbing   Pain Type Chronic pain   Pain Onset More than a month ago   Pain Frequency Intermittent   Aggravating Factors  intermittent prolong walking/ standing   Pain Relieving Factors exercise regiment, icing    Pain Score 0   Pain Location Foot   Pain Orientation Left   Pain Type Chronic pain   Pain Onset More than a month ago   Pain Frequency Intermittent                         [facility] Adult PT Treatment/Exercise - 03/04/16 1006    Iontophoresis   Type of Iontophoresis Dexamethasone   Location Rmedial calcaneal tubercle   Dose 1cc   Time 6 hour stat patch   Manual Therapy   Soft tissue mobilization IASTM over the R PF with great toe extension, twisting over the medial calcaneal tubercle. gastroc/ soleus with pt in prone and calf on stretch   Other Manual Therapy manual trigger point release over R the quadratus plantae, and abductor hallicus longus, and 2 x soleus and 2 x gastrocnemius   Ankle Exercises: Aerobic   Elliptical L5, elevation L 10, x 4 min   Ankle Exercises: Stretches   Plantar Fascia Stretch 2 reps;30 seconds  rolling on tennisball  Slant Board Stretch 3 reps;30 seconds   Ankle Exercises: Standing   Other Standing Ankle Exercises stand heel raise with inverions/ IR 2 x 10 from edge of step    Ankle Exercises: Seated   Other Seated Ankle Exercises toe yoga 2 x 10    Ankle Exercises: Supine   T-Band 4-way ankle red theraband                PT Education - 03/04/16 1032    Education provided Yes   Education Details updated HEP with reps/ sets and proper form to isolate the posterior tibialis   Person(s) Educated Patient   Methods Explanation;Handout   Comprehension Verbalized understanding          PT Short Term Goals - 03/04/16 1040    PT SHORT TERM GOAL #1   Title pt will be I with inital HEP (03/03/2016)   Time 3   Period Weeks   Status Achieved   PT  SHORT TERM GOAL #2   Title pt will be able to verbalize and demonsrate techniques to reduce R foot pain and inflammtion via RICE and stretching (03/03/2016)   Time 3   Period Weeks   Status Achieved           PT Long Term Goals - 03/04/16 1040    PT LONG TERM GOAL #1   Title pt will be I with all HEP as of last visit (03/24/2016)   Time 6   Period Weeks   Status On-going   PT LONG TERM GOAL #2   Title pt will improve R ankle DF AROM to >/= 6 degrees from neutral with </= 3/10 pain to demonstrate decrease tension in the calf and prevent tripping and catching of his foot on the ground for safety (03/24/2016)   Time 6   Period Weeks   Status On-going   PT LONG TERM GOAL #3   Title pt will improve R ankle 4-way strength to >/= 4+/5 in all planes and especially the posterior tibialis with </= 3/10 pain in all planes to promote support of the arch with walking and standing activites (03/24/2016)   Time 6   Period Weeks   Status On-going   PT LONG TERM GOAL #4   Title pt will report decrease tightness the calf to and decreased pain in the calf/ heel to promote decreased pain to </= 3/10 during and following walking/ running of >/= 45 min to assist with pt's personal goal of getting back to the gym ( 03/24/2016)   Time 6   Period Weeks   Status On-going   PT LONG TERM GOAL #5   Title pt will improve his LEFS score to >/= 60 to demonstrate improvement in function at discharge (03/24/2016)   Time 6   Period Weeks   Status On-going               Plan - 03/04/16 1034    Clinical Impression Statement Michael [name] states that he is doing better with decreased pain the R foot and no pain in the L. focused on the R foot due to pain free on the L. Following manual of the foot with IASTM and manual trigger release he reported relief of soreness. pt was able to perform ellipitical with some soreness but was able to complete activity. Continued to perform iontophoresis on R foot. Following he  reported that he only have soreness with walking.    PT Next Visit Plan assess response to  iontophoresis, manual trigger point release, IASTM, assess response to last visit. Iontophoresis, hip strengthening, progress standing foot strengthening.    PT Home Exercise Plan standing heel raises witn internal rotation   Consulted and Agree with Plan of Care Patient      Patient will benefit from skilled therapeutic intervention in order to improve the following deficits and impairments:  Pain, Improper body mechanics, Postural dysfunction, Decreased strength, Decreased mobility, Hypomobility, Decreased range of motion, Increased muscle spasms, Decreased endurance, Decreased activity tolerance, Decreased balance, Difficulty walking, Increased fascial restricitons  Visit Diagnosis: Pain in right ankle and joints of right foot  Muscle weakness (generalized)  Other abnormalities of gait and mobility  Other symptoms and signs involving the musculoskeletal system  Cramp and spasm     Problem List Patient Active Problem List   Diagnosis Date Noted  . Acute deep vein thrombosis (DVT) of right lower extremity ([place]) 10/01/2015  . Sinusitis 10/01/2015  . Hypokalemia 10/01/2015  . DVT (deep venous thrombosis) ([place]) 10/01/2015  . Encounter for therapeutic drug monitoring 08/03/2015  . Essential hypertension 07/29/2015  . Hyperlipidemia 07/29/2015  . Pneumonia 02/01/2014  . HIV disease ([place]) 02/01/2014  . Acute pulmonary embolism ([place]) 02/01/2014  . PE (pulmonary embolism) 01/31/2014  . Impingement syndrome of right shoulder 12/15/2013  . Sacroiliac joint pain 04/12/2013  . Pain in joint, lower leg 02/24/2013  . Pain in left hip 02/24/2013  . 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 Abnormal EKG 07/07/2011  . Preop cardiovascular exam 07/07/2011  . ABDOMINAL PAIN, EPIGASTRIC 03/09/2009  . UNSPECIFIED LABYRINTHITIS 01/24/2009  . FOOT PAIN, LEFT 11/03/2008  . MORTON'S NEUROMA,  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  . PLANTAR FASCIITIS, BILATERAL 09/26/2008  . Hepatitis B virus infection 10/18/2007  . VENEREAL WART 10/18/2007  . LIVER HEMANGIOMA 10/18/2007  . HYPERLIPIDEMIA 10/18/2007  . DEPRESSION 10/18/2007  . HAY FEVER 10/18/2007  . PNEUMONIA 10/18/2007  . ASTHMA, CHILDHOOD 10/18/2007  . GASTROESOPHAGEAL REFLUX DISEASE 10/18/2007  . Other acquired absence of organ 10/18/2007  . ROTATOR CUFF REPAIR, RIGHT, HX OF 10/18/2007  . HIV DISEASE 08/05/2007   Starr Lake PT, DPT, LAT, ATC  03/04/2016  10:46 AM      [facility] [address] [place], [place], [address] Phone: [phone]   Fax:  [phone]  Name: RANI BYBEE MRN: [id number] Date of Birth: 05/24/1956

## 2016-03-06 ENCOUNTER — Ambulatory Visit: Payer: Managed Care, Other (non HMO) | Attending: Family Medicine | Admitting: Physical Therapy

## 2016-03-06 DIAGNOSIS — R252 Cramp and spasm: Secondary | ICD-10-CM | POA: Diagnosis present

## 2016-03-06 DIAGNOSIS — M6281 Muscle weakness (generalized): Secondary | ICD-10-CM | POA: Diagnosis present

## 2016-03-06 DIAGNOSIS — M25571 Pain in right ankle and joints of right foot: Secondary | ICD-10-CM | POA: Insufficient documentation

## 2016-03-06 DIAGNOSIS — R29898 Other symptoms and signs involving the musculoskeletal system: Secondary | ICD-10-CM | POA: Diagnosis present

## 2016-03-06 DIAGNOSIS — R2689 Other abnormalities of gait and mobility: Secondary | ICD-10-CM | POA: Diagnosis present

## 2016-03-06 NOTE — Therapy (Signed)
[place], [place], [address] Phone: [phone]   Fax:  [phone]  Physical Therapy Treatment  Patient Details  Name: Michael [name] MRN: [id number] Date of Birth: [birth year] Referring Provider: Lilia Argue MD  Encounter Date: 03/06/2016      PT End of Session - 03/06/16 1057    Visit Number 6   Number of Visits 13   Date for PT Re-Evaluation 03/24/16   Authorization Type [employer]   PT Start Time 0932   PT Stop Time 1020   PT Time Calculation (min) 48 min   Activity Tolerance Patient tolerated treatment well   Behavior During Therapy [facility] for tasks assessed/performed      Past Medical History  Diagnosis Date  . HTN (hypertension)   . Hyperlipidemia   . HIV (human immunodeficiency virus infection) ([place])   . GERD (gastroesophageal reflux disease)   . Pulmonary embolism ([place]) 12/2013  . DVT (deep venous thrombosis) ([place]) 07/2015; 10/01/2015    RLE; RLE  . 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 Childhood asthma   . Pneumonia ~ 2000  . Hepatitis B   . Anxiety     Past Surgical History  Procedure Laterality Date  . Shoulder arthroscopy w/ rotator cuff repair Bilateral 2000's    "twice on right; once on the left"  . Anterior cervical decomp/discectomy fusion  2010    C40-C5  . Appendectomy    . Tonsillectomy    . Colonoscopy    . Shoulder arthroscopy with subacromial decompression Right 12/15/2013    Procedure: RIGHT SHOULDER ARTHROSCOPY WITH EXTENSIVE DEBRIDEMENT OF ROTATOR CUFF,SUBACROMIAL DECOMPRESSION, DEBRIDMENT OF BURSA;  Surgeon: Ninetta Lights, MD;  Location: [place];  Service: Orthopedics;  Laterality: Right;  . Back surgery      There were no vitals filed for this visit.      Subjective Assessment - 03/06/16 0938    Subjective "Doing okay so far"    Currently in Pain? No/denies   Pain Score 0-No pain   Pain Location Foot   Pain Orientation Right                          [facility] Adult PT Treatment/Exercise - 03/06/16 0001    Iontophoresis   Type of Iontophoresis Dexamethasone   Location Rmedial calcaneal tubercle   Dose 1cc   Time 6 hour stat patch   Manual Therapy   Soft tissue mobilization IASTM over the R PF with great toe extension, twisting over the medial calcaneal tubercle. gastroc/ soleus with pt in prone and calf on stretch   Other Manual Therapy manual trigger point release over R the quadratus plantae, and abductor hallicus longus, and 2 x soleus and 2 x gastrocnemius   Ankle Exercises: Standing   Other Standing Ankle Exercises stand heel raise with inverions/ IR 2 x 10 from edge of step    Other Standing Ankle Exercises lateral band walks 4 x 10 with red theraband   Ankle Exercises: Seated   Other Seated Ankle Exercises toe yoga 2 x 10    Ankle Exercises: Supine   T-Band 4-ay ankle theraband strengthening, and posterior tib strengthening with x 20 each direction   Ankle Exercises: Stretches   Slant Board Stretch 3 reps;30 seconds             Balance Exercises - 03/06/16 1101    Balance Exercises: Standing   SLS Eyes open;Eyes closed;Solid  surface;5 reps;10 secs  2 eyes closed mod sway, 3 eyes open with min postural sway,            PT Education - 03/06/16 1021    Education provided Yes   Education Details updated HEP reps/sets and proper form and rationale for tx   Person(s) Educated Patient   Methods Explanation;Handout   Comprehension Verbalized understanding          PT Short Term Goals - 03/04/16 1040    PT SHORT TERM GOAL #1   Title pt will be I with inital HEP (03/03/2016)   Time 3   Period Weeks   Status Achieved   PT SHORT TERM GOAL #2   Title pt will be able to verbalize and demonsrate techniques to reduce R foot pain and inflammtion via RICE and stretching (03/03/2016)   Time 3   Period Weeks   Status Achieved           PT Long Term Goals - 03/04/16 1040    PT  LONG TERM GOAL #1   Title pt will be I with all HEP as of last visit (03/24/2016)   Time 6   Period Weeks   Status On-going   PT LONG TERM GOAL #2   Title pt will improve R ankle DF AROM to >/= 6 degrees from neutral with </= 3/10 pain to demonstrate decrease tension in the calf and prevent tripping and catching of his foot on the ground for safety (03/24/2016)   Time 6   Period Weeks   Status On-going   PT LONG TERM GOAL #3   Title pt will improve R ankle 4-way strength to >/= 4+/5 in all planes and especially the posterior tibialis with </= 3/10 pain in all planes to promote support of the arch with walking and standing activites (03/24/2016)   Time 6   Period Weeks   Status On-going   PT LONG TERM GOAL #4   Title pt will report decrease tightness the calf to and decreased pain in the calf/ heel to promote decreased pain to </= 3/10 during and following walking/ running of >/= 45 min to assist with pt's personal goal of getting back to the gym ( 03/24/2016)   Time 6   Period Weeks   Status On-going   PT LONG TERM GOAL #5   Title pt will improve his LEFS score to >/= 60 to demonstrate improvement in function at discharge (03/24/2016)   Time 6   Period Weeks   Status On-going               Plan - 03/06/16 1058    Clinical Impression Statement Mr. Winker continues to make progress with physical therapy with no report of pain in bil feet. peroformed manual along R calf and heel to continue to work on tightness of muscualture. progressing with standing exercises and started with balance training which he demonstrates difficulty with.  pt is progressing well toward goals.    PT Next Visit Plan assess response to iontophoresis, manual trigger point release, IASTM, assess response to last visit. Iontophoresis, hip strengthening, progress standing foot strengthening.    PT Home Exercise Plan lateral band walks   Consulted and Agree with Plan of Care Patient      Patient will benefit  from skilled therapeutic intervention in order to improve the following deficits and impairments:  Pain, Improper body mechanics, Postural dysfunction, Decreased strength, Decreased mobility, Hypomobility, Decreased range of motion, Increased muscle spasms,  Decreased endurance, Decreased activity tolerance, Decreased balance, Difficulty walking, Increased fascial restricitons  Visit Diagnosis: Pain in right ankle and joints of right foot  Muscle weakness (generalized)  Other abnormalities of gait and mobility  Other symptoms and signs involving the musculoskeletal system  Cramp and spasm     Problem List Patient Active Problem List   Diagnosis Date Noted  . Acute deep vein thrombosis (DVT) of right lower extremity ([place]) 10/01/2015  . Sinusitis 10/01/2015  . Hypokalemia 10/01/2015  . DVT (deep venous thrombosis) ([place]) 10/01/2015  . Encounter for therapeutic drug monitoring 08/03/2015  . Essential hypertension 07/29/2015  . Hyperlipidemia 07/29/2015  . Pneumonia 02/01/2014  . HIV disease ([place]) 02/01/2014  . Acute pulmonary embolism ([place]) 02/01/2014  . PE (pulmonary embolism) 01/31/2014  . Impingement syndrome of right shoulder 12/15/2013  . Sacroiliac joint pain 04/12/2013  . Pain in joint, lower leg 02/24/2013  . Pain in left hip 02/24/2013  . 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 Abnormal EKG 07/07/2011  . Preop cardiovascular exam 07/07/2011  . ABDOMINAL PAIN, EPIGASTRIC 03/09/2009  . UNSPECIFIED LABYRINTHITIS 01/24/2009  . FOOT PAIN, LEFT 11/03/2008  . 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 PLANTAR FASCIITIS, BILATERAL 09/26/2008  . Hepatitis B virus infection 10/18/2007  . VENEREAL WART 10/18/2007  . LIVER HEMANGIOMA 10/18/2007  . HYPERLIPIDEMIA 10/18/2007  . DEPRESSION 10/18/2007  . HAY FEVER 10/18/2007  . PNEUMONIA 10/18/2007  . ASTHMA, CHILDHOOD 10/18/2007  . GASTROESOPHAGEAL REFLUX DISEASE 10/18/2007  . Other acquired absence of organ 10/18/2007   . ROTATOR CUFF REPAIR, RIGHT, HX OF 10/18/2007  . HIV DISEASE 08/05/2007   Starr Lake PT, DPT, LAT, ATC  03/06/2016  11:04 AM      [facility] [address] [place], [place], [address] Phone: [phone]   Fax:  [phone]  Name: JAMARUS DECLARK MRN: [id number] Date of Birth: 1955/10/24

## 2016-03-11 ENCOUNTER — Ambulatory Visit: Payer: Managed Care, Other (non HMO) | Admitting: Physical Therapy

## 2016-03-11 DIAGNOSIS — R252 Cramp and spasm: Secondary | ICD-10-CM

## 2016-03-11 DIAGNOSIS — M6281 Muscle weakness (generalized): Secondary | ICD-10-CM

## 2016-03-11 DIAGNOSIS — R29898 Other symptoms and signs involving the musculoskeletal system: Secondary | ICD-10-CM

## 2016-03-11 DIAGNOSIS — M25571 Pain in right ankle and joints of right foot: Secondary | ICD-10-CM | POA: Diagnosis not present

## 2016-03-11 DIAGNOSIS — R2689 Other abnormalities of gait and mobility: Secondary | ICD-10-CM

## 2016-03-11 NOTE — Therapy (Signed)
[place], [place], [address] Phone: [phone]   Fax:  [phone]  Physical Therapy Treatment  Patient Details  Name: Michael [name] MRN: [id number] Date of Birth: 1956/04/05 Referring Provider: Lilia Argue MD  Encounter Date: 03/11/2016      PT End of Session - 03/11/16 1047    Visit Number 7   Number of Visits 13   Date for PT Re-Evaluation 03/24/16   Authorization Type [employer]   PT Start Time [phone]   PT Stop Time 1018   PT Time Calculation (min) 40 min   Activity Tolerance Patient tolerated treatment well   Behavior During Therapy [facility] for tasks assessed/performed      Past Medical History  Diagnosis Date  . HTN (hypertension)   . Hyperlipidemia   . HIV (human immunodeficiency virus infection) ([place])   . GERD (gastroesophageal reflux disease)   . Pulmonary embolism ([place]) 12/2013  . DVT (deep venous thrombosis) ([place]) 07/2015; 10/01/2015    RLE; RLE  . 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 Childhood asthma   . Pneumonia ~ 2000  . Hepatitis B   . Anxiety     Past Surgical History  Procedure Laterality Date  . Shoulder arthroscopy w/ rotator cuff repair Bilateral 2000's    "twice on right; once on the left"  . Anterior cervical decomp/discectomy fusion  2010    C40-C5  . Appendectomy    . Tonsillectomy    . Colonoscopy    . Shoulder arthroscopy with subacromial decompression Right 12/15/2013    Procedure: RIGHT SHOULDER ARTHROSCOPY WITH EXTENSIVE DEBRIDEMENT OF ROTATOR CUFF,SUBACROMIAL DECOMPRESSION, DEBRIDMENT OF BURSA;  Surgeon: Ninetta Lights, MD;  Location: [place];  Service: Orthopedics;  Laterality: Right;  . Back surgery      There were no vitals filed for this visit.      Subjective Assessment - 03/11/16 0944    Subjective "I am using new orthotics and have been doing well with exercises"   Currently in Pain? Yes   Pain Score 2   with standing to test    Pain Location Foot   Pain Orientation Right   Pain Descriptors / Indicators Sore   Pain Frequency Intermittent   Aggravating Factors  intermittnet soreness with standing without orthotic   Pain Relieving Factors exercises, ice   Pain Score 0                         [facility] Adult PT Treatment/Exercise - 03/11/16 0949    Knee/Hip Exercises: Sidelying   Hip ABduction AROM;Strengthening;Right;1 set;15 reps  4#, with 3 sec eccentric lowering   Other Sidelying Knee/Hip Exercises L sidelying abduction elliptical motion forward 2 x 10 with 4#,    Iontophoresis   Type of Iontophoresis Dexamethasone   Location Rmedial calcaneal tubercle   Dose 1cc   Time 6 hour stat patch   Manual Therapy   Soft tissue mobilization IASTM over the R PF with great toe extension, twisting over the medial calcaneal tubercle. gastroc/ soleus with pt in prone and calf on stretch   Other Manual Therapy manual trigger point release over R the quadratus plantae, and abductor hallicus longus, and 2 x soleus and 2 x gastrocnemius   Ankle Exercises: Seated   Other Seated Ankle Exercises toe yoga 2 x 10 just with RLE only   Ankle Exercises: Stretches   Slant Board Stretch 3 reps;30 seconds  PT Education - 03/11/16 1045    Education provided Yes   Education Details progress with running slowly using fartlek training techniques running 1:30 and walking 1:30 as one interval and performing a couple of intervals a few times a week to slowly progress with running to decrease from causing pain.    Person(s) Educated Patient   Methods Explanation;Verbal cues   Comprehension Verbalized understanding          PT Short Term Goals - 03/04/16 1040    PT SHORT TERM GOAL #1   Title pt will be I with inital HEP (03/03/2016)   Time 3   Period Weeks   Status Achieved   PT SHORT TERM GOAL #2   Title pt will be able to verbalize and demonsrate techniques to reduce R foot pain and  inflammtion via RICE and stretching (03/03/2016)   Time 3   Period Weeks   Status Achieved           PT Long Term Goals - 03/04/16 1040    PT LONG TERM GOAL #1   Title pt will be I with all HEP as of last visit (03/24/2016)   Time 6   Period Weeks   Status On-going   PT LONG TERM GOAL #2   Title pt will improve R ankle DF AROM to >/= 6 degrees from neutral with </= 3/10 pain to demonstrate decrease tension in the calf and prevent tripping and catching of his foot on the ground for safety (03/24/2016)   Time 6   Period Weeks   Status On-going   PT LONG TERM GOAL #3   Title pt will improve R ankle 4-way strength to >/= 4+/5 in all planes and especially the posterior tibialis with </= 3/10 pain in all planes to promote support of the arch with walking and standing activites (03/24/2016)   Time 6   Period Weeks   Status On-going   PT LONG TERM GOAL #4   Title pt will report decrease tightness the calf to and decreased pain in the calf/ heel to promote decreased pain to </= 3/10 during and following walking/ running of >/= 45 min to assist with pt's personal goal of getting back to the gym ( 03/24/2016)   Time 6   Period Weeks   Status On-going   PT LONG TERM GOAL #5   Title pt will improve his LEFS score to >/= 60 to demonstrate improvement in function at discharge (03/24/2016)   Time 6   Period Weeks   Status On-going               Plan - 03/11/16 1047    Clinical Impression Statement Mr. Carlon reports only 2/10 pain in the R foot with standing without his orhotic, Following manual and calf stretches he reports 0/10 pain with standing. performed sidelying hip strengthening which he reported some soreness with. continued Iontophoroesis pt is making good progress toward his goals, plan to down grade to 1 x a week for the next 4 weeks following next scheduled visit.    PT Next Visit Plan assess response to iontophoresis, manual trigger point release, IASTM, assess response to last  visit. Iontophoresis, hip strengthening, progress standing foot strengthening.    PT Home Exercise Plan fartlek training progressing slowly   Consulted and Agree with Plan of Care Patient      Patient will benefit from skilled therapeutic intervention in order to improve the following deficits and impairments:  Pain, Improper body mechanics,  Postural dysfunction, Decreased strength, Decreased mobility, Hypomobility, Decreased range of motion, Increased muscle spasms, Decreased endurance, Decreased activity tolerance, Decreased balance, Difficulty walking, Increased fascial restricitons  Visit Diagnosis: Pain in right ankle and joints of right foot  Muscle weakness (generalized)  Other abnormalities of gait and mobility  Other symptoms and signs involving the musculoskeletal system  Cramp and spasm     Problem List Patient Active Problem List   Diagnosis Date Noted  . Acute deep vein thrombosis (DVT) of right lower extremity ([place]) 10/01/2015  . Sinusitis 10/01/2015  . Hypokalemia 10/01/2015  . DVT (deep venous thrombosis) ([place]) 10/01/2015  . Encounter for therapeutic drug monitoring 08/03/2015  . Essential hypertension 07/29/2015  . Hyperlipidemia 07/29/2015  . Pneumonia 02/01/2014  . HIV disease ([place]) 02/01/2014  . Acute pulmonary embolism ([place]) 02/01/2014  . PE (pulmonary embolism) 01/31/2014  . Impingement syndrome of right shoulder 12/15/2013  . Sacroiliac joint pain 04/12/2013  . Pain in joint, lower leg 02/24/2013  . Pain in left hip 02/24/2013  . 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 Abnormal EKG 07/07/2011  . Preop cardiovascular exam 07/07/2011  . ABDOMINAL PAIN, EPIGASTRIC 03/09/2009  . UNSPECIFIED LABYRINTHITIS 01/24/2009  . FOOT PAIN, LEFT 11/03/2008  . 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 PLANTAR FASCIITIS, BILATERAL 09/26/2008  . Hepatitis B virus infection 10/18/2007  . VENEREAL WART 10/18/2007  . LIVER HEMANGIOMA 10/18/2007  .  HYPERLIPIDEMIA 10/18/2007  . DEPRESSION 10/18/2007  . HAY FEVER 10/18/2007  . PNEUMONIA 10/18/2007  . ASTHMA, CHILDHOOD 10/18/2007  . GASTROESOPHAGEAL REFLUX DISEASE 10/18/2007  . Other acquired absence of organ 10/18/2007  . ROTATOR CUFF REPAIR, RIGHT, HX OF 10/18/2007  . HIV DISEASE 08/05/2007   Starr Lake PT, DPT, LAT, ATC  03/11/2016  11:03 AM      [facility] [address] [place], [place], [address] Phone: [phone]   Fax:  [phone]  Name: TREVIS NYLEN MRN: [id number] Date of Birth: 1956/01/15

## 2016-03-13 ENCOUNTER — Ambulatory Visit: Payer: Managed Care, Other (non HMO) | Admitting: Physical Therapy

## 2016-03-13 DIAGNOSIS — R29898 Other symptoms and signs involving the musculoskeletal system: Secondary | ICD-10-CM

## 2016-03-13 DIAGNOSIS — M25571 Pain in right ankle and joints of right foot: Secondary | ICD-10-CM | POA: Diagnosis not present

## 2016-03-13 DIAGNOSIS — R2689 Other abnormalities of gait and mobility: Secondary | ICD-10-CM

## 2016-03-13 DIAGNOSIS — M6281 Muscle weakness (generalized): Secondary | ICD-10-CM

## 2016-03-13 DIAGNOSIS — R252 Cramp and spasm: Secondary | ICD-10-CM

## 2016-03-13 NOTE — Therapy (Signed)
[place], [place], [address] Phone: [phone]   Fax:  [phone]  Physical Therapy Treatment  Patient Details  Name: Michael [name] MRN: [id number] Date of Birth: [birth year] Referring Provider: Lilia Argue MD  Encounter Date: 03/13/2016      PT End of Session - 03/13/16 1100    Visit Number 8   Number of Visits 13   Date for PT Re-Evaluation 03/24/16   Authorization Type [employer]   PT Start Time [phone]   PT Stop Time 1017   PT Time Calculation (min) 46 min   Activity Tolerance Patient tolerated treatment well   Behavior During Therapy [facility] for tasks assessed/performed      Past Medical History  Diagnosis Date  . HTN (hypertension)   . Hyperlipidemia   . HIV (human immunodeficiency virus infection) ([place])   . GERD (gastroesophageal reflux disease)   . Pulmonary embolism ([place]) 12/2013  . DVT (deep venous thrombosis) ([place]) 07/2015; 10/01/2015    RLE; RLE  . 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 Childhood asthma   . Pneumonia ~ 2000  . Hepatitis B   . Anxiety     Past Surgical History  Procedure Laterality Date  . Shoulder arthroscopy w/ rotator cuff repair Bilateral 2000's    "twice on right; once on the left"  . Anterior cervical decomp/discectomy fusion  2010    C40-C5  . Appendectomy    . Tonsillectomy    . Colonoscopy    . Shoulder arthroscopy with subacromial decompression Right 12/15/2013    Procedure: RIGHT SHOULDER ARTHROSCOPY WITH EXTENSIVE DEBRIDEMENT OF ROTATOR CUFF,SUBACROMIAL DECOMPRESSION, DEBRIDMENT OF BURSA;  Surgeon: Michael Lights, MD;  Location: [place];  Service: Orthopedics;  Laterality: Right;  . Back surgery      There were no vitals filed for this visit.      Subjective Assessment - 03/13/16 0935    Subjective "I am having alittle more soreness today, but have been using the new orthotics"   Currently in Pain? Yes   Pain Score 2    Pain  Location Foot   Pain Orientation Right   Pain Descriptors / Indicators Sore   Pain Type Chronic pain   Pain Onset More than a month ago   Pain Frequency Intermittent            [facility] PT Assessment - 03/13/16 0001    Strength   Strength Assessment Site Hip;Knee   Right/Left Hip Right   Right Hip Flexion 4+/5   Right Hip Extension 4/5   Right Hip ABduction 4/5   Right Hip ADduction 5/5   Right/Left Knee Right   Right Knee Flexion 5/5   Left Knee Flexion 5/5                     [facility] Adult PT Treatment/Exercise - 03/13/16 0001    Knee/Hip Exercises: Standing   Heel Raises Both;1 set;20 reps  with with met heads on rolled up towel with inversion for    Manual Therapy   Manual therapy comments splaying of the foot performed concurrent during calf manual trigger point release and IASTM   Soft tissue mobilization IASTM over the R PF with great toe extension, twisting over the medial calcaneal tubercle. gastroc/ soleus with pt in prone and calf on stretch   Other Manual Therapy manual trigger point release over R the quadratus plantae, and abductor hallicus longus, and 2 x soleus and  2 x gastrocnemius   Ankle Exercises: Stretches   Slant Board Stretch 3 reps;60 seconds  with gastroc/soleues each   Ankle Exercises: Standing   Heel Raises 15 reps  x 2 sets with met heads on rolled towel for PF stretch                PT Education - 03/13/16 1059    Education provided Yes   Education Details progress with therapy and progressing to 1 x a week to work toward independent exercise and assess progress   Person(s) Educated Patient   Methods Explanation;Verbal cues   Comprehension Verbalized understanding          PT Short Term Goals - 03/04/16 1040    PT SHORT TERM GOAL #1   Title pt will be I with inital HEP (03/03/2016)   Time 3   Period Weeks   Status Achieved   PT SHORT TERM GOAL #2   Title pt will be able to verbalize and demonsrate techniques to  reduce R foot pain and inflammtion via RICE and stretching (03/03/2016)   Time 3   Period Weeks   Status Achieved           PT Long Term Goals - 03/13/16 1126    PT LONG TERM GOAL #1   Title pt will be I with all HEP as of last visit (03/24/2016)   Time 6   Period Weeks   Status On-going   PT LONG TERM GOAL #2   Title pt will improve R ankle DF AROM to >/= 6 degrees from neutral with </= 3/10 pain to demonstrate decrease tension in the calf and prevent tripping and catching of his foot on the ground for safety (03/24/2016)   Time 6   Period Weeks   Status On-going   PT LONG TERM GOAL #3   Title pt will improve R ankle 4-way strength to >/= 4+/5 in all planes and especially the posterior tibialis with </= 3/10 pain in all planes to promote support of the arch with walking and standing activites (03/24/2016)   Time 6   Period Weeks   Status On-going   PT LONG TERM GOAL #4   Title pt will report decrease tightness the calf to and decreased pain in the calf/ heel to promote decreased pain to </= 3/10 during and following walking/ running of >/= 45 min to assist with pt's personal goal of getting back to the gym ( 03/24/2016)   Time 6   Period Weeks   Status On-going   PT LONG TERM GOAL #5   Title pt will improve his LEFS score to >/= 60 to demonstrate improvement in function at discharge (03/24/2016)   Time 6   Period Weeks   Status On-going               Plan - 03/13/16 1121    Clinical Impression Statement Michael [name] states he is feeling a little more sore in the R foot today which may be attributed to using a new softer orthotic. Following manual of the R calf he reported decreased pain with standing and walking. peformed stretching and dynamic stretching of the Plantar fascia with heel raise and heel raise with ER. opted not to use Iontophoresis to assess his response to treatment and transition to 1 x a week. He is improving with hip strength.    PT Next Visit Plan manual  trigger point release, IASTM, hip strengthening, progress standing foot strengthening. LEFS, balance  training   Consulted and Agree with Plan of Care Patient      Patient will benefit from skilled therapeutic intervention in order to improve the following deficits and impairments:     Visit Diagnosis: Pain in right ankle and joints of right foot  Muscle weakness (generalized)  Other abnormalities of gait and mobility  Other symptoms and signs involving the musculoskeletal system  Cramp and spasm     Problem List Patient Active Problem List   Diagnosis Date Noted  . Acute deep vein thrombosis (DVT) of right lower extremity ([place]) 10/01/2015  . Sinusitis 10/01/2015  . Hypokalemia 10/01/2015  . DVT (deep venous thrombosis) ([place] ) 10/01/2015  . Encounter for therapeutic drug monitoring 08/03/2015  . Essential hypertension 07/29/2015  . Hyperlipidemia 07/29/2015  . Pneumonia 02/01/2014  . HIV disease ([place]) 02/01/2014  . Acute pulmonary embolism ([place]) 02/01/2014  . PE (pulmonary embolism) 01/31/2014  . Impingement syndrome of right shoulder 12/15/2013  . Sacroiliac joint pain 04/12/2013  . Pain in joint, lower leg 02/24/2013  . Pain in left hip 02/24/2013  . 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 Abnormal EKG 07/07/2011  . Preop cardiovascular exam 07/07/2011  . ABDOMINAL PAIN, EPIGASTRIC 03/09/2009  . UNSPECIFIED LABYRINTHITIS 01/24/2009  . FOOT PAIN, LEFT 11/03/2008  . 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 PLANTAR FASCIITIS, BILATERAL 09/26/2008  . Hepatitis B virus infection 10/18/2007  . VENEREAL WART 10/18/2007  . LIVER HEMANGIOMA 10/18/2007  . HYPERLIPIDEMIA 10/18/2007  . DEPRESSION 10/18/2007  . HAY FEVER 10/18/2007  . PNEUMONIA 10/18/2007  . ASTHMA, CHILDHOOD 10/18/2007  . GASTROESOPHAGEAL REFLUX DISEASE 10/18/2007  . Other acquired absence of organ 10/18/2007  . ROTATOR CUFF REPAIR, RIGHT, HX OF 10/18/2007  . HIV DISEASE 08/05/2007   Starr Lake PT, DPT, LAT, ATC  03/13/2016  11:27 AM      [facility] [address] [place], [place], [address] Phone: [phone]   Fax:  [phone]  Name: Michael [name] MRN: [id number] Date of Birth: 08-Oct-1955

## 2016-03-18 ENCOUNTER — Ambulatory Visit: Payer: Managed Care, Other (non HMO) | Admitting: Physical Therapy

## 2016-03-18 DIAGNOSIS — M25571 Pain in right ankle and joints of right foot: Secondary | ICD-10-CM | POA: Diagnosis not present

## 2016-03-18 DIAGNOSIS — R2689 Other abnormalities of gait and mobility: Secondary | ICD-10-CM

## 2016-03-18 DIAGNOSIS — R252 Cramp and spasm: Secondary | ICD-10-CM

## 2016-03-18 DIAGNOSIS — M6281 Muscle weakness (generalized): Secondary | ICD-10-CM

## 2016-03-18 DIAGNOSIS — R29898 Other symptoms and signs involving the musculoskeletal system: Secondary | ICD-10-CM

## 2016-03-18 NOTE — Therapy (Signed)
[place], [place], [address] Phone: [phone]   Fax:  [phone]  Physical Therapy Treatment  Patient Details  Name: Michael [name] MRN: [id number] Date of Birth: 12-Sep-1956 Referring Provider: Lilia Argue MD  Encounter Date: 03/18/2016      PT End of Session - 03/18/16 1242    Visit Number 9   Number of Visits 13   Date for PT Re-Evaluation 03/24/16   PT Start Time [id number]   PT Stop Time 1237   PT Time Calculation (min) 48 min   Activity Tolerance Patient tolerated treatment well   Behavior During Therapy [facility] for tasks assessed/performed      Past Medical History  Diagnosis Date  . HTN (hypertension)   . Hyperlipidemia   . HIV (human immunodeficiency virus infection) ([place])   . GERD (gastroesophageal reflux disease)   . Pulmonary embolism ([place]) 12/2013  . DVT (deep venous thrombosis) ([place]) 07/2015; 10/01/2015    RLE; RLE  . 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 Childhood asthma   . Pneumonia ~ 2000  . Hepatitis B   . Anxiety     Past Surgical History  Procedure Laterality Date  . Shoulder arthroscopy w/ rotator cuff repair Bilateral 2000's    "twice on right; once on the left"  . Anterior cervical decomp/discectomy fusion  2010    C40-C5  . Appendectomy    . Tonsillectomy    . Colonoscopy    . Shoulder arthroscopy with subacromial decompression Right 12/15/2013    Procedure: RIGHT SHOULDER ARTHROSCOPY WITH EXTENSIVE DEBRIDEMENT OF ROTATOR CUFF,SUBACROMIAL DECOMPRESSION, DEBRIDMENT OF BURSA;  Surgeon: Ninetta Lights, MD;  Location: [place];  Service: Orthopedics;  Laterality: Right;  . Back surgery      There were no vitals filed for this visit.      Subjective Assessment - 03/18/16 1152    Subjective "I am alittle sore today, I had a blister from spilling coffee on my foot" pt reports bottom of the foot is feeling okay today and has been working out 6 x a  week.    Currently in Pain? Yes   Pain Score 1    Pain Location Foot   Pain Orientation Right;Left   Pain Descriptors / Indicators Sore                         [facility] Adult PT Treatment/Exercise - 03/18/16 0001    Manual Therapy   Soft tissue mobilization IASTM over the B PF with great toe extension, twisting over the medial calcaneal tubercle. gastroc/ soleus with pt in prone and calf on stretch   Other Manual Therapy manual trigger point release over B the quadratus plantae, and abductor hallicus longus, and 2 x soleus and 2 x gastrocnemius   Ankle Exercises: Aerobic   Elliptical L5, elevation L 10 x 6 min   Ankle Exercises: Stretches   Slant Board Stretch 3 reps;30 seconds  with knees straight and knees bent   Ankle Exercises: Seated   Towel Crunch 5 reps;Weights  5# x 2, 3 reps burning out   Other Seated Ankle Exercises toe yoga 2 x 10 just with RLE only   Ankle Exercises: Standing   Toe Walk (Round Trip) 4 x 20 ft with toe walking             Balance Exercises - 03/18/16 1245    Balance Exercises: Standing   SLS Foam/compliant  surface;Eyes open;4 reps;30 secs  moderate postural sway bil           PT Education - 03/18/16 1244    Education provided Yes   Education Details single leg balance exercises to work on increase prioprioception as well as promote strengthening of to foot which can be done anywhere.    Person(s) Educated Patient   Methods Explanation   Comprehension Verbalized understanding          PT Short Term Goals - 03/04/16 1040    PT SHORT TERM GOAL #1   Title pt will be I with inital HEP (03/03/2016)   Time 3   Period Weeks   Status Achieved   PT SHORT TERM GOAL #2   Title pt will be able to verbalize and demonsrate techniques to reduce R foot pain and inflammtion via RICE and stretching (03/03/2016)   Time 3   Period Weeks   Status Achieved           PT Long Term Goals - 03/13/16 1126    PT LONG TERM GOAL #1    Title pt will be I with all HEP as of last visit (03/24/2016)   Time 6   Period Weeks   Status On-going   PT LONG TERM GOAL #2   Title pt will improve R ankle DF AROM to >/= 6 degrees from neutral with </= 3/10 pain to demonstrate decrease tension in the calf and prevent tripping and catching of his foot on the ground for safety (03/24/2016)   Time 6   Period Weeks   Status On-going   PT LONG TERM GOAL #3   Title pt will improve R ankle 4-way strength to >/= 4+/5 in all planes and especially the posterior tibialis with </= 3/10 pain in all planes to promote support of the arch with walking and standing activites (03/24/2016)   Time 6   Period Weeks   Status On-going   PT LONG TERM GOAL #4   Title pt will report decrease tightness the calf to and decreased pain in the calf/ heel to promote decreased pain to </= 3/10 during and following walking/ running of >/= 45 min to assist with pt's personal goal of getting back to the gym ( 03/24/2016)   Time 6   Period Weeks   Status On-going   PT LONG TERM GOAL #5   Title pt will improve his LEFS score to >/= 60 to demonstrate improvement in function at discharge (03/24/2016)   Time 6   Period Weeks   Status On-going               Plan - 03/18/16 1242    Clinical Impression Statement Mr. Gulbrandson reports being consistent with his exercises and only has some soreness in bil heels. work on standing balance and toe walking following manual which he reported no pain following. Plan to continue to work toward goals.    PT Next Visit Plan manual trigger point release, IASTM, hip strengthening, progress standing foot strengthening. LEFS, balance training, give SLS balance as HEP   Consulted and Agree with Plan of Care Patient      Patient will benefit from skilled therapeutic intervention in order to improve the following deficits and impairments:  Pain, Improper body mechanics, Postural dysfunction, Decreased strength, Decreased mobility,  Hypomobility, Decreased range of motion, Increased muscle spasms, Decreased endurance, Decreased activity tolerance, Decreased balance, Difficulty walking, Increased fascial restricitons  Visit Diagnosis: Pain in right ankle and joints of  right foot  Muscle weakness (generalized)  Other abnormalities of gait and mobility  Other symptoms and signs involving the musculoskeletal system  Cramp and spasm     Problem List Patient Active Problem List   Diagnosis Date Noted  . Acute deep vein thrombosis (DVT) of right lower extremity ([place]) 10/01/2015  . Sinusitis 10/01/2015  . Hypokalemia 10/01/2015  . DVT (deep venous thrombosis) ([place]) 10/01/2015  . Encounter for therapeutic drug monitoring 08/03/2015  . Essential hypertension 07/29/2015  . Hyperlipidemia 07/29/2015  . Pneumonia 02/01/2014  . HIV disease ([place]) 02/01/2014  . Acute pulmonary embolism ([place]) 02/01/2014  . PE (pulmonary embolism) 01/31/2014  . Impingement syndrome of right shoulder 12/15/2013  . Sacroiliac joint pain 04/12/2013  . Pain in joint, lower leg 02/24/2013  . Pain in left hip 02/24/2013  . 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 Abnormal EKG 07/07/2011  . Preop cardiovascular exam 07/07/2011  . ABDOMINAL PAIN, EPIGASTRIC 03/09/2009  . UNSPECIFIED LABYRINTHITIS 01/24/2009  . FOOT PAIN, LEFT 11/03/2008  . 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 PLANTAR FASCIITIS, BILATERAL 09/26/2008  . Hepatitis B virus infection 10/18/2007  . VENEREAL WART 10/18/2007  . LIVER HEMANGIOMA 10/18/2007  . HYPERLIPIDEMIA 10/18/2007  . DEPRESSION 10/18/2007  . HAY FEVER 10/18/2007  . PNEUMONIA 10/18/2007  . ASTHMA, CHILDHOOD 10/18/2007  . GASTROESOPHAGEAL REFLUX DISEASE 10/18/2007  . Other acquired absence of organ 10/18/2007  . ROTATOR CUFF REPAIR, RIGHT, HX OF 10/18/2007  . HIV DISEASE 08/05/2007   Starr Lake PT, DPT, LAT, ATC  03/18/2016  12:49 PM      [place]  [facility] [address] [place], [place], [address] Phone: [phone]   Fax:  [phone]  Name: Michael [name] MRN: [id number] Date of Birth: [birth year]

## 2016-03-24 ENCOUNTER — Ambulatory Visit: Payer: Managed Care, Other (non HMO) | Admitting: Physical Therapy

## 2016-03-24 DIAGNOSIS — M25571 Pain in right ankle and joints of right foot: Secondary | ICD-10-CM

## 2016-03-24 DIAGNOSIS — R252 Cramp and spasm: Secondary | ICD-10-CM

## 2016-03-24 DIAGNOSIS — R29898 Other symptoms and signs involving the musculoskeletal system: Secondary | ICD-10-CM

## 2016-03-24 DIAGNOSIS — M6281 Muscle weakness (generalized): Secondary | ICD-10-CM

## 2016-03-24 DIAGNOSIS — R2689 Other abnormalities of gait and mobility: Secondary | ICD-10-CM

## 2016-03-24 NOTE — Therapy (Signed)
[place], [place], [address] Phone: [phone]   Fax:  [phone]  Physical Therapy Treatment  Patient Details  Name: Michael [name] MRN: [id number] Date of Birth: 15-Feb-1956 Referring Provider: Lilia Argue MD  Encounter Date: 03/24/2016      Michael [name] End of Session - 03/24/16 1153    Visit Number 10   Number of Visits 13   Date for Michael [name] Re-Evaluation 03/24/16   Authorization Type Cigna   Michael [name] Start Time 1018   Michael [name] Stop Time 1105   Michael [name] Time Calculation (min) 47 min   Activity Tolerance Patient tolerated treatment well   Behavior During Therapy [facility] for tasks assessed/performed      Past Medical History  Diagnosis Date  . HTN (hypertension)   . Hyperlipidemia   . HIV (human immunodeficiency virus infection) ([place])   . GERD (gastroesophageal reflux disease)   . Pulmonary embolism ([place]) 12/2013  . DVT (deep venous thrombosis) ([place]) 07/2015; 10/01/2015    RLE; RLE  . 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 Childhood asthma   . Pneumonia ~ 2000  . Hepatitis B   . Anxiety     Past Surgical History  Procedure Laterality Date  . Shoulder arthroscopy w/ rotator cuff repair Bilateral 2000's    "twice on right; once on the left"  . Anterior cervical decomp/discectomy fusion  2010    C40-C5  . Appendectomy    . Tonsillectomy    . Colonoscopy    . Shoulder arthroscopy with subacromial decompression Right 12/15/2013    Procedure: RIGHT SHOULDER ARTHROSCOPY WITH EXTENSIVE DEBRIDEMENT OF ROTATOR CUFF,SUBACROMIAL DECOMPRESSION, DEBRIDMENT OF BURSA;  Surgeon: Ninetta Lights, MD;  Location: [place];  Service: Orthopedics;  Laterality: Right;  . Back surgery      There were no vitals filed for this visit.      Subjective Assessment - 03/24/16 1018    Subjective "a little more sore today from wearing driving shoes with orthotic"   Currently in Pain? Yes   Pain [name] 3    Pain Location  [name]   Pain Orientation Right   Pain Descriptors / Indicators Sore   Pain Type Chronic pain   Pain Onset More than a month ago   Pain Frequency Intermittent   Aggravating Factors  prolonged standing/ walking                          [facility] Adult Michael [name] Treatment/Exercise - 03/24/16 1025    Self-Care   Self-Care Other Self-Care Comments   Other Self-Care Comments  using rolling bar to rolling of PF and ball for trigger point release to relieve tension at home. using foam roll on calves to decrease tension on the calves.    Manual Therapy   Other Manual Therapy manual trigger point release over B the quadratus plantae, and abductor hallicus longus, and 2 x soleus and 2 x gastrocnemius   Ankle Exercises: Aerobic   Elliptical L5, elevation L 10 x 6 min   Ankle Exercises: Stretches   Plantar Fascia Stretch 4 reps;30 seconds   Slant Board Stretch 3 reps;30 seconds  3 x each with gastroc/ soleus                Michael [name] Education - 03/24/16 1152    Education provided Yes   Education Details utilizing tools to relieve tension of the muscles at home for manual trigger point release of  th eplantar surface of the [name], and foam rolling for the [name]   Person(s) Educated Patient   Methods Explanation;Verbal cues;Tactile cues   Comprehension Verbalized understanding;Verbal cues required;Tactile cues required          Michael [name] Short Term Goals - 03/04/16 1040    Michael [name] SHORT TERM [name] #1   Title Michael [name] will be I with inital HEP (03/03/2016)   Time 3   Period Weeks   Status Achieved   Michael [name] SHORT TERM [name] #2   Title Michael [name] will be able to verbalize and demonsrate techniques to reduce R [name] pain and inflammtion via RICE and stretching (03/03/2016)   Time 3   Period Weeks   Status Achieved           Michael [name] Long Term Goals - 03/13/16 1126    Michael [name] LONG TERM [name] #1   Title Michael [name] will be I with all HEP as of last visit (03/24/2016)   Time 6   Period Weeks   Status On-going   Michael [name] LONG TERM [name] #2    Title Michael [name] will improve R ankle DF AROM to >/= 6 degrees from neutral with </= 3/10 pain to demonstrate decrease tension in the [name] and prevent tripping and catching of Michael [name] on the ground for safety (03/24/2016)   Time 6   Period Weeks   Status On-going   Michael [name] LONG TERM [name] #3   Title Michael [name] will improve R ankle 4-way strength to >/= 4+/5 in all planes and especially the posterior tibialis with </= 3/10 pain in all planes to promote support of the arch with walking and standing activites (03/24/2016)   Time 6   Period Weeks   Status On-going   Michael [name] LONG TERM [name] #4   Title Michael [name] will report decrease tightness the [name] to and decreased pain in the [name]/ heel to promote decreased pain to </= 3/10 during and following walking/ running of >/= 45 min to assist with Michael [name] of getting back to the gym ( 03/24/2016)   Time 6   Period Weeks   Status On-going   Michael [name] LONG TERM [name] #5   Title Michael [name] will improve Michael [name] to >/= 60 to demonstrate improvement in function at discharge (03/24/2016)   Time 6   Period Weeks   Status On-going               Plan - 03/24/16 1153    Clinical Impression Statement Michael [name] reported increased soreness today. focused today ssession for self care for self manual to calm down tension as needed at home using tools. Michael [name] reported some relief but reported most relief with Michael [name] manual technique along the quadratus plantae.    Michael [name] Next Visit Plan manual trigger point release, IASTM, hip strengthening, progress standing [name] strengthening. LEFS, balance training, give SLS balance as HEP, Recertification   Michael [name] Home Exercise Plan self0care manual relief techniques   Consulted and Agree with Plan of Care Patient      Patient will benefit from skilled therapeutic intervention in order to improve the following deficits and impairments:  Pain, Improper body mechanics, Postural dysfunction, Decreased strength, Decreased mobility, Hypomobility, Decreased range of motion,  Increased muscle spasms, Decreased endurance, Decreased activity tolerance, Decreased balance, Difficulty walking, Increased fascial restricitons  Visit Diagnosis: Pain in right ankle and joints of right [name]  Other abnormalities of gait and mobility  Other symptoms and signs involving the musculoskeletal system  Cramp and spasm  Muscle weakness (  generalized)     Problem List Patient Active Problem List   Diagnosis Date Noted  . Acute deep vein thrombosis (DVT) of right lower extremity ([place]) 10/01/2015  . Sinusitis 10/01/2015  . Hypokalemia 10/01/2015  . DVT (deep venous thrombosis) ([place]) 10/01/2015  . Encounter for therapeutic drug monitoring 08/03/2015  . Essential hypertension 07/29/2015  . Hyperlipidemia 07/29/2015  . Pneumonia 02/01/2014  . HIV disease ([place]) 02/01/2014  . Acute pulmonary embolism ([place]) 02/01/2014  . PE (pulmonary embolism) 01/31/2014  . Impingement syndrome of right shoulder 12/15/2013  . Sacroiliac joint pain 04/12/2013  . Pain in joint, lower leg 02/24/2013  . Pain in left hip 02/24/2013  . 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 Abnormal EKG 07/07/2011  . Preop cardiovascular exam 07/07/2011  . ABDOMINAL PAIN, EPIGASTRIC 03/09/2009  . UNSPECIFIED LABYRINTHITIS 01/24/2009  . [name] PAIN, LEFT 11/03/2008  . 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 PLANTAR FASCIITIS, BILATERAL 09/26/2008  . Hepatitis B virus infection 10/18/2007  . VENEREAL WART 10/18/2007  . LIVER HEMANGIOMA 10/18/2007  . HYPERLIPIDEMIA 10/18/2007  . DEPRESSION 10/18/2007  . HAY FEVER 10/18/2007  . PNEUMONIA 10/18/2007  . ASTHMA, CHILDHOOD 10/18/2007  . GASTROESOPHAGEAL REFLUX DISEASE 10/18/2007  . Other acquired absence of organ 10/18/2007  . ROTATOR CUFF REPAIR, RIGHT, HX OF 10/18/2007  . HIV DISEASE 08/05/2007   Starr Lake Michael [name], DPT, LAT, ATC  03/24/2016  11:57 AM      [facility] [address] [place], [place], [address] Phone: [phone]   Fax:  [phone]  Name: Michael [name] MRN: [id number] Date of Birth: 1956-02-02

## 2016-04-01 ENCOUNTER — Ambulatory Visit: Payer: Managed Care, Other (non HMO) | Admitting: Physical Therapy

## 2016-04-01 DIAGNOSIS — M25571 Pain in right ankle and joints of right foot: Secondary | ICD-10-CM | POA: Diagnosis not present

## 2016-04-01 DIAGNOSIS — M6281 Muscle weakness (generalized): Secondary | ICD-10-CM

## 2016-04-01 DIAGNOSIS — R252 Cramp and spasm: Secondary | ICD-10-CM

## 2016-04-01 DIAGNOSIS — R29898 Other symptoms and signs involving the musculoskeletal system: Secondary | ICD-10-CM

## 2016-04-01 DIAGNOSIS — R2689 Other abnormalities of gait and mobility: Secondary | ICD-10-CM

## 2016-04-01 NOTE — Therapy (Signed)
[place], [place], [address] Phone: [phone]   Fax:  [phone]  Physical Therapy Treatment / Re-certification  Patient Details  Name: Michael [name] MRN: [id number] Date of Birth: 02/12/1956 Referring Provider: Lilia Argue MD  Encounter Date: 04/01/2016      PT End of Session - 04/01/16 1304    Visit Number 11   Number of Visits 13   Date for PT Re-Evaluation 04/15/16   Authorization Type Cigna   PT Start Time 1020   PT Stop Time 1102   PT Time Calculation (min) 42 min   Activity Tolerance Patient tolerated treatment well   Behavior During Therapy [facility] for tasks assessed/performed      Past Medical History  Diagnosis Date  . HTN (hypertension)   . Hyperlipidemia   . HIV (human immunodeficiency virus infection) ([place])   . GERD (gastroesophageal reflux disease)   . Pulmonary embolism ([place]) 12/2013  . DVT (deep venous thrombosis) ([place]) 07/2015; 10/01/2015    RLE; RLE  . 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 Childhood asthma   . Pneumonia ~ 2000  . Hepatitis B   . Anxiety     Past Surgical History  Procedure Laterality Date  . Shoulder arthroscopy w/ rotator cuff repair Bilateral 2000's    "twice on right; once on the left"  . Anterior cervical decomp/discectomy fusion  2010    C40-C5  . Appendectomy    . Tonsillectomy    . Colonoscopy    . Shoulder arthroscopy with subacromial decompression Right 12/15/2013    Procedure: RIGHT SHOULDER ARTHROSCOPY WITH EXTENSIVE DEBRIDEMENT OF ROTATOR CUFF,SUBACROMIAL DECOMPRESSION, DEBRIDMENT OF BURSA;  Surgeon: Michael Lights, MD;  Location: [place];  Service: Orthopedics;  Laterality: Right;  . Back surgery      There were no vitals filed for this visit.      Subjective Assessment - 04/01/16 1022    Subjective "I am doing well, no pain today"    Currently in Pain? No/denies            [facility] PT Assessment -  04/01/16 0001    AROM   Right Ankle Dorsiflexion 5   Right Ankle Plantar Flexion 52   Strength   Right Hip Flexion 4+/5   Right Hip Extension 4+/5   Right Hip ABduction 4+/5   Right Hip ADduction 5/5   Left Hip Flexion 4+/5   Left Hip Extension 4+/5   Right Knee Flexion 5/5   Left Knee Flexion 5/5   Right Ankle Dorsiflexion 4+/5   Right Ankle Plantar Flexion 4+/5   Right Ankle Inversion 4+/5   Right Ankle Eversion 4+/5   Left Ankle Dorsiflexion 5/5   Left Ankle Plantar Flexion 5/5   Left Ankle Inversion 5/5   Left Ankle Eversion 5/5                     [facility] Adult PT Treatment/Exercise - 04/01/16 1026    Ankle Exercises: Aerobic   Elliptical L8, elevation L 10 x 10 min   Ankle Exercises: Stretches   Plantar Fascia Stretch 2 reps;30 seconds   Slant Board Stretch 30 seconds;4 reps  on 2nd hightest ramp   Ankle Exercises: Standing   Rocker Board 4 minutes  inv/eversion and PF/DF    Rebounder x 10 attempted SLS on R with red ball goal was 10 tosses  best was 8 tosses   Heel Raises 15 reps  with  toes on elevated surface for dynamic stretch of PF   Ankle Exercises: Seated   Towel Crunch 3 reps  standing towel scrunch                  PT Short Term Goals - 03/04/16 1040    PT SHORT TERM GOAL #1   Title pt will be I with inital HEP (03/03/2016)   Time 3   Period Weeks   Status Achieved   PT SHORT TERM GOAL #2   Title pt will be able to verbalize and demonsrate techniques to reduce R foot pain and inflammtion via RICE and stretching (03/03/2016)   Time 3   Period Weeks   Status Achieved           PT Long Term Goals - 04/01/16 1311    PT LONG TERM GOAL #1   Title pt will be I with all HEP as of last visit (03/24/2016)   Time 6   Period Weeks   Status On-going   PT LONG TERM GOAL #2   Title pt will improve R ankle DF AROM to >/= 6 degrees from neutral with </= 3/10 pain to demonstrate decrease tension in the calf and prevent tripping and  catching of his foot on the ground for safety (03/24/2016)   Time 6   Period Weeks   Status On-going   PT LONG TERM GOAL #3   Title pt will improve R ankle 4-way strength to >/= 4+/5 in all planes and especially the posterior tibialis with </= 3/10 pain in all planes to promote support of the arch with walking and standing activites (03/24/2016)   Time 6   Period Weeks   Status On-going   PT LONG TERM GOAL #4   Title pt will report decrease tightness the calf to and decreased pain in the calf/ heel to promote decreased pain to </= 3/10 during and following walking/ running of >/= 45 min to assist with pt's personal goal of getting back to the gym ( 03/24/2016)   Time 6   Period Weeks   Status On-going   PT LONG TERM GOAL #5   Title pt will improve his LEFS score to >/= 60 to demonstrate improvement in function at discharge (03/24/2016)   Time 6   Period Weeks   Status On-going               Plan - 04/01/16 1305    Clinical Impression Statement Michael [name] continues to make great progress with physical therapy improving. He has demonstrated improvement with mobility of the R ankle and improvement of strength. Focused today on strength, balance and stretching which he had difficulty with SLS on the R but rpeorted no pain. plan to assess pt next visit due to no manual intervention today, if everything is going well then plan to discharge next visit.    PT Next Visit Plan  LEFS, balance training, give SLS balance as HEP,    Consulted and Agree with Plan of Care Patient      Patient will benefit from skilled therapeutic intervention in order to improve the following deficits and impairments:  Pain, Improper body mechanics, Postural dysfunction, Decreased strength, Decreased mobility, Hypomobility, Decreased range of motion, Increased muscle spasms, Decreased endurance, Decreased activity tolerance, Decreased balance, Difficulty walking, Increased fascial restricitons  Visit  Diagnosis: Pain in right ankle and joints of right foot  Other abnormalities of gait and mobility  Other symptoms and signs involving the musculoskeletal system  Cramp and spasm  Muscle weakness (generalized)     Problem List Patient Active Problem List   Diagnosis Date Noted  . Acute deep vein thrombosis (DVT) of right lower extremity ([place]) 10/01/2015  . Sinusitis 10/01/2015  . Hypokalemia 10/01/2015  . DVT (deep venous thrombosis) ([place]) 10/01/2015  . Encounter for therapeutic drug monitoring 08/03/2015  . Essential hypertension 07/29/2015  . Hyperlipidemia 07/29/2015  . Pneumonia 02/01/2014  . HIV disease ([place]) 02/01/2014  . Acute pulmonary embolism ([place]) 02/01/2014  . PE (pulmonary embolism) 01/31/2014  . Impingement syndrome of right shoulder 12/15/2013  . Sacroiliac joint pain 04/12/2013  . Pain in joint, lower leg 02/24/2013  . Pain in left hip 02/24/2013  . 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 Abnormal EKG 07/07/2011  . Preop cardiovascular exam 07/07/2011  . ABDOMINAL PAIN, EPIGASTRIC 03/09/2009  . UNSPECIFIED LABYRINTHITIS 01/24/2009  . FOOT PAIN, LEFT 11/03/2008  . 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 PLANTAR FASCIITIS, BILATERAL 09/26/2008  . Hepatitis B virus infection 10/18/2007  . VENEREAL WART 10/18/2007  . LIVER HEMANGIOMA 10/18/2007  . HYPERLIPIDEMIA 10/18/2007  . DEPRESSION 10/18/2007  . HAY FEVER 10/18/2007  . PNEUMONIA 10/18/2007  . ASTHMA, CHILDHOOD 10/18/2007  . GASTROESOPHAGEAL REFLUX DISEASE 10/18/2007  . Other acquired absence of organ 10/18/2007  . ROTATOR CUFF REPAIR, RIGHT, HX OF 10/18/2007  . HIV DISEASE 08/05/2007   Starr Lake PT, DPT, LAT, ATC  04/01/2016  1:12 PM      [facility] [address] [place], [place], [address] Phone: [phone]   Fax:  [phone]  Name: Michael [name] MRN: [id number] Date of Birth: [birth year]

## 2016-04-10 ENCOUNTER — Ambulatory Visit: Payer: Managed Care, Other (non HMO) | Attending: Family Medicine | Admitting: Physical Therapy

## 2016-04-10 DIAGNOSIS — R2689 Other abnormalities of gait and mobility: Secondary | ICD-10-CM | POA: Diagnosis present

## 2016-04-10 DIAGNOSIS — R29898 Other symptoms and signs involving the musculoskeletal system: Secondary | ICD-10-CM | POA: Diagnosis present

## 2016-04-10 DIAGNOSIS — R252 Cramp and spasm: Secondary | ICD-10-CM | POA: Diagnosis present

## 2016-04-10 DIAGNOSIS — M25571 Pain in right ankle and joints of right foot: Secondary | ICD-10-CM | POA: Diagnosis present

## 2016-04-10 DIAGNOSIS — M6281 Muscle weakness (generalized): Secondary | ICD-10-CM

## 2016-04-10 NOTE — Therapy (Signed)
[place], [place], [address] Phone: [phone]   Fax:  [phone]  Physical Therapy Treatment / Discharge Note  Patient Details  Name: Michael [name] MRN: [id number] Date of Birth: 12/12/1955 Referring Provider: Lilia Argue MD  Encounter Date: 04/10/2016      PT End of Session - 04/10/16 1052    Visit Number 12   Number of Visits 13   Date for PT Re-Evaluation 04/15/16   Authorization Type Cigna   PT Start Time 1015  shortened visit due to discharge   PT Stop Time 0300   PT Time Calculation (min) 1005 min   Activity Tolerance Patient tolerated treatment well   Behavior During Therapy [facility] for tasks assessed/performed      Past Medical History  Diagnosis Date  . HTN (hypertension)   . Hyperlipidemia   . HIV (human immunodeficiency virus infection) ([place])   . GERD (gastroesophageal reflux disease)   . Pulmonary embolism ([place]) 12/2013  . DVT (deep venous thrombosis) ([place]) 07/2015; 10/01/2015    RLE; RLE  . 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 Childhood asthma   . Pneumonia ~ 2000  . Hepatitis B   . Anxiety     Past Surgical History  Procedure Laterality Date  . Shoulder arthroscopy w/ rotator cuff repair Bilateral 2000's    "twice on right; once on the left"  . Anterior cervical decomp/discectomy fusion  2010    C40-C5  . Appendectomy    . Tonsillectomy    . Colonoscopy    . Shoulder arthroscopy with subacromial decompression Right 12/15/2013    Procedure: RIGHT SHOULDER ARTHROSCOPY WITH EXTENSIVE DEBRIDEMENT OF ROTATOR CUFF,SUBACROMIAL DECOMPRESSION, DEBRIDMENT OF BURSA;  Surgeon: Michael Lights, MD;  Location: [place];  Service: Orthopedics;  Laterality: Right;  . Back surgery      There were no vitals filed for this visit.      Subjective Assessment - 04/10/16 1019    Subjective "everything is going well"    Currently in Pain? No/denies             [facility] PT Assessment - 04/10/16 0001    Observation/Other Assessments   Lower Extremity Functional Scale  80/80   AROM   Right Ankle Dorsiflexion 8   Right Ankle Plantar Flexion 55   Right Ankle Inversion 28   Right Ankle Eversion 12  pt kept rotating the knee   Strength   Right Hip Flexion 5/5   Right Hip Extension 5/5   Right Hip ABduction 4+/5   Right Ankle Dorsiflexion 5/5   Right Ankle Plantar Flexion 5/5   Right Ankle Inversion 5/5   Right Ankle Eversion 5/5                     [facility] Adult PT Treatment/Exercise - 04/10/16 0001    Ankle Exercises: Stretches   Plantar Fascia Stretch 2 reps;30 seconds   Slant Board Stretch 30 seconds;4 reps   Ankle Exercises: Standing   Other Standing Ankle Exercises toe yoga/ towel scrunches x 10 each                PT Education - 04/10/16 1050    Education provided Yes   Education Details Reviewed HEP and discussed at length how he can challenge his HEP making them harder by adding reps/ sets or perform exercsies such as toe yoga and towel scrunches instanding. starting light running activities utilizing interval traininig to  give adequate breaks, or uinsg HIIT activities to promote cardio training techniques.    Person(s) Educated Patient   Methods Explanation;Verbal cues   Comprehension Verbalized understanding          PT Short Term Goals - 03/04/16 1040    PT SHORT TERM GOAL #1   Title pt will be I with inital HEP (03/03/2016)   Time 3   Period Weeks   Status Achieved   PT SHORT TERM GOAL #2   Title pt will be able to verbalize and demonsrate techniques to reduce R foot pain and inflammtion via RICE and stretching (03/03/2016)   Time 3   Period Weeks   Status Achieved           PT Long Term Goals - 04/10/16 1032    PT LONG TERM GOAL #1   Title pt will be I with all HEP as of last visit (03/24/2016)   Time 6   Period Weeks   Status Achieved   PT LONG TERM GOAL #2   Title pt will improve R ankle  DF AROM to >/= 6 degrees from neutral with </= 3/10 pain to demonstrate decrease tension in the calf and prevent tripping and catching of his foot on the ground for safety (03/24/2016)   Time 6   Period Weeks   Status Achieved   PT LONG TERM GOAL #3   Title pt will improve R ankle 4-way strength to >/= 4+/5 in all planes and especially the posterior tibialis with </= 3/10 pain in all planes to promote support of the arch with walking and standing activites (03/24/2016)   Time 6   Period Weeks   Status Achieved   PT LONG TERM GOAL #4   Title pt will report decrease tightness the calf to and decreased pain in the calf/ heel to promote decreased pain to </= 3/10 during and following walking/ running of >/= 45 min to assist with pt's personal goal of getting back to the gym ( 03/24/2016)   Time 6   Period Weeks   Status Achieved   PT LONG TERM GOAL #5   Title pt will improve his LEFS score to >/= 60 to demonstrate improvement in function at discharge (03/24/2016)   Baseline 80/80   Time 6   Period Weeks   Status Achieved               Plan - 04/10/16 1052    Clinical Impression Statement Michael [name] has made great progress with physical therapy. He reported being pain free for the last week, and has only had a small twinge in the R foot. He improve R foot AROM and strength. He met all goals. Pt reports he is able to maintain and progress his current level of function independently and will be discharged from PT today.    PT Next Visit Plan d/C   PT Home Exercise Plan HEP review and exercies techniques for cardio/ running    Consulted and Agree with Plan of Care Patient      Patient will benefit from skilled therapeutic intervention in order to improve the following deficits and impairments:  Pain, Improper body mechanics, Postural dysfunction, Decreased strength, Decreased mobility, Hypomobility, Decreased range of motion, Increased muscle spasms, Decreased endurance, Decreased activity  tolerance, Decreased balance, Difficulty walking, Increased fascial restricitons  Visit Diagnosis: Pain in right ankle and joints of right foot  Other abnormalities of gait and mobility  Other symptoms and signs involving the musculoskeletal system  Muscle weakness (generalized)  Cramp and spasm     Problem List Patient Active Problem List   Diagnosis Date Noted  . Acute deep vein thrombosis (DVT) of right lower extremity ([place]) 10/01/2015  . Sinusitis 10/01/2015  . Hypokalemia 10/01/2015  . DVT (deep venous thrombosis) ([place]) 10/01/2015  . Encounter for therapeutic drug monitoring 08/03/2015  . Essential hypertension 07/29/2015  . Hyperlipidemia 07/29/2015  . Pneumonia 02/01/2014  . HIV disease ([place]) 02/01/2014  . Acute pulmonary embolism ([place]) 02/01/2014  . PE (pulmonary embolism) 01/31/2014  . Impingement syndrome of right shoulder 12/15/2013  . Sacroiliac joint pain 04/12/2013  . Pain in joint, lower leg 02/24/2013  . Pain in left hip 02/24/2013  . 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 Abnormal EKG 07/07/2011  . Preop cardiovascular exam 07/07/2011  . ABDOMINAL PAIN, EPIGASTRIC 03/09/2009  . UNSPECIFIED LABYRINTHITIS 01/24/2009  . FOOT PAIN, LEFT 11/03/2008  . 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 PLANTAR FASCIITIS, BILATERAL 09/26/2008  . Hepatitis B virus infection 10/18/2007  . VENEREAL WART 10/18/2007  . LIVER HEMANGIOMA 10/18/2007  . HYPERLIPIDEMIA 10/18/2007  . DEPRESSION 10/18/2007  . HAY FEVER 10/18/2007  . PNEUMONIA 10/18/2007  . ASTHMA, CHILDHOOD 10/18/2007  . GASTROESOPHAGEAL REFLUX DISEASE 10/18/2007  . Other acquired absence of organ 10/18/2007  . ROTATOR CUFF REPAIR, RIGHT, HX OF 10/18/2007  . HIV DISEASE 08/05/2007   Starr Lake PT, DPT, LAT, ATC  04/10/2016  10:56 AM       [facility] [address] [place], [place], [address] Phone: [phone]   Fax:   [phone]  Name: Michael [name] MRN: [id number] Date of Birth: Jul 16, 1956    PHYSICAL THERAPY DISCHARGE SUMMARY  Visits from Start of Care: 12  Current functional level related to goals / functional outcomes: LEFS 80/80   Remaining deficits: Intermittent twinge of pain in the R foot.    Education / Equipment: HEP, theraband for strengthening, balance training, education regarding anatomy of the muscle involved with PF  Plan: Patient agrees to discharge.  Patient goals were met. Patient is being discharged due to meeting the stated rehab goals.  ?????

## 2016-04-22 ENCOUNTER — Ambulatory Visit (INDEPENDENT_AMBULATORY_CARE_PROVIDER_SITE_OTHER): Payer: Managed Care, Other (non HMO) | Admitting: Licensed Clinical Social Worker

## 2016-04-22 DIAGNOSIS — F419 Anxiety disorder, unspecified: Secondary | ICD-10-CM

## 2016-05-06 ENCOUNTER — Ambulatory Visit: Payer: Managed Care, Other (non HMO) | Admitting: Licensed Clinical Social Worker

## 2016-05-27 ENCOUNTER — Other Ambulatory Visit: Payer: Self-pay | Admitting: Oncology

## 2016-05-27 ENCOUNTER — Emergency Department (HOSPITAL_BASED_OUTPATIENT_CLINIC_OR_DEPARTMENT_OTHER)
Admit: 2016-05-27 | Discharge: 2016-05-27 | Disposition: A | Discharge status: Home / Self Care | Payer: Managed Care, Other (non HMO) | Attending: Emergency Medicine | Admitting: Emergency Medicine

## 2016-05-27 ENCOUNTER — Emergency Department (HOSPITAL_COMMUNITY)
Admission: EM | Admit: 2016-05-27 | Discharge: 2016-05-27 | Disposition: A | Discharge status: Home / Self Care | Payer: Managed Care, Other (non HMO) | Attending: Emergency Medicine | Admitting: Emergency Medicine

## 2016-05-27 ENCOUNTER — Emergency Department (HOSPITAL_COMMUNITY): Payer: Managed Care, Other (non HMO)

## 2016-05-27 ENCOUNTER — Encounter (HOSPITAL_COMMUNITY): Payer: Self-pay | Admitting: Emergency Medicine

## 2016-05-27 DIAGNOSIS — I10 Essential (primary) hypertension: Secondary | ICD-10-CM | POA: Insufficient documentation

## 2016-05-27 DIAGNOSIS — I82401 Acute embolism and thrombosis of unspecified deep veins of right lower extremity: Secondary | ICD-10-CM | POA: Insufficient documentation

## 2016-05-27 DIAGNOSIS — Z7901 Long term (current) use of anticoagulants: Secondary | ICD-10-CM | POA: Diagnosis not present

## 2016-05-27 DIAGNOSIS — Z86718 Personal history of other venous thrombosis and embolism: Secondary | ICD-10-CM | POA: Diagnosis not present

## 2016-05-27 DIAGNOSIS — M7989 Other specified soft tissue disorders: Secondary | ICD-10-CM | POA: Diagnosis present

## 2016-05-27 DIAGNOSIS — J45909 Unspecified asthma, uncomplicated: Secondary | ICD-10-CM | POA: Insufficient documentation

## 2016-05-27 DIAGNOSIS — I82441 Acute embolism and thrombosis of right tibial vein: Secondary | ICD-10-CM

## 2016-05-27 DIAGNOSIS — R609 Edema, unspecified: Secondary | ICD-10-CM

## 2016-05-27 LAB — CBC WITH DIFFERENTIAL/PLATELET
BASOS ABS: 0 10*3/uL (ref 0.0–0.1)
BASOS PCT: 0 %
EOS ABS: 0.1 10*3/uL (ref 0.0–0.7)
EOS PCT: 1 %
HCT: 44.6 % (ref 39.0–52.0)
Hemoglobin: 15.8 g/dL (ref 13.0–17.0)
LYMPHS ABS: 1.8 10*3/uL (ref 0.7–4.0)
Lymphocytes Relative: 28 %
MCH: 33.8 pg (ref 26.0–34.0)
MCHC: 35.4 g/dL (ref 30.0–36.0)
MCV: 95.5 fL (ref 78.0–100.0)
Monocytes Absolute: 0.4 10*3/uL (ref 0.1–1.0)
Monocytes Relative: 6 %
NEUTROS PCT: 65 %
Neutro Abs: 4.1 10*3/uL (ref 1.7–7.7)
PLATELETS: 119 10*3/uL — AB (ref 150–400)
RBC: 4.67 MIL/uL (ref 4.22–5.81)
RDW: 13.1 % (ref 11.5–15.5)
WBC: 6.4 10*3/uL (ref 4.0–10.5)

## 2016-05-27 LAB — APTT: aPTT: 27 seconds (ref 24–36)

## 2016-05-27 LAB — COMPREHENSIVE METABOLIC PANEL
ALK PHOS: 35 U/L — AB (ref 38–126)
ALT: 23 U/L (ref 17–63)
AST: 25 U/L (ref 15–41)
Albumin: 3.8 g/dL (ref 3.5–5.0)
Anion gap: 7 (ref 5–15)
BILIRUBIN TOTAL: 1 mg/dL (ref 0.3–1.2)
BUN: 14 mg/dL (ref 6–20)
CALCIUM: 9.4 mg/dL (ref 8.9–10.3)
CO2: 27 mmol/L (ref 22–32)
CREATININE: 1.39 mg/dL — AB (ref 0.61–1.24)
Chloride: 103 mmol/L (ref 101–111)
GFR, EST NON AFRICAN AMERICAN: 53 mL/min — AB (ref >60)
Glucose, Bld: 102 mg/dL — ABNORMAL HIGH (ref 65–99)
Potassium: 3.9 mmol/L (ref 3.5–5.1)
Sodium: 137 mmol/L (ref 135–145)
TOTAL PROTEIN: 6.1 g/dL — AB (ref 6.5–8.1)

## 2016-05-27 LAB — HEPARIN ANTI-XA: HEPARIN LMW: 0.45 [IU]/mL

## 2016-05-27 LAB — PROTIME-INR
INR: 1.09
PROTHROMBIN TIME: 14.2 s (ref 11.4–15.2)

## 2016-05-27 MED ORDER — FONDAPARINUX SODIUM 10 MG/0.8ML ~~LOC~~ SOLN: 10.0000 mg | SUBCUTANEOUS | 0 refills | Status: DC

## 2016-05-27 MED ORDER — FONDAPARINUX SODIUM 10 MG/0.8ML ~~LOC~~ SOLN
10.0000 mg | Freq: Once | SUBCUTANEOUS | Status: AC
Start: 2016-05-27 — End: 2016-05-27
  Administered 2016-05-27: 10 mg via SUBCUTANEOUS
  Filled 2016-05-27: qty 0.8

## 2016-05-27 NOTE — ED Provider Notes (Signed)
[facility] DEPT Provider Note   CSN: [id number] Arrival date & time: 05/27/16  0535     History   Chief Complaint Chief Complaint  Patient presents with  . Ankle Pain    HPI Michael [name] is a 60 y.o. male.  HPI   Michael [name] is a 60 y.o. male, with a history of DVTs, PEs, HIV, and HTN, presenting to the ED with right sided ankle and foot swelling that began this morning. Patient states he is concerned about a DVT. History of multiple PE and DVTs. Anticoagulation since last October. Denies extremity pain, neuro deficits, fever/chills, shortness of breath, chest pain, or any other complaints.  Last week viral load undetectable and CD4 count normal.   Past Medical History:  Diagnosis Date  . Anxiety   . Childhood asthma   . DVT (deep venous thrombosis) ([place]) 07/2015; 10/01/2015   RLE; RLE  . GERD (gastroesophageal reflux disease)   . Hepatitis B   . HIV (human immunodeficiency virus infection) ([place])   . HTN (hypertension)   . Hyperlipidemia   . Pneumonia ~ 2000  . Pulmonary embolism ([place]) 12/2013  . 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 Acute deep vein thrombosis (DVT) of right lower extremity ([place]) 10/01/2015  . Sinusitis 10/01/2015  . Hypokalemia 10/01/2015  . DVT (deep venous thrombosis) ([place]) 10/01/2015  . Encounter for therapeutic drug monitoring 08/03/2015  . Essential hypertension 07/29/2015  . Hyperlipidemia 07/29/2015  . Pneumonia 02/01/2014  . HIV disease ([place]) 02/01/2014  . Acute pulmonary embolism ([place]) 02/01/2014  . PE (pulmonary embolism) 01/31/2014  . Impingement syndrome of right shoulder 12/15/2013  . Sacroiliac joint pain 04/12/2013  . Pain in joint, lower leg 02/24/2013  . Pain in left hip 02/24/2013  . 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 Abnormal EKG 07/07/2011  . Preop cardiovascular exam 07/07/2011  . ABDOMINAL PAIN, EPIGASTRIC 03/09/2009  . UNSPECIFIED  LABYRINTHITIS 01/24/2009  . FOOT PAIN, LEFT 11/03/2008  . 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 PLANTAR FASCIITIS, BILATERAL 09/26/2008  . Hepatitis B virus infection 10/18/2007  . VENEREAL WART 10/18/2007  . LIVER HEMANGIOMA 10/18/2007  . HYPERLIPIDEMIA 10/18/2007  . DEPRESSION 10/18/2007  . HAY FEVER 10/18/2007  . PNEUMONIA 10/18/2007  . ASTHMA, CHILDHOOD 10/18/2007  . GASTROESOPHAGEAL REFLUX DISEASE 10/18/2007  . Other acquired absence of organ 10/18/2007  . ROTATOR CUFF REPAIR, RIGHT, HX OF 10/18/2007  . HIV DISEASE 08/05/2007    Past Surgical History:  Procedure Laterality Date  . ANTERIOR CERVICAL DECOMP/DISCECTOMY FUSION  2010   C40-C5  . APPENDECTOMY    . BACK SURGERY    . COLONOSCOPY    . SHOULDER ARTHROSCOPY W/ ROTATOR CUFF REPAIR Bilateral 2000's   "twice on right; once on the left"  . SHOULDER ARTHROSCOPY WITH SUBACROMIAL DECOMPRESSION Right 12/15/2013   Procedure: RIGHT SHOULDER ARTHROSCOPY WITH EXTENSIVE DEBRIDEMENT OF ROTATOR CUFF,SUBACROMIAL DECOMPRESSION, DEBRIDMENT OF BURSA;  Surgeon: Ninetta Lights, MD;  Location: [facility];  Service: Orthopedics;  Laterality: Right;  . TONSILLECTOMY         Home Medications    Prior to Admission medications   Medication Sig Start Date End Date Taking? Authorizing Provider  alfuzosin (UROXATRAL) 10 MG 24 hr tablet Take 10 mg by mouth daily.   Yes Historical Provider, MD  ALPRAZolam (XANAX XR) 1 MG 24 hr tablet Take 3 mg by mouth daily.   Yes Historical  Provider, MD  atorvastatin (LIPITOR) 20 MG tablet Take 10 mg by mouth daily.    Yes Historical Provider, MD  darunavir-cobicistat (PREZCOBIX) 800-150 MG per tablet Take 1 tablet by mouth daily. Swallow whole. Do NOT crush, break or chew tablets. Take with food.   Yes Historical Provider, MD  dolutegravir (TIVICAY) 50 MG tablet Take 50 mg by mouth daily.   Yes Historical Provider, MD  emtricitabine-tenofovir AF (DESCOVY) 200-25 MG tablet Take 1 tablet by  mouth daily.   Yes Historical Provider, MD  enalapril-hydrochlorothiazide (VASERETIC) 10-25 MG per tablet Take 1 tablet by mouth daily. 12/09/12  Yes Historical Provider, MD  loratadine (CLARITIN) 10 MG tablet Take 10 mg by mouth daily.   Yes Historical Provider, MD  omega-3 acid ethyl esters (LOVAZA) 1 G capsule Take 1 g by mouth 2 (two) times daily.   Yes Historical Provider, MD  valACYclovir (VALTREX) 500 MG tablet Take 500 mg by mouth daily.  01/28/13  Yes Historical Provider, MD  albuterol (PROAIR HFA) 108 (90 Base) MCG/ACT inhaler Inhale 2 puffs into the lungs every 4 (four) hours as needed for wheezing or shortness of breath. Patient not taking: Reported on 02/11/2016 11/08/15   Gean Quint, MD  beclomethasone (BECONASE AQ) 42 MCG/SPRAY nasal spray 1-2 sprays each nostril once daily. Patient not taking: Reported on 01/08/2016 11/08/15   Gean Quint, MD  dexlansoprazole (DEXILANT) 60 MG capsule Take 1 capsule (60 mg total) by mouth daily. Patient not taking: Reported on 02/11/2016 01/16/16   Jiles Prows, MD  esomeprazole (NEXIUM) 40 MG capsule Take one tablet once daily as directed. 01/08/16   Jiles Prows, MD  fondaparinux (ARIXTRA) 10 MG/0.8ML SOLN injection Inject 0.8 mLs (10 mg total) into the skin daily. 05/27/16 06/26/16  Akiva Brassfield C Stephanne Greeley, PA-C  montelukast (SINGULAIR) 10 MG tablet Take 1 tablet (10 mg total) by mouth at bedtime. 01/28/16   Jiles Prows, MD    Family History Family History  Problem Relation Age of Onset  . Diabetes Mother   . Heart attack Paternal Grandfather   . Deep vein thrombosis Father   . Allergic rhinitis Father   . Asthma Neg Hx   . Eczema Neg Hx   . Urticaria Neg Hx     Social History Social History  Substance Use Topics  . Smoking status: Never Smoker  . Smokeless tobacco: Never Used  . Alcohol use 0.0 oz/week     Comment: Occasional.     Allergies   Efavirenz   Review of Systems Review of Systems  Constitutional: Negative for chills, diaphoresis  and fever.  Respiratory: Negative for cough and shortness of breath.   Cardiovascular: Negative for chest pain.  Gastrointestinal: Negative for nausea and vomiting.  Musculoskeletal: Positive for joint swelling (right foot and ankle).  Neurological: Negative for weakness and numbness.  All other systems reviewed and are negative.    Physical Exam Updated Vital Signs BP 142/76 (BP Location: Left Arm)   Pulse 72   Temp 97.3 F (36.3 C) (Oral)   Resp 17   Ht 5' 5.5" (1.664 m)   Wt 77.4 kg   SpO2 98%   BMI 27.95 kg/m   Physical Exam  Constitutional: He appears well-developed and well-nourished. No distress.  HENT:  Head: Normocephalic and atraumatic.  Eyes: Conjunctivae are normal.  Neck: Neck supple.  Cardiovascular: Normal rate, regular rhythm, normal heart sounds and intact distal pulses.   Pulmonary/Chest: Effort normal and breath sounds normal. No respiratory  distress.  Abdominal: Soft. There is no tenderness. There is no guarding.  Musculoskeletal: Normal range of motion. He exhibits edema. He exhibits no tenderness.  Edema noted to right ankle and foot. Nontender.   Lymphadenopathy:    He has no cervical adenopathy.  Neurological: He is alert.  No sensory deficits in bilateral lower extremities. Strength 5 out of 5 bilaterally.  Skin: Skin is warm and dry. He is not diaphoretic.  Psychiatric: He has a normal mood and affect. His behavior is normal.  Nursing note and vitals reviewed.    ED Treatments / Results  Labs (all labs ordered are listed, but only abnormal results are displayed) Labs Reviewed  COMPREHENSIVE METABOLIC PANEL - Abnormal; Notable for the following:       Result Value   Glucose, Bld 102 (*)    Creatinine, Ser 1.39 (*)    Total Protein 6.1 (*)    Alkaline Phosphatase 35 (*)    GFR calc non Af Amer 53 (*)    All other components within normal limits  CBC WITH DIFFERENTIAL/PLATELET - Abnormal; Notable for the following:    Platelets 119 (*)      All other components within normal limits  PROTIME-INR  APTT  HEPARIN ANTI-XA    EKG  EKG Interpretation None       Radiology Dg Ankle Complete Right  Result Date: 05/27/2016 CLINICAL DATA:  Right ankle pain. EXAM: RIGHT ANKLE - COMPLETE 3+ VIEW COMPARISON:  None. FINDINGS: No fracture or dislocation. The alignment and joint spaces are maintained. The ankle mortise is preserved. Small plantar calcaneal spur and Achilles tendon enthesophyte. No focal soft tissue abnormality. No evidence of joint effusion. IMPRESSION: No acute bony abnormality. Small plantar calcaneal spur and Achilles tendon enthesophyte. Electronically Signed   By: Jeb Levering M.D.   On: 05/27/2016 06:10    Procedures Procedures (including critical care time)  Medications Ordered in ED Medications  fondaparinux (ARIXTRA) injection 10 mg (10 mg Subcutaneous Given 05/27/16 1139)     Initial Impression / Assessment and Plan / ED Course  I have reviewed the triage vital signs and the nursing notes.  Pertinent labs & imaging results that were available during my care of the patient were reviewed by me and considered in my medical decision making (see chart for details).  Clinical Course    Michael [name] presents with right lower extremity swelling beginning this morning.  Findings and plan of care discussed with [facility]   Acute DVT noted on duplex ultrasound. Hematologist is Dr. Beryle Beams.  9:59 AM Spoke with Dr. 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 Can not take medications like Xarelto due to his antiretroviral therapy. Increase arixtra by 25% to 10 mg daily. Obtain a Lovenox level. Repeat doppler US in 2 weeks. Appointment with Dr. Beryle Beams on Friday 25th at 2pm. This information was Indicated with the patient. Return precautions discussed. Patient voices understanding of these  instructions, accepts the plan, and is comfortable with discharge.  Creatinine noted to be high and platelets noted to be low, both values consistent with previous. States he is involved in a clinical trial with some new HIV medications and states that the Descovy makes his creatinine artificially appear elevated.    Note: Dr. Beryle Beams presented an alternative therapy for this patient should the increase in Arixtra fail to prevent clots. Switch to coumadin and add Lovenox.  Vitals:   05/27/16 0538 05/27/16 0539 05/27/16 0840  BP:  142/76 112/72  Pulse:  72 61  Resp:  17 18  Temp:  97.3 F (36.3 C)   TempSrc:  Oral   SpO2:  98% 100%  Weight: 77.4 kg    Height: 5' 5.5" (1.664 m)     Vitals:   05/27/16 0539 05/27/16 0840 05/27/16 1130 05/27/16 1154  BP: 142/76 112/72 115/73 115/73  Pulse: 72 61 (!) 53 (!) 53  Resp: 17 18  18   Temp: 97.3 F (36.3 C)   97.5 F (36.4 C)  TempSrc: Oral   Oral  SpO2: 98% 100% 98% 100%  Weight:      Height:         Final Clinical Impressions(s) / ED Diagnoses   Final diagnoses:  DVT (deep venous thrombosis), right    New Prescriptions Discharge Medication List as of 05/27/2016 11:23 AM       Lorayne Bender, PA-C 05/27/16 1522    Merrily Pew, MD 05/28/16 1504

## 2016-05-27 NOTE — Discharge Instructions (Signed)
You have been seen today for leg swelling. There was evidence of a new DVT in the right ankle.   Follow these discharge instructions below: 1. Arixtra: Increase your daily dose of this medication to 10 mg daily. 2. Hematology: You have an appointment with Dr. Beryle Beams on August 25 at 2 pm. His office should call you to confirm. 3. Repeat ultrasound: You should have a repeat lower extremity ultrasound performed in two weeks to assure the resolution of the clot. This will have to be scheduled through your PCP or Dr. Beryle Beams.  Return to the ED as needed.

## 2016-05-27 NOTE — ED Triage Notes (Signed)
Pt. reports right ankle pain with swelling extending to foot onset yesterday , denies injury/ambulatory .No fever or chills/respirations unlabored .

## 2016-05-27 NOTE — Progress Notes (Signed)
VASCULAR LAB PRELIMINARY  PRELIMINARY  PRELIMINARY  PRELIMINARY  Right lower extremity venous duplex completed.    Preliminary report:  Right:  Acute DVT noted in the posterior tibial vein at the ankle .  No evidence of superficial thrombosis.  No Baker's cyst. Previous DVT noted 09/20/16 in the femoral and gastrocnemius veins appear to have resolved.    Michael [name], RVS 05/27/2016, 9:17 AM

## 2016-05-27 NOTE — ED Notes (Signed)
Awaiting post im injection

## 2016-05-30 ENCOUNTER — Ambulatory Visit (INDEPENDENT_AMBULATORY_CARE_PROVIDER_SITE_OTHER): Payer: Managed Care, Other (non HMO) | Admitting: Oncology

## 2016-05-30 ENCOUNTER — Encounter: Payer: Self-pay | Admitting: Oncology

## 2016-05-30 DIAGNOSIS — Z86718 Personal history of other venous thrombosis and embolism: Secondary | ICD-10-CM

## 2016-05-30 DIAGNOSIS — Z7901 Long term (current) use of anticoagulants: Secondary | ICD-10-CM | POA: Diagnosis not present

## 2016-05-30 DIAGNOSIS — Z86711 Personal history of pulmonary embolism: Secondary | ICD-10-CM

## 2016-05-30 DIAGNOSIS — M7989 Other specified soft tissue disorders: Secondary | ICD-10-CM

## 2016-05-30 DIAGNOSIS — I82441 Acute embolism and thrombosis of right tibial vein: Secondary | ICD-10-CM

## 2016-05-30 DIAGNOSIS — Z23 Encounter for immunization: Secondary | ICD-10-CM | POA: Diagnosis not present

## 2016-05-30 LAB — CBC WITH DIFFERENTIAL/PLATELET
BASOS ABS: 0 10*3/uL (ref 0.0–0.1)
BASOS PCT: 0 %
Eosinophils Absolute: 0.2 10*3/uL (ref 0.0–0.7)
Eosinophils Relative: 2 %
HEMATOCRIT: 45.3 % (ref 39.0–52.0)
HEMOGLOBIN: 16 g/dL (ref 13.0–17.0)
Lymphocytes Relative: 32 %
Lymphs Abs: 2.4 10*3/uL (ref 0.7–4.0)
MCH: 33.1 pg (ref 26.0–34.0)
MCHC: 35.3 g/dL (ref 30.0–36.0)
MCV: 93.8 fL (ref 78.0–100.0)
MONOS PCT: 5 %
Monocytes Absolute: 0.4 10*3/uL (ref 0.1–1.0)
NEUTROS ABS: 4.5 10*3/uL (ref 1.7–7.7)
NEUTROS PCT: 61 %
Platelets: 133 10*3/uL — ABNORMAL LOW (ref 150–400)
RBC: 4.83 MIL/uL (ref 4.22–5.81)
RDW: 13.1 % (ref 11.5–15.5)
WBC: 7.5 10*3/uL (ref 4.0–10.5)

## 2016-05-30 LAB — HEPARIN ANTI-XA: HEPARIN LMW: 1.83 [IU]/mL

## 2016-05-30 LAB — D-DIMER, QUANTITATIVE: D-Dimer, Quant: <0.27 ug/mL-FEU (ref 0.00–0.50)

## 2016-05-30 NOTE — Patient Instructions (Addendum)
Continue Arixtra 10 mg daily until further notice  follow up with Dr Darnell Level in 6 months - call for any interval problems

## 2016-06-02 ENCOUNTER — Telehealth: Payer: Self-pay | Admitting: *Deleted

## 2016-06-02 ENCOUNTER — Other Ambulatory Visit: Payer: Self-pay | Admitting: Oncology

## 2016-06-02 MED ORDER — FONDAPARINUX SODIUM 10 MG/0.8ML ~~LOC~~ SOLN: 7.5000 mg | SUBCUTANEOUS | 0 refills | Status: DC

## 2016-06-02 NOTE — Telephone Encounter (Signed)
Pt called / informed "d-dimer normal - no good evidence that he had another clot. OK to go back on the 7.5 mg daily Arixtra dose." Per Dr Beryle Beams. Stated he will need a new rx [facility] to [facility] on [address].

## 2016-06-02 NOTE — Telephone Encounter (Signed)
Pt called

## 2016-06-02 NOTE — Telephone Encounter (Signed)
-----   Message from Annia Belt, MD sent at 06/02/2016 10:17 AM EDT ----- Call pt: d-dimer normal - no good evidence that he had another clot. OK to go back on the 7.5 mg daily Arixtra dose.

## 2016-06-02 NOTE — Progress Notes (Signed)
Hematology and Oncology Follow Up Visit  Michael [name] [id number] [birth year] 60 y.o. 06/02/2016 4:14 PM   Principle Diagnosis: Encounter Diagnoses  Name Primary?  . Acute deep vein thrombosis (DVT) of tibial vein of right lower extremity ([place])   . Encounter for immunization      Interim History: Work in visit for this 60 year old HIV-positive man well controlled on antiretroviral medications who I follow for an initial pulmonary embolus in April 2015 2 weeks after right shoulder arthroplasty then a saddle pulmonary embolus pulmonary embolus in October 2016, both likely provoked, then a subsequent right, proximal vein thrombosis in the femoral and gastrocnemius veins in December 2016. He is on chronic anticoagulation. He is currently on daily Arixtra injections 7.5 mg. he presented to the emergency department the other day on August 22 with a 24-hour history of increasing right foot and ankle pain and swelling. He has a history of plantar fasciitis on the right foot. He does not believe that he is having a flare up at this time. Venous Doppler study read as consistent with an acute DVT involving a small area of the posterior tibial vein in the mid to distal region of the right lower extremity. Previous DVT in the femoral and gastrocs veins had resolved. He remarks to me that the technician had difficulty visualizing the veins. The emergency physician called me. I recommended increasing his Arixtra dose by 25% up to 10 mg daily. I advised a Lovenox, Xa level which returned at 0.45 which is subtherapeutic but based on the patient's history today, this was a trough level since he takes his usual dose around 10 in the morning and the lab study was sent off at 1035 before he received a dose of Arixtra in the emergency department. Over the last 48 hours, the pain and swelling in his right ankle have completely resolved.  Medications: reviewed  Allergies:  Allergies  Allergen Reactions  . Efavirenz  Rash    Review of Systems: He denies any dyspnea, chest pain, or palpitations. Remaining ROS negative:   Physical Exam: Blood pressure 139/64, pulse 78, temperature 98 F (36.7 C), temperature source Oral, weight 172 lb 8 oz (78.2 kg), SpO2 98 %. Wt Readings from Last 3 Encounters:  05/30/16 172 lb 8 oz (78.2 kg)  05/27/16 170 lb 9 oz (77.4 kg)  11/20/15 177 lb 12.8 oz (80.6 kg)     General appearance: Well-nourished Caucasian man HENNT: Pharynx no erythema, exudate, mass, or ulcer. No thyromegaly or thyroid nodules Lymph nodes: No cervical, supraclavicular, or axillary lymphadenopathy Breasts: Lungs: Clear to auscultation, resonant to percussion throughout Heart: Regular rhythm, no murmur, no gallop, no rub, no click, no edema Abdomen: Soft, nontender, normal bowel sounds, no mass, no organomegaly Extremities: No edema, no calf tenderness I measured his calves and ankles and they were identical on both sides. Musculoskeletal: no joint deformities GU:  Vascular: Carotid pulses 2+, no bruits, distal pulses: Dorsalis pedis 2+ symmetric Neurologic: Alert, oriented, PERRLA,  cranial nerves grossly normal, motor strength 5 over 5, reflexes 1+ symmetric, upper body coordination normal, gait normal, Skin: No rash or ecchymosis  Lab Results: CBC W/Diff    Component Value Date/Time   WBC 7.5 05/30/2016 1350   RBC 4.83 05/30/2016 1350   HGB 16.0 05/30/2016 1350   HCT 45.3 05/30/2016 1350   HCT 43.9 11/20/2015 0927   PLT 133 (L) 05/30/2016 1350   PLT 174 11/20/2015 0927   MCV 93.8 05/30/2016 1350   MCV 97 11/20/2015  [id number]   MCH 33.1 05/30/2016 1350   MCHC 35.3 05/30/2016 1350   RDW 13.1 05/30/2016 1350   RDW 13.8 11/20/2015 0927   LYMPHSABS 2.4 05/30/2016 1350   LYMPHSABS 2.8 11/20/2015 0927   MONOABS 0.4 05/30/2016 1350   EOSABS 0.2 05/30/2016 1350   EOSABS 0.1 11/20/2015 0927   BASOSABS 0.0 05/30/2016 1350   BASOSABS 0.0 11/20/2015 0927     Chemistry      Component  Value Date/Time   NA 137 05/27/2016 0744   NA 138 11/23/2015 1525   K 3.9 05/27/2016 0744   CL 103 05/27/2016 0744   CO2 27 05/27/2016 0744   BUN 14 05/27/2016 0744   BUN 16 11/23/2015 1525   CREATININE 1.39 (H) 05/27/2016 0744      Component Value Date/Time   CALCIUM 9.4 05/27/2016 0744   ALKPHOS 35 (L) 05/27/2016 0744   AST 25 05/27/2016 0744   ALT 23 05/27/2016 0744   BILITOT 1.0 05/27/2016 0744   BILITOT 0.4 11/23/2015 1525       Radiological Studies: Dg Ankle Complete Right  Result Date: 05/27/2016 CLINICAL DATA:  Right ankle pain. EXAM: RIGHT ANKLE - COMPLETE 3+ VIEW COMPARISON:  None. FINDINGS: No fracture or dislocation. The alignment and joint spaces are maintained. The ankle mortise is preserved. Small plantar calcaneal spur and Achilles tendon enthesophyte. No focal soft tissue abnormality. No evidence of joint effusion. IMPRESSION: No acute bony abnormality. Small plantar calcaneal spur and Achilles tendon enthesophyte. Electronically Signed   By: Jeb Levering M.D.   On: 05/27/2016 06:10    Impression:  I'm not convinced given the transient nature of the foot swelling, that he has actually had a new clot. Even if he did, this is in a distal tibial vein and therefore does not require aggressive therapy. I checked a d-dimer today and it is undetectable. I obtained a peak Xa level on the 10 mg dose which was supratherapeutic at 1.8. I told him to continue the escalated Arixtra dose over the weekend until these results were available. I had my nurse call him today, August 28, to advise him to go back to the 7.5 mg daily dose of Arixtra. If symptoms recur, I will get a follow-up venous Doppler study.   CC: Patient Care Team: Fanny Bien, MD as PCP - General (Family Medicine) Annia Belt, MD as Consulting Physician (Oncology) Foye Spurling, MD as Referring Physician   Annia Belt, MD 8/28/20174:14 PM

## 2016-06-02 NOTE — Telephone Encounter (Signed)
I reordered - he may need to call pharmacy so he gets the 7.5 mg pre-filled syringes

## 2016-07-23 ENCOUNTER — Telehealth: Payer: Self-pay | Admitting: *Deleted

## 2016-07-23 NOTE — Telephone Encounter (Signed)
States he's having colonoscopy on Monday Nov 6. Wants to be sure no Arixtra injection the day before (Sunday) and the day of (Monday) and re-start Arixtra on Tuesday? As discussed at last visit.

## 2016-07-23 NOTE — Telephone Encounter (Signed)
That is correct 

## 2016-07-23 NOTE — Telephone Encounter (Signed)
Pt called / informed he's correct as stated.

## 2016-10-16 ENCOUNTER — Other Ambulatory Visit: Payer: Self-pay | Admitting: *Deleted

## 2016-10-16 MED ORDER — FONDAPARINUX SODIUM 10 MG/0.8ML ~~LOC~~ SOLN: 7.5000 mg | SUBCUTANEOUS | 11 refills | Status: DC

## 2016-10-16 NOTE — Telephone Encounter (Signed)
Requesting year supply. Thanks

## 2016-11-03 ENCOUNTER — Emergency Department (HOSPITAL_COMMUNITY): Payer: Managed Care, Other (non HMO)

## 2016-11-03 ENCOUNTER — Emergency Department (HOSPITAL_COMMUNITY)
Admission: EM | Admit: 2016-11-03 | Discharge: 2016-11-03 | Disposition: A | Discharge status: Home / Self Care | Payer: Managed Care, Other (non HMO) | Attending: Emergency Medicine | Admitting: Emergency Medicine

## 2016-11-03 ENCOUNTER — Encounter (HOSPITAL_COMMUNITY): Payer: Self-pay | Admitting: Emergency Medicine

## 2016-11-03 DIAGNOSIS — Z79899 Other long term (current) drug therapy: Secondary | ICD-10-CM | POA: Diagnosis not present

## 2016-11-03 DIAGNOSIS — Y929 Unspecified place or not applicable: Secondary | ICD-10-CM | POA: Insufficient documentation

## 2016-11-03 DIAGNOSIS — Y939 Activity, unspecified: Secondary | ICD-10-CM | POA: Diagnosis not present

## 2016-11-03 DIAGNOSIS — W228XXA Striking against or struck by other objects, initial encounter: Secondary | ICD-10-CM | POA: Insufficient documentation

## 2016-11-03 DIAGNOSIS — Y999 Unspecified external cause status: Secondary | ICD-10-CM | POA: Diagnosis not present

## 2016-11-03 DIAGNOSIS — S0990XA Unspecified injury of head, initial encounter: Secondary | ICD-10-CM | POA: Insufficient documentation

## 2016-11-03 DIAGNOSIS — Z5321 Procedure and treatment not carried out due to patient leaving prior to being seen by health care provider: Secondary | ICD-10-CM | POA: Diagnosis not present

## 2016-11-03 DIAGNOSIS — I10 Essential (primary) hypertension: Secondary | ICD-10-CM | POA: Diagnosis not present

## 2016-11-03 LAB — BASIC METABOLIC PANEL
ANION GAP: 13 (ref 5–15)
BUN: 16 mg/dL (ref 6–20)
CALCIUM: 9.8 mg/dL (ref 8.9–10.3)
CO2: 25 mmol/L (ref 22–32)
Chloride: 101 mmol/L (ref 101–111)
Creatinine, Ser: 1.3 mg/dL — ABNORMAL HIGH (ref 0.61–1.24)
GFR calc Af Amer: >60 mL/min (ref >60)
GFR calc non Af Amer: 57 mL/min — ABNORMAL LOW (ref >60)
GLUCOSE: 89 mg/dL (ref 65–99)
Potassium: 3.3 mmol/L — ABNORMAL LOW (ref 3.5–5.1)
Sodium: 139 mmol/L (ref 135–145)

## 2016-11-03 LAB — PROTIME-INR
INR: 1.09
Prothrombin Time: 14.1 seconds (ref 11.4–15.2)

## 2016-11-03 LAB — CBC WITH DIFFERENTIAL/PLATELET
BASOS ABS: 0 10*3/uL (ref 0.0–0.1)
Basophils Relative: 0 %
EOS PCT: 1 %
Eosinophils Absolute: 0.1 10*3/uL (ref 0.0–0.7)
HCT: 44.2 % (ref 39.0–52.0)
Hemoglobin: 16.2 g/dL (ref 13.0–17.0)
LYMPHS ABS: 2.7 10*3/uL (ref 0.7–4.0)
Lymphocytes Relative: 32 %
MCH: 34.5 pg — AB (ref 26.0–34.0)
MCHC: 36.7 g/dL — AB (ref 30.0–36.0)
MCV: 94 fL (ref 78.0–100.0)
MONO ABS: 0.6 10*3/uL (ref 0.1–1.0)
MONOS PCT: 7 %
NEUTROS ABS: 5.1 10*3/uL (ref 1.7–7.7)
Neutrophils Relative %: 60 %
PLATELETS: 131 10*3/uL — AB (ref 150–400)
RBC: 4.7 MIL/uL (ref 4.22–5.81)
RDW: 12.5 % (ref 11.5–15.5)
WBC: 8.5 10*3/uL (ref 4.0–10.5)

## 2016-11-03 NOTE — ED Triage Notes (Signed)
Patient accidentally hit his head against a cabinet door this evening , denies LOC , reports right side headache , pt. stated he is taking anticoagulant " Arixtra" , Dr. Alvino Chapel ( EDP ) ordered CT scan of head .

## 2016-11-26 ENCOUNTER — Other Ambulatory Visit: Payer: Self-pay | Admitting: *Deleted

## 2016-11-26 ENCOUNTER — Telehealth: Payer: Self-pay | Admitting: Oncology

## 2016-11-26 ENCOUNTER — Other Ambulatory Visit: Payer: Self-pay | Admitting: Oncology

## 2016-11-26 MED ORDER — FONDAPARINUX SODIUM 7.5 MG/0.6ML ~~LOC~~ SOLN: 7.5000 mg | SUBCUTANEOUS | 3 refills | Status: DC

## 2016-11-26 NOTE — Telephone Encounter (Signed)
Pt needs 7.5 mg/0.6 ml rx sent to pharmacy.

## 2016-11-26 NOTE — Telephone Encounter (Signed)
Patient stated pharmacy gave him 10 mg dose of arixtra. I called [facility] & clarified dose but they request another refill to be sent. Thanks!

## 2017-01-08 NOTE — Telephone Encounter (Signed)
error 

## 2017-08-24 ENCOUNTER — Other Ambulatory Visit: Payer: Self-pay | Admitting: *Deleted

## 2017-08-24 MED ORDER — FONDAPARINUX SODIUM 7.5 MG/0.6ML ~~LOC~~ SOLN: 7.5000 mg | SUBCUTANEOUS | 3 refills | Status: DC

## 2017-08-24 NOTE — Telephone Encounter (Signed)
Need rx refilled at [facility] - d/t to his insurance.

## 2017-08-24 NOTE — Telephone Encounter (Signed)
Called pt - no answer; left message rx has been refilled.

## 2017-08-24 NOTE — Telephone Encounter (Signed)
Last appt 05/2016.

## 2017-11-13 ENCOUNTER — Other Ambulatory Visit: Payer: Self-pay | Admitting: Orthopedic Surgery

## 2017-11-13 DIAGNOSIS — M25562 Pain in left knee: Secondary | ICD-10-CM

## 2017-11-15 ENCOUNTER — Ambulatory Visit
Admission: RE | Admit: 2017-11-15 | Discharge: 2017-11-15 | Disposition: A | Discharge status: Home / Self Care | Payer: 59 | Source: Ambulatory Visit | Attending: Orthopedic Surgery | Admitting: Orthopedic Surgery

## 2017-11-15 DIAGNOSIS — M25562 Pain in left knee: Secondary | ICD-10-CM

## 2017-11-24 ENCOUNTER — Ambulatory Visit: Payer: 59 | Admitting: Oncology

## 2017-11-24 ENCOUNTER — Other Ambulatory Visit: Payer: Self-pay

## 2017-11-24 ENCOUNTER — Encounter: Payer: Self-pay | Admitting: Oncology

## 2017-11-24 VITALS — BP 131/70 | HR 70 | Temp 97.9°F | Ht 65.0 in | Wt 193.5 lb

## 2017-11-24 DIAGNOSIS — I82502 Chronic embolism and thrombosis of unspecified deep veins of left lower extremity: Secondary | ICD-10-CM | POA: Diagnosis not present

## 2017-11-24 DIAGNOSIS — I2782 Chronic pulmonary embolism: Secondary | ICD-10-CM | POA: Diagnosis not present

## 2017-11-24 DIAGNOSIS — Z7901 Long term (current) use of anticoagulants: Secondary | ICD-10-CM | POA: Diagnosis not present

## 2017-11-24 MED ORDER — FONDAPARINUX SODIUM 7.5 MG/0.6ML ~~LOC~~ SOLN: 7.5000 mg | SUBCUTANEOUS | 3 refills | Status: DC

## 2017-11-24 NOTE — Patient Instructions (Signed)
To lab today Return visit 1 year

## 2017-11-24 NOTE — Progress Notes (Signed)
Hematology and Oncology Follow Up Visit  Michael [name] [id number] [birth year] 62 y.o. 11/24/2017 9:56 AM   Principle Diagnosis: Encounter Diagnoses  Name Primary?  . Chronic anticoagulation Yes  . Chronic deep vein thrombosis (DVT) of left lower extremity, unspecified vein (HCC)   . Other chronic pulmonary embolism without acute cor pulmonale [facility])   Updated clinical summary: 62 year-old HIV-positive man well controlled on antiretroviral medications who I follow for an initial pulmonary embolus in April 2015 which occurred 2 weeks after right shoulder arthroplasty.  Subsequently he had  a saddle pulmonary embolus pulmonary embolus in October 2016, both likely provoked, then a subsequent right, proximal vein thrombosis in the femoral and gastrocnemius veins in December 2016. He is on chronic anticoagulation with daily Arixtra injections 7.5 mg.  Hypercoagulation profile was unremarkable.  Initial false positive lupus anticoagulant which was not reproducible when the test was done under the appropriate conditions.  On May 27, 2016 he presented with a 24-hour history of increasing right foot and ankle pain and swelling. He has a history of plantar fasciitis on the right foot. Venous Doppler study read as consistent with an acute DVT involving a small area of the posterior tibial vein in the mid to distal region of the right lower extremity. Previous DVT in the femoral and gastrocs veins had resolved. He remarks to me that the technician had difficulty visualizing the veins.  I recommended increasing his Arixtra dose by 25% up to 10 mg daily. A Lovenox, Xa level which returned at 0.45 which is subtherapeutic but based on the patient's history, this was a trough level since he takes his usual dose around 10 in the morning and the lab study was sent off at 1035 before he received a dose of Arixtra in the emergency department.  It was really not clear that he had a new clot.  A d-dimer was undetectable.  A  peakXa level on 10 mg of Arixtra was supratherapeutic at 1.8.  I elected to resume the 7.5 mg daily dose.  He has subsequently been stable on this dose with no further thrombotic events.    Interim History: Overall doing well.  He plans to retire this year.  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  He has had no interim medical problems except for some intermittent pain in his left knee.  He was evaluated by Dr. Lorre Nick.  He is considering arthroscopic surgery. He denies any dyspnea, chest pain, palpitations, leg pain or swelling. No bleeding problems on the Arixtra.  He prefers the injections over oral anticoagulants. His HIV disease remains well controlled.  I do not see a recent RNA level on file.  Medications: reviewed  Allergies:  Allergies  Allergen Reactions  . Efavirenz Rash    Review of Systems: See interim history Remaining ROS negative:   Physical Exam: Blood pressure 131/70, pulse 70, temperature 97.9 F (36.6 C), temperature source Oral, height 5\' 5"  (1.651 m), weight 193 lb 8 oz (87.8 kg), SpO2 98 %. Wt Readings from Last 3 Encounters:  11/24/17 193 lb 8 oz (87.8 kg)  11/03/16 165 lb (74.8 kg)  05/30/16 172 lb 8 oz (78.2 kg)     General appearance: Well-nourished Caucasian man HENNT: Pharynx no erythema, exudate, mass, or ulcer. No thyromegaly or thyroid nodules Lymph nodes: No cervical, supraclavicular, or axillary lymphadenopathy Breasts:  Lungs: Clear to auscultation, resonant to percussion throughout Heart: Regular rhythm, no murmur, no gallop, no rub, no  click, no edema Abdomen: Soft, nontender, normal bowel sounds, no mass, no organomegaly Extremities: No edema, no calf tenderness Musculoskeletal: no joint deformities GU:  Vascular: Carotid pulses 2+, no bruits, metric Neurologic: Alert, oriented, PERRLA, optic discs sharp and vessels normal, no hemorrhage or exudate, cranial nerves grossly normal, motor strength 5  over 5, reflexes 2+ symmetric, upper body coordination normal, gait normal, Skin: No rash or ecchymosis  Lab Results: CBC W/Diff    Component Value Date/Time   WBC 8.5 11/03/2016 2116   RBC 4.70 11/03/2016 2116   HGB 16.2 11/03/2016 2116   HGB 15.2 11/20/2015 0927   HCT 44.2 11/03/2016 2116   HCT 43.9 11/20/2015 0927   PLT 131 (L) 11/03/2016 2116   PLT 174 11/20/2015 0927   MCV 94.0 11/03/2016 2116   MCV 97 11/20/2015 0927   MCH 34.5 (H) 11/03/2016 2116   MCHC 36.7 (H) 11/03/2016 2116   RDW 12.5 11/03/2016 2116   RDW 13.8 11/20/2015 0927   LYMPHSABS 2.7 11/03/2016 2116   LYMPHSABS 2.8 11/20/2015 0927   MONOABS 0.6 11/03/2016 2116   EOSABS 0.1 11/03/2016 2116   EOSABS 0.1 11/20/2015 0927   BASOSABS 0.0 11/03/2016 2116   BASOSABS 0.0 11/20/2015 0927     Chemistry      Component Value Date/Time   NA 139 11/03/2016 2116   NA 138 11/23/2015 1525   K 3.3 (L) 11/03/2016 2116   CL 101 11/03/2016 2116   CO2 25 11/03/2016 2116   BUN 16 11/03/2016 2116   BUN 16 11/23/2015 1525   CREATININE 1.30 (H) 11/03/2016 2116      Component Value Date/Time   CALCIUM 9.8 11/03/2016 2116   ALKPHOS 35 (L) 05/27/2016 0744   AST 25 05/27/2016 0744   ALT 23 05/27/2016 0744   BILITOT 1.0 05/27/2016 0744   BILITOT 0.4 11/23/2015 1525       Radiological Studies: Mr Knee Left Wo Contrast  Result Date: 11/15/2017 CLINICAL DATA:  Four month history of knee pain. No specific injury. EXAM: MRI OF THE LEFT KNEE WITHOUT CONTRAST TECHNIQUE: Multiplanar, multisequence MR imaging of the knee was performed. No intravenous contrast was administered. COMPARISON:  None. FINDINGS: MENISCI Medial meniscus:  Intact Lateral meniscus:  Intact.  Discoid morphology. LIGAMENTS Cruciates:  Intact Collaterals:  Intact CARTILAGE Patellofemoral: Normal except for a small focus of chondromalacia involving the medial facet. Medial:  Mild degenerative chondrosis. Lateral:  Normal Joint: No joint effusion. Mild  inflammation and slight thickening of the quadriceps fat pad. Popliteal Fossa:  No popliteal mass.  Small Baker's cyst. Extensor Mechanism: The patella retinacular structures are intact and the quadriceps and patellar tendons are intact. Bones: No acute bony findings. No bone contusion, marrow edema or osteochondral lesion. Other: Normal knee musculature. IMPRESSION: 1. Intact ligamentous structures and no acute bony findings. 2. No meniscal tears.  Discoid morphology of the lateral meniscus. 3. Mild degenerative chondrosis involving the medial compartment. 4. Mild thickening and inflammation of the quadriceps fat pad. 5. No joint effusion.  Very small Baker's cyst. Electronically Signed   By: Marijo Sanes M.D.   On: 11/15/2017 12:20    Impression:  1.  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 He remains stable on long-term anticoagulation with parenteral Arixtra 7.5 mg daily. Plan to continue indefinitely.  2.  HIV disease controlled on meds  3.  Left knee pain.  Intermittent.  No meniscal tears on recent MRI.  He is being considered for arthroscopic surgery but he is  inclined to defer this at this time unless symptoms become more constant or progressive and I agree with this.  4.  History of hepatitis B infection  5.  Essential hypertension  6.  History of chronic right plantar fasciitis  7.  Hyperlipidemia on medication with Lipitor   CC: Patient Care Team: Fanny Bien, MD as PCP - General (Family Medicine) Annia Belt, MD as Consulting Physician (Oncology) Rudi Coco Waldo Laine, MD as Referring Physician   Murriel Hopper, MD, [place]  Hematology-Oncology/Internal Medicine     2/19/20199:56 AM

## 2017-11-25 LAB — CBC WITH DIFFERENTIAL/PLATELET
Basophils Absolute: 0 10*3/uL (ref 0.0–0.2)
Basos: 0 %
EOS (ABSOLUTE): 0.2 10*3/uL (ref 0.0–0.4)
EOS: 2 %
HEMATOCRIT: 46.2 % (ref 37.5–51.0)
Hemoglobin: 16.1 g/dL (ref 13.0–17.7)
Immature Grans (Abs): 0 10*3/uL (ref 0.0–0.1)
Immature Granulocytes: 0 %
LYMPHS ABS: 2.3 10*3/uL (ref 0.7–3.1)
Lymphs: 33 %
MCH: 34.2 pg — AB (ref 26.6–33.0)
MCHC: 34.8 g/dL (ref 31.5–35.7)
MCV: 98 fL — ABNORMAL HIGH (ref 79–97)
MONOS ABS: 0.4 10*3/uL (ref 0.1–0.9)
Monocytes: 6 %
NEUTROS ABS: 4 10*3/uL (ref 1.4–7.0)
Neutrophils: 59 %
Platelets: 134 10*3/uL — ABNORMAL LOW (ref 150–379)
RBC: 4.71 x10E6/uL (ref 4.14–5.80)
RDW: 13.7 % (ref 12.3–15.4)
WBC: 6.8 10*3/uL (ref 3.4–10.8)

## 2017-11-26 ENCOUNTER — Telehealth: Payer: Self-pay | Admitting: *Deleted

## 2017-11-26 NOTE — Telephone Encounter (Signed)
Pt called / informed "CBC normal, mild decrease in platelets no change from previous & no concern " per Dr Beryle Beams. No questions.

## 2017-11-26 NOTE — Telephone Encounter (Signed)
-----   Message from Annia Belt, MD sent at 11/25/2017 10:15 AM EST ----- Call pt: CBC normal, mild decrease in platelets no change from previous & no concern

## 2018-09-20 ENCOUNTER — Other Ambulatory Visit: Payer: Self-pay | Admitting: Oncology

## 2018-09-20 DIAGNOSIS — I2699 Other pulmonary embolism without acute cor pulmonale: Secondary | ICD-10-CM

## 2018-09-20 DIAGNOSIS — B20 Human immunodeficiency virus [HIV] disease: Secondary | ICD-10-CM

## 2018-09-20 DIAGNOSIS — I82502 Chronic embolism and thrombosis of unspecified deep veins of left lower extremity: Secondary | ICD-10-CM

## 2018-10-24 ENCOUNTER — Other Ambulatory Visit: Payer: Self-pay | Admitting: Oncology

## 2018-10-28 ENCOUNTER — Other Ambulatory Visit: Payer: Self-pay | Admitting: Oncology

## 2018-11-12 ENCOUNTER — Other Ambulatory Visit: Payer: Self-pay | Admitting: Family Medicine

## 2018-11-12 ENCOUNTER — Ambulatory Visit
Admission: RE | Admit: 2018-11-12 | Discharge: 2018-11-12 | Disposition: A | Discharge status: Home / Self Care | Payer: 59 | Source: Ambulatory Visit | Attending: Family Medicine | Admitting: Family Medicine

## 2018-11-12 DIAGNOSIS — S0990XA Unspecified injury of head, initial encounter: Secondary | ICD-10-CM

## 2018-11-15 ENCOUNTER — Other Ambulatory Visit: Payer: Self-pay | Admitting: Family Medicine

## 2018-11-15 DIAGNOSIS — S0990XA Unspecified injury of head, initial encounter: Secondary | ICD-10-CM

## 2018-12-06 ENCOUNTER — Encounter: Payer: Self-pay | Admitting: *Deleted

## 2019-03-08 ENCOUNTER — Encounter: Payer: Self-pay | Admitting: *Deleted

## 2019-03-15 ENCOUNTER — Telehealth: Payer: Self-pay | Admitting: Oncology

## 2019-03-15 ENCOUNTER — Encounter: Payer: Self-pay | Admitting: Oncology

## 2019-03-15 NOTE — Telephone Encounter (Signed)
A new hem appt has been scheduled for th ept to see Dr. Benay Spice on 7/7 at 830am. Lft the appt date and time on the pt's vm. Letter mailed.

## 2019-04-12 ENCOUNTER — Telehealth: Payer: Self-pay | Admitting: Oncology

## 2019-04-12 ENCOUNTER — Inpatient Hospital Stay: Payer: 59 | Attending: Oncology | Admitting: Oncology

## 2019-04-12 ENCOUNTER — Other Ambulatory Visit: Payer: Self-pay

## 2019-04-12 VITALS — BP 121/64 | HR 72 | Temp 98.7°F | Resp 17 | Ht 65.0 in | Wt 182.4 lb

## 2019-04-12 DIAGNOSIS — M722 Plantar fascial fibromatosis: Secondary | ICD-10-CM | POA: Diagnosis not present

## 2019-04-12 DIAGNOSIS — D696 Thrombocytopenia, unspecified: Secondary | ICD-10-CM | POA: Insufficient documentation

## 2019-04-12 DIAGNOSIS — Z86718 Personal history of other venous thrombosis and embolism: Secondary | ICD-10-CM | POA: Insufficient documentation

## 2019-04-12 DIAGNOSIS — I10 Essential (primary) hypertension: Secondary | ICD-10-CM | POA: Diagnosis not present

## 2019-04-12 DIAGNOSIS — Z7901 Long term (current) use of anticoagulants: Secondary | ICD-10-CM | POA: Insufficient documentation

## 2019-04-12 DIAGNOSIS — E785 Hyperlipidemia, unspecified: Secondary | ICD-10-CM | POA: Diagnosis not present

## 2019-04-12 DIAGNOSIS — Z79899 Other long term (current) drug therapy: Secondary | ICD-10-CM | POA: Diagnosis not present

## 2019-04-12 DIAGNOSIS — Z86711 Personal history of pulmonary embolism: Secondary | ICD-10-CM | POA: Diagnosis not present

## 2019-04-12 DIAGNOSIS — B20 Human immunodeficiency virus [HIV] disease: Secondary | ICD-10-CM | POA: Diagnosis not present

## 2019-04-12 DIAGNOSIS — I82409 Acute embolism and thrombosis of unspecified deep veins of unspecified lower extremity: Secondary | ICD-10-CM

## 2019-04-12 NOTE — Progress Notes (Signed)
[place] New Patient Consult   Requesting MD: Fanny Bien, Md [address],  [place] [address]   Michael [name] 63 y.o.  March 16, 1956    Reason for Consult: Recurrent venous thromboembolism   HPI: Mr. Poffenberger has a longstanding history of HIV infection, well controlled with anti-retroviral therapy.  He has been followed by Dr. Beryle Beams since 2015 when he developed a pulmonary embolism following a right shoulder surgery.  He developed a saddle embolus in October 2016 following casting of the right lower extremity for plantar fasciitis.  He had a right lower extremity DVT.  A hypercoagulation evaluation was unremarkable.  He was treated with Coumadin anticoagulation.  Anticoagulation was changed to fondaparinux after consultation with the hematology service at [facility]. In August 2017 he was diagnosed with an acute right posterior tibial DVT.  The fondaparinux dose was increased to 10 mg transiently.  Dr. Beryle Beams was unclear whether.  The Doppler finding represented a new DVT.  He was changed back to the 7.5 mg dose beginning 06/02/2016.  He has remained on this dose without evidence of recurrent thrombosis.   He denies bleeding.  He is followed in the infectious disease clinic at [facility] every 6 months.  No symptom of recurrent thrombosis.  Past Medical History:  Diagnosis Date  . Anxiety   . Childhood asthma   . DVT (deep venous thrombosis) ([place]) 07/2015; 10/01/2015   RLE; RLE  . GERD (gastroesophageal reflux disease)   . Hepatitis B   . HIV (human immunodeficiency virus infection) ([place])   . HTN (hypertension)   . Hyperlipidemia   . Pneumonia ~ 2000  . Pulmonary embolism ([place]) 12/2013  . Saddle pulmonary embolus ([place]) 07/2015    Past Surgical History:  Procedure Laterality Date  . ANTERIOR CERVICAL DECOMP/DISCECTOMY FUSION  2010   C40-C5  . APPENDECTOMY    . BACK SURGERY    . COLONOSCOPY    . SHOULDER ARTHROSCOPY W/ ROTATOR CUFF REPAIR  Bilateral 2000's   "twice on right; once on the left"  . SHOULDER ARTHROSCOPY WITH SUBACROMIAL DECOMPRESSION Right 12/15/2013   Procedure: RIGHT SHOULDER ARTHROSCOPY WITH EXTENSIVE DEBRIDEMENT OF ROTATOR CUFF,SUBACROMIAL DECOMPRESSION, DEBRIDMENT OF BURSA;  Surgeon: Ninetta Lights, MD;  Location: [facility];  Service: Orthopedics;  Laterality: Right;  . TONSILLECTOMY      Medications: Reviewed  Allergies:  Allergies  Allergen Reactions  . Efavirenz Rash    Family history: His father has a history of bladder cancer and lower extremity deep vein thrombosis-maintained on Coumadin  Social History:   He lives in [place].  He is a retired Astronomer.  He does not use cigarettes.  Rare alcohol use.  No transfusion history.  ROS:   Positives include: Dizziness and saw "bright lights "after exercise earlier this year- diagnosed with "migraine "headaches by his primary physician  A complete ROS was otherwise negative.  Physical Exam:  Blood pressure 121/64, pulse 72, temperature 98.7 F (37.1 C), temperature source Oral, resp. rate 17, height 5\' 5"  (1.651 m), weight 182 lb 6.4 oz (82.7 kg), SpO2 100 %.  Limited physical examination secondary to distancing with the COVID pandemic  Abdomen: No hepatosplenomegaly  Vascular: No leg edema Lymph nodes: No cervical or supraclavicular nodes   LAB:  [facility] 11/15/2018: Hemoglobin 16.3, platelets 131,000, white count 6.8, ANC 4.1 Creatinine 1.46   Assessment/Plan:   1. Hypercoagulation syndrome- recurrent venous thrombosis  Pulmonary embolism in April 2015 and  saddle pulmonary embolism and right lower extremity DVT in October 2016  Both episodes of pulmonary embolism appeared "provoked". 2. HIV infection 3. Hypertension 4. History of hepatitis B infection 5. Chronic right plantar fasciitis 6. Hyperlipidemia 7. Chronic mild thrombocytopenia  Disposition:   Mr. Gallicchio has a history of  recurrent venous thrombosis.  He is maintained on indefinite anticoagulation therapy with fondaparinux.  The episodes of pulmonary embolism appear to have been provoked from surgery and leg immobilization.  It is possible the venous thromboembolic disease is related to HIV infection.  I agree with indefinite anticoagulation.  He will continue fondaparinux.  He will be changing his insurance to Medicare and the fondaparinux may be more expensive.  I will asked the [facility] pharmacy to contact him to discuss options for obtaining the medication.  He has chronic mild thrombocytopenia potentially related to polypharmacy including the fondaparinux. He will call for bleeding.   He continues follow-up with Dr. Rudi Coco at [facility].  He has established with Dr. Darron Doom as his primary physician in [place].  He would like to continue periodic follow-up in the hematology clinic here.  He will be scheduled for a one-year office visit.  I am available to see him sooner as needed.  Betsy Coder, MD  04/12/2019, 2:20 PM

## 2019-04-12 NOTE — Telephone Encounter (Signed)
Scheduled appt per 7/7 los.  Patient declined calendar and avs.

## 2019-10-25 ENCOUNTER — Ambulatory Visit: Payer: Medicare Other | Attending: Internal Medicine

## 2019-10-25 DIAGNOSIS — Z23 Encounter for immunization: Secondary | ICD-10-CM | POA: Insufficient documentation

## 2019-11-14 ENCOUNTER — Ambulatory Visit: Payer: Medicare Other | Attending: Internal Medicine

## 2019-11-14 DIAGNOSIS — Z23 Encounter for immunization: Secondary | ICD-10-CM

## 2019-11-14 NOTE — Progress Notes (Signed)
   Covid-19 Vaccination Clinic  Name:  Michael [name]    MRN: [id number] DOB: [birth year]  11/14/2019  [name] was observed post Covid-19 immunization for 15 minutes without incidence. He was provided with Vaccine Information Sheet and instruction to access the V-Safe system.   [name] was instructed to call 911 with any severe reactions post vaccine: Marland Kitchen Difficulty breathing  . Swelling of your face and throat  . A fast heartbeat  . A bad rash all over your body  . Dizziness and weakness    Immunizations Administered    Name Date Dose VIS Date Route   Pfizer COVID-19 Vaccine 11/14/2019  8:32 AM 0.3 mL 09/16/2019 Intramuscular   Manufacturer: [place]   Lot: [id number]   [place]: [id number]

## 2019-11-22 ENCOUNTER — Ambulatory Visit: Payer: 59

## 2020-04-10 ENCOUNTER — Inpatient Hospital Stay: Payer: 59 | Admitting: Oncology

## 2020-05-29 ENCOUNTER — Ambulatory Visit
Admission: RE | Admit: 2020-05-29 | Discharge: 2020-05-29 | Disposition: A | Discharge status: Home / Self Care | Payer: Medicare Other | Source: Ambulatory Visit | Attending: Family Medicine | Admitting: Family Medicine

## 2020-05-29 ENCOUNTER — Other Ambulatory Visit: Payer: Self-pay

## 2020-05-29 ENCOUNTER — Other Ambulatory Visit: Payer: Self-pay | Admitting: Family Medicine

## 2020-05-29 DIAGNOSIS — M5416 Radiculopathy, lumbar region: Secondary | ICD-10-CM

## 2020-06-25 ENCOUNTER — Other Ambulatory Visit: Payer: Self-pay | Admitting: Family Medicine

## 2020-06-25 DIAGNOSIS — I700 Atherosclerosis of aorta: Secondary | ICD-10-CM

## 2020-07-02 ENCOUNTER — Ambulatory Visit: Payer: 59

## 2020-07-18 ENCOUNTER — Ambulatory Visit: Payer: 59 | Admitting: Cardiology

## 2020-08-15 ENCOUNTER — Ambulatory Visit: Payer: 59 | Admitting: Cardiovascular Disease

## 2020-08-15 ENCOUNTER — Other Ambulatory Visit: Payer: Self-pay

## 2020-08-15 ENCOUNTER — Ambulatory Visit (INDEPENDENT_AMBULATORY_CARE_PROVIDER_SITE_OTHER): Payer: Medicare Other | Admitting: Cardiovascular Disease

## 2020-08-15 ENCOUNTER — Encounter: Payer: Self-pay | Admitting: Cardiovascular Disease

## 2020-08-15 VITALS — BP 114/64 | HR 67 | Ht 66.0 in | Wt 185.4 lb

## 2020-08-15 DIAGNOSIS — R011 Cardiac murmur, unspecified: Secondary | ICD-10-CM | POA: Diagnosis not present

## 2020-08-15 DIAGNOSIS — R0989 Other specified symptoms and signs involving the circulatory and respiratory systems: Secondary | ICD-10-CM

## 2020-08-15 NOTE — Patient Instructions (Signed)
Medication Instructions:  No changes *If you need a refill on your cardiac medications before your next appointment, please call your pharmacy*  Lab Work: none  Testing/Procedures: Your physician has requested that you have an echocardiogram. Echocardiography is a painless test that uses sound waves to create images of your heart. It provides your doctor with information about the size and shape of your heart and how well your heart's chambers and valves are working. This procedure takes approximately one hour. There are no restrictions for this procedure.  Your physician has requested that you have a carotid duplex. This test is an ultrasound of the carotid arteries in your neck. It looks at blood flow through these arteries that supply the brain with blood. Allow one hour for this exam. There are no restrictions or special instructions.  Follow-Up: At [facility], you and your health needs are our priority.  As part of our continuing mission to provide you with exceptional heart care, we have created designated Provider Care Teams.  These Care Teams include your primary Cardiologist (physician) and Advanced Practice Providers (APPs -  Physician Assistants and Nurse Practitioners) who all work together to provide you with the care you need, when you need it.   Your next appointment:   12 month(s)  The format for your next appointment:   In Person  Provider:   You may see Lauree Chandler, MD or one of the following Advanced Practice Providers on your designated Care Team:    Melina Copa, PA-C  Ermalinda Barrios, PA-C  Other Instructions

## 2020-08-15 NOTE — Progress Notes (Signed)
Chief Complaint  Patient presents with  . New Patient (Initial Visit)    HTN    History of Present Illness:64 yo male with history of HTN, HLD, HIV, GERD, prior DVT/PE, Hepatitis B who is here today as a new patient to re-establish cardiac care. I saw him in my office in 2012 for pre-operative risk assessment. He has no prior cardiac history. Echo in 2016 with LVEF=60-65%. Mildly calcified aortic valve leaflets with no evidence of aortic stenosis. DVT/PE in 2016 and he has been on Eliquis since then. Abdominal u/s 07/02/20 with no evidence of AAA. LDL 63 in July 2021.   He tells me today that he feels well overall. The patient denies any chest pain, dyspnea, palpitations, lower extremity edema, orthopnea, PND, dizziness, near syncope or syncope.   Primary Care Physician: Hayden Rasmussen, MD   Past Medical History:  Diagnosis Date  . Anxiety   . Childhood asthma   . DVT (deep venous thrombosis) ([place]) 07/2015; 10/01/2015   RLE; RLE  . GERD (gastroesophageal reflux disease)   . Hepatitis B   . HIV (human immunodeficiency virus infection) ([place])   . HTN (hypertension)   . Hyperlipidemia   . Pneumonia ~ 2000  . Pulmonary embolism ([place]) 12/2013  . Saddle pulmonary embolus ([place]) 07/2015    Past Surgical History:  Procedure Laterality Date  . ANTERIOR CERVICAL DECOMP/DISCECTOMY FUSION  2010   C40-C5  . APPENDECTOMY    . BACK SURGERY    . COLONOSCOPY    . SHOULDER ARTHROSCOPY W/ ROTATOR CUFF REPAIR Bilateral 2000's   "twice on right; once on the left"  . SHOULDER ARTHROSCOPY WITH SUBACROMIAL DECOMPRESSION Right 12/15/2013   Procedure: RIGHT SHOULDER ARTHROSCOPY WITH EXTENSIVE DEBRIDEMENT OF ROTATOR CUFF,SUBACROMIAL DECOMPRESSION, DEBRIDMENT OF BURSA;  Surgeon: Ninetta Lights, MD;  Location: [facility];  Service: Orthopedics;  Laterality: Right;  . TONSILLECTOMY      Current Outpatient Medications  Medication Sig Dispense Refill  . ALPRAZolam (XANAX XR) 1 MG 24 hr  tablet Take 1 tablet by mouth 3 (three) times daily.    Marland Kitchen atorvastatin (LIPITOR) 10 MG tablet Take 10 mg by mouth daily.    Marland Kitchen BIKTARVY 50-200-25 MG TABS tablet Take 1 tablet by mouth daily.    . DULoxetine (CYMBALTA) 20 MG capsule Take 1 capsule by mouth in the morning and at bedtime.    Marland Kitchen ELIQUIS 5 MG TABS tablet Take 5 mg by mouth 2 (two) times daily.    Marland Kitchen olmesartan-hydrochlorothiazide (BENICAR HCT) 40-12.5 MG tablet Take 1 tablet by mouth daily.    . Olopatadine HCl 0.2 % SOLN INSTILL 1 DROP INTO BOTH EYES EVERY DAY    . omega-3 acid ethyl esters (LOVAZA) 1 G capsule Take 1 g by mouth 2 (two) times daily.    Marland Kitchen PIFELTRO 100 MG TABS tablet Take 100 mg by mouth daily.    . tamsulosin (FLOMAX) 0.4 MG CAPS capsule Take 0.4 mg by mouth daily.    . valACYclovir (VALTREX) 500 MG tablet Take 500 mg by mouth as needed.     . Vitamin D, Ergocalciferol, (DRISDOL) 1.25 MG (50000 UNIT) CAPS capsule Take 50,000 Units by mouth once a week.     No current facility-administered medications for this visit.    Allergies  Allergen Reactions  . Efavirenz Rash    Social History   Socioeconomic History  . Marital status: Divorced    Spouse name: Not on file  . Number of children: Not  on file  . Years of education: Not on file  . Highest education level: Not on file  Occupational History  . Occupation: Retired-clinical research  Tobacco Use  . Smoking status: Never Smoker  . Smokeless tobacco: Never Used  Substance and Sexual Activity  . Alcohol use: Yes    Alcohol/week: 0.0 standard drinks    Comment: Occasional.  . Drug use: No  . Sexual activity: Yes  Other Topics Concern  . Not on file  Social History Narrative  . Not on file   Social Determinants of Health   Financial Resource Strain:   . Difficulty of Paying Living Expenses: Not on file  Food Insecurity:   . Worried About Charity fundraiser in the Last Year: Not on file  . Ran Out of Food in the Last Year: Not on file    Transportation Needs:   . Lack of Transportation (Medical): Not on file  . Lack of Transportation (Non-Medical): Not on file  Physical Activity:   . Days of Exercise per Week: Not on file  . Minutes of Exercise per Session: Not on file  Stress:   . Feeling of Stress : Not on file  Social Connections:   . Frequency of Communication with Friends and Family: Not on file  . Frequency of Social Gatherings with Friends and Family: Not on file  . Attends Religious Services: Not on file  . Active Member of Clubs or Organizations: Not on file  . Attends Archivist Meetings: Not on file  . Marital Status: Not on file  Intimate Partner Violence:   . Fear of Current or Ex-Partner: Not on file  . Emotionally Abused: Not on file  . Physically Abused: Not on file  . Sexually Abused: Not on file    Family History  Problem Relation Age of Onset  . Diabetes Mother   . Heart attack Paternal Grandfather   . Deep vein thrombosis Father   . Allergic rhinitis Father   . Asthma Neg Hx   . Eczema Neg Hx   . Urticaria Neg Hx     Review of Systems:  As stated in the HPI and otherwise negative.   BP 114/64   Pulse 67   Ht 5\' 6"  (1.676 m)   Wt 185 lb 6.4 oz (84.1 kg)   SpO2 98%   BMI 29.92 kg/m   Physical Examination: General: Well developed, well nourished, NAD  HEENT: OP clear, mucus membranes moist  SKIN: warm, dry. No rashes. Neuro: No focal deficits  Musculoskeletal: Muscle strength 5/5 all ext  Psychiatric: Mood and affect normal  Neck: No JVD, right carotid artery bruit. no thyromegaly, no lymphadenopathy.  Lungs:Clear bilaterally, no wheezes, rhonci, crackles Cardiovascular: Regular rate and rhythm. Soft systolic murmur.  Abdomen:Soft. Bowel sounds present. Non-tender.  Extremities: No lower extremity edema. Pulses are 2 + in the bilateral DP/PT.  EKG:  EKG is ordered today. The ekg ordered today demonstrates sinus  Recent Labs: No results found for requested labs  within last 8760 hours.   Lipid Panel    Component Value Date/Time   CHOL 207 (H) 08/17/2007 1942   TRIG 583 (H) 08/17/2007 1942   HDL 39 (L) 08/17/2007 1942   CHOLHDL 5.3 Ratio 08/17/2007 1942   VLDL NOT CALC mg/dL 08/17/2007 1942   [place] See Comment mg/dL 08/17/2007 1942     Wt Readings from Last 3 Encounters:  08/15/20 185 lb 6.4 oz (84.1 kg)  04/12/19 182 lb 6.4 oz (  82.7 kg)  11/24/17 193 lb 8 oz (87.8 kg)    Assessment and Plan:   1. Carotid artery bruit, right: Will arrange carotid artery dopplers to assess.   2. Cardiac murmur: Will arrange echo to assess.   Current medicines are reviewed at length with the patient today.  The patient does not have concerns regarding medicines.  The following changes have been made:  no change  Labs/ tests ordered today include:   Orders Placed This Encounter  Procedures  . EKG 12-Lead  . ECHOCARDIOGRAM COMPLETE  . VAS US CAROTID     Disposition:   FU with me in one year.    Signed, Lauree Chandler, MD 08/15/2020 11:05 AM    [address], [place], [place]  [address] Phone: [phone]; Fax: [phone]

## 2020-08-21 ENCOUNTER — Other Ambulatory Visit: Payer: Self-pay

## 2020-08-21 ENCOUNTER — Ambulatory Visit (HOSPITAL_COMMUNITY)
Admission: RE | Admit: 2020-08-21 | Discharge: 2020-08-21 | Disposition: A | Discharge status: Home / Self Care | Payer: Medicare Other | Source: Ambulatory Visit | Attending: Cardiovascular Disease | Admitting: Cardiovascular Disease

## 2020-08-21 DIAGNOSIS — R0989 Other specified symptoms and signs involving the circulatory and respiratory systems: Secondary | ICD-10-CM | POA: Insufficient documentation

## 2020-08-21 DIAGNOSIS — R011 Cardiac murmur, unspecified: Secondary | ICD-10-CM | POA: Insufficient documentation

## 2020-09-12 ENCOUNTER — Ambulatory Visit (HOSPITAL_COMMUNITY): Payer: Medicare Other | Attending: Cardiovascular Disease

## 2020-09-12 ENCOUNTER — Other Ambulatory Visit: Payer: Self-pay

## 2020-09-12 DIAGNOSIS — R011 Cardiac murmur, unspecified: Secondary | ICD-10-CM

## 2020-09-12 DIAGNOSIS — R0989 Other specified symptoms and signs involving the circulatory and respiratory systems: Secondary | ICD-10-CM | POA: Diagnosis not present

## 2020-09-12 LAB — ECHOCARDIOGRAM COMPLETE
Area-P 1/2: 3.03 cm2
S' Lateral: 2.6 cm

## 2020-11-28 ENCOUNTER — Ambulatory Visit: Payer: Self-pay | Admitting: Urology

## 2020-11-30 ENCOUNTER — Telehealth: Payer: Self-pay | Admitting: Cardiovascular Disease

## 2020-11-30 MED ORDER — ICOSAPENT ETHYL 1 G PO CAPS: 2.0000 g | ORAL_CAPSULE | Freq: Two times a day (BID) | ORAL | 11 refills | Status: DC

## 2020-11-30 MED ORDER — ICOSAPENT ETHYL 1 G PO CAPS: 2.0000 g | ORAL_CAPSULE | Freq: Two times a day (BID) | ORAL | 3 refills | Status: DC

## 2020-11-30 NOTE — Telephone Encounter (Signed)
He has Lovaza on his med list but agree I don't see that we've prescribed it for him in the past. His TG were elevated at 272 on labs from July 2021. We don't have his active insurance scanned in so I can't look at his formulary to see what else is preferred.   Spoke with pt, he has [employer] and states that his insurance has switched back and forth between covering brand vs generic Lovaza. His HIV doctor had been prescribing his fish oil, but since he has re-established care with cardiology, he's hoping we can take care of the refills now.  Called in [place] to pt's pharmacy, they do cover Vascepa instead this year, 1 month copay is $19. Pt was very appreciative for assistance.

## 2020-11-30 NOTE — Telephone Encounter (Signed)
    Pt c/o medication issue:  1. Name of Medication:   omega-3 acid ethyl esters (LOVAZA) 1 G capsule    2. How are you currently taking this medication (dosage and times per day)? Take 1 g by mouth 2 (two) times daily.  3. Are you having a reaction (difficulty breathing--STAT)?   4. What is your medication issue? Pt said his insurance no longer covering his Lovaza, he said he needs it to control his triglycerides. He wanted to ask if Dr. Angelena Form can send an alternative prescription for him

## 2021-03-19 ENCOUNTER — Other Ambulatory Visit: Payer: Self-pay

## 2021-03-19 ENCOUNTER — Telehealth: Payer: Self-pay | Admitting: Cardiovascular Disease

## 2021-03-19 MED ORDER — ATORVASTATIN CALCIUM 10 MG PO TABS: 10.0000 mg | ORAL_TABLET | Freq: Every day | ORAL | 1 refills | Status: DC

## 2021-03-19 NOTE — Telephone Encounter (Signed)
*  STAT* If patient is at the pharmacy, call can be transferred to refill team.   1. Which medications need to be refilled? (please list name of each medication and dose if known) atorvastatin (LIPITOR) 10 MG tablet  2. Which pharmacy/location (including street and city if local pharmacy) is medication to be sent to? [id number] - [place], [place] - [address] AT [address]  3. Do they need a 30 day or 90 day supply? [place]

## 2021-04-09 ENCOUNTER — Telehealth: Payer: Self-pay | Admitting: Cardiovascular Disease

## 2021-04-09 NOTE — Telephone Encounter (Signed)
Patient called in saying he received his recall letter to schedule a follow up appointment with Dr. Juanda Crumble. I offered to schedule with an APP and informed him Dr. Camillia Herter schedule is full, but he declined. He states he was just seen 1-2 months ago and also had a CT.  He states after the visit 1-2 months ago Dr. Angelena Form told him that he would see him in 1 year. There is no record of him recently seeing Dr. Angelena Form or having a CT and I informed him of this. He requested a call back from Dr. Camillia Herter nurse to determine whether or not he needs to be seen again in November. Please advise.

## 2021-04-10 NOTE — Telephone Encounter (Signed)
I spoke with patient and adv that he is due for follow up in November and have scheduled him.  He is in agreement with this.

## 2021-08-20 NOTE — Progress Notes (Signed)
Chief Complaint  Patient presents with   Follow-up    HTN   History of Present Illness: 65 yo male with history of HTN, HLD, HIV, GERD, prior DVT/PE, Hepatitis B who is here today for cardiac follow up. I saw him in my office in 2012 for pre-operative risk assessment and again in 2021 to re-establish cardiac care. He has no prior cardiac history. Echo in 2016 with LVEF=60-65%. Mildly calcified aortic valve leaflets with no evidence of aortic stenosis. DVT/PE in 2016 and he has been on Eliquis since then. Abdominal u/s 07/02/20 with no evidence of AAA. LDL 63 in July 2021. Echo December 2021 with LvEF=60-65%. No significant valve disease. Carotid artery dopplers November 2021 with no evidence of carotid artery disease.   He is here today for follow up. The patient denies any chest pain, dyspnea, palpitations, lower extremity edema, orthopnea, PND, dizziness, near syncope or syncope.   Primary Care Physician: Hayden Rasmussen, MD  Past Medical History:  Diagnosis Date   Anxiety    Childhood asthma    DVT (deep venous thrombosis) ([place]) 07/2015; 10/01/2015   RLE; RLE   GERD (gastroesophageal reflux disease)    Hepatitis B    HIV (human immunodeficiency virus infection) ([place])    HTN (hypertension)    Hyperlipidemia    Pneumonia ~ 2000   Pulmonary embolism (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    Past Surgical History:  Procedure Laterality Date   ANTERIOR CERVICAL DECOMP/DISCECTOMY FUSION  2010   C40-C5   APPENDECTOMY     BACK SURGERY     COLONOSCOPY     SHOULDER ARTHROSCOPY W/ ROTATOR CUFF REPAIR Bilateral 2000's   "twice on right; once on the left"   SHOULDER ARTHROSCOPY WITH SUBACROMIAL DECOMPRESSION Right 12/15/2013   Procedure: RIGHT SHOULDER ARTHROSCOPY WITH EXTENSIVE DEBRIDEMENT OF ROTATOR CUFF,SUBACROMIAL DECOMPRESSION, DEBRIDMENT OF BURSA;  Surgeon: Ninetta Lights, MD;  Location: [facility];  Service: Orthopedics;  Laterality: Right;    TONSILLECTOMY      Current Outpatient Medications  Medication Sig Dispense Refill   ALPRAZolam (XANAX XR) 1 MG 24 hr tablet Take 1 tablet by mouth 3 (three) times daily.     BIKTARVY 50-200-25 MG TABS tablet Take 1 tablet by mouth daily.     ELIQUIS 5 MG TABS tablet Take 5 mg by mouth 2 (two) times daily.     icosapent Ethyl (VASCEPA) 1 g capsule Take 2 capsules (2 g total) by mouth 2 (two) times daily. 360 capsule 3   olmesartan-hydrochlorothiazide (BENICAR HCT) 40-12.5 MG tablet Take 1 tablet by mouth daily.     Olopatadine HCl 0.2 % SOLN INSTILL 1 DROP INTO BOTH EYES EVERY DAY     PIFELTRO 100 MG TABS tablet Take 100 mg by mouth daily.     tamsulosin (FLOMAX) 0.4 MG CAPS capsule Take 0.4 mg by mouth daily.     valACYclovir (VALTREX) 500 MG tablet Take 500 mg by mouth as needed.      Vitamin D, Ergocalciferol, (DRISDOL) 1.25 MG (50000 UNIT) CAPS capsule Take 50,000 Units by mouth once a week.     atorvastatin (LIPITOR) 10 MG tablet Take 1 tablet (10 mg total) by mouth daily. 90 tablet 3   finasteride (PROSCAR) 5 MG tablet Take 5 mg by mouth daily.     No current facility-administered medications for this visit.    Allergies  Allergen Reactions   Efavirenz Rash    Social History   Socioeconomic  History   Marital status: Divorced    Spouse name: Not on file   Number of children: Not on file   Years of education: Not on file   Highest education level: Not on file  Occupational History   Occupation: Retired-clinical research  Tobacco Use   Smoking status: Never   Smokeless tobacco: Never  Substance and Sexual Activity   Alcohol use: Yes    Alcohol/week: 0.0 standard drinks    Comment: Occasional.   Drug use: No   Sexual activity: Yes  Other Topics Concern   Not on file  Social History Narrative   Not on file   Social Determinants of Health   Financial Resource Strain: Not on file  Food Insecurity: Not on file  Transportation Needs: Not on file  Physical Activity:  Not on file  Stress: Not on file  Social Connections: Not on file  Intimate Partner Violence: Not on file    Family History  Problem Relation Age of Onset   Diabetes Mother    Heart attack Paternal Grandfather    Deep vein thrombosis Father    Allergic rhinitis Father    Asthma Neg Hx    Eczema Neg Hx    Urticaria Neg Hx     Review of Systems:  As stated in the HPI and otherwise negative.   BP 120/64   Pulse 71   Ht 5\' 6"  (1.676 m)   Wt 192 lb 6.4 oz (87.3 kg)   SpO2 98%   BMI 31.05 kg/m   Physical Examination:  General: Well developed, well nourished, NAD  HEENT: OP clear, mucus membranes moist  SKIN: warm, dry. No rashes. Neuro: No focal deficits  Musculoskeletal: Muscle strength 5/5 all ext  Psychiatric: Mood and affect normal  Neck: No JVD, no carotid bruits, no thyromegaly, no lymphadenopathy.  Lungs:Clear bilaterally, no wheezes, rhonci, crackles Cardiovascular: Regular rate and rhythm. Soft systolic murmur.  Abdomen:Soft. Bowel sounds present. Non-tender.  Extremities: No lower extremity edema. Pulses are 2 + in the bilateral DP/PT.  EKG:  EKG is  ordered today. The ekg ordered today demonstrates Sinus  Echo December 2021:   1. Left ventricular ejection fraction, by estimation, is 60 to 65%. The  left ventricle has normal function. The left ventricle has no regional  wall motion abnormalities. Left ventricular diastolic parameters are  consistent with Grade I diastolic  dysfunction (impaired relaxation).   2. Right ventricular systolic function is normal. The right ventricular  size is normal. Tricuspid regurgitation signal is inadequate for assessing  PA pressure.   3. The mitral valve is normal in structure. Trivial mitral valve  regurgitation. No evidence of mitral stenosis.   4. The aortic valve is grossly normal. There is mild calcification of the  aortic valve. Aortic valve regurgitation is not visualized. Mild aortic  valve sclerosis is present,  with no evidence of aortic valve stenosis.   5. The inferior vena cava is normal in size with greater than 50%  respiratory variability, suggesting right atrial pressure of 3 mmHg.   Recent Labs: No results found for requested labs within last 8760 hours.   Lipid Panel    Component Value Date/Time   CHOL 207 (H) 08/17/2007 1942   TRIG 583 (H) 08/17/2007 1942   HDL 39 (L) 08/17/2007 1942   CHOLHDL 5.3 Ratio 08/17/2007 1942   VLDL NOT CALC mg/dL 08/17/2007 1942   [place] Comment mg/dL 08/17/2007 1942     Wt Readings from Last 3 Encounters:  08/21/21 192 lb 6.4 oz (87.3 kg)  08/15/20 185 lb 6.4 oz (84.1 kg)  04/12/19 182 lb 6.4 oz (82.7 kg)    Assessment and Plan:   1. Cardiac murmur: Aortic valve sclerosis by echo in 2021 with no stenosis.   2. HTN: BP controlled. No changes.   3. Hyperlipidemia: Continue Vascepa and Lipitor. Check lipids and LFTs today.   Current medicines are reviewed at length with the patient today.  The patient does not have concerns regarding medicines.  The following changes have been made:  no change  Labs/ tests ordered today include:   Orders Placed This Encounter  Procedures   Lipid panel   Hepatic function panel   EKG 12-Lead    Disposition:   F/U with me in one year.    Signed, Lauree Chandler, MD 08/21/2021 11:34 AM    [facility] [place], [place], [place]  [address] Phone: [phone]; Fax: [phone]

## 2021-08-21 ENCOUNTER — Encounter: Payer: Self-pay | Admitting: Cardiovascular Disease

## 2021-08-21 ENCOUNTER — Ambulatory Visit (INDEPENDENT_AMBULATORY_CARE_PROVIDER_SITE_OTHER): Payer: Medicare Other | Admitting: Cardiovascular Disease

## 2021-08-21 ENCOUNTER — Other Ambulatory Visit: Payer: Self-pay

## 2021-08-21 VITALS — BP 120/64 | HR 71 | Ht 66.0 in | Wt 192.4 lb

## 2021-08-21 DIAGNOSIS — I10 Essential (primary) hypertension: Secondary | ICD-10-CM

## 2021-08-21 DIAGNOSIS — R011 Cardiac murmur, unspecified: Secondary | ICD-10-CM | POA: Diagnosis not present

## 2021-08-21 DIAGNOSIS — E7800 Pure hypercholesterolemia, unspecified: Secondary | ICD-10-CM | POA: Diagnosis not present

## 2021-08-21 LAB — HEPATIC FUNCTION PANEL
ALT: 43 IU/L (ref 0–44)
AST: 35 IU/L (ref 0–40)
Albumin: 4.6 g/dL (ref 3.8–4.8)
Alkaline Phosphatase: 54 IU/L (ref 44–121)
Bilirubin Total: 0.5 mg/dL (ref 0.0–1.2)
Bilirubin, Direct: 0.14 mg/dL (ref 0.00–0.40)
Total Protein: 6.7 g/dL (ref 6.0–8.5)

## 2021-08-21 LAB — LIPID PANEL
Chol/HDL Ratio: 3.9 ratio (ref 0.0–5.0)
Cholesterol, Total: 166 mg/dL (ref 100–199)
HDL: 43 mg/dL (ref >39)
LDL Chol Calc (NIH): 88 mg/dL (ref 0–99)
Triglycerides: 204 mg/dL — ABNORMAL HIGH (ref 0–149)
VLDL Cholesterol Cal: 35 mg/dL (ref 5–40)

## 2021-08-21 MED ORDER — ATORVASTATIN CALCIUM 10 MG PO TABS: 10.0000 mg | ORAL_TABLET | Freq: Every day | ORAL | 3 refills | Status: DC

## 2021-08-21 NOTE — Patient Instructions (Signed)
Medication Instructions:  No changes *If you need a refill on your cardiac medications before your next appointment, please call your pharmacy*   Lab Work: Today: lipids/liver function test  If you have labs (blood work) drawn today and your tests are completely normal, you will receive your results only by: [place] (if you have MyChart) OR A paper copy in the mail If you have any lab test that is abnormal or we need to change your treatment, we will call you to review the results.   Testing/Procedures: none   Follow-Up: At [facility], you and your health needs are our priority.  As part of our continuing mission to provide you with exceptional heart care, we have created designated Provider Care Teams.  These Care Teams include your primary Cardiologist (physician) and Advanced Practice Providers (APPs -  Physician Assistants and Nurse Practitioners) who all work together to provide you with the care you need, when you need it.  Your next appointment:   12 month(s)  The format for your next appointment:   In Person  Provider:   Lauree Chandler, MD     Other Instructions

## 2021-09-16 ENCOUNTER — Telehealth: Payer: Self-pay

## 2021-09-16 NOTE — Telephone Encounter (Signed)
PA completed on Cover my meds and patient was approved for Vascepa 1GM.   KEY: [id number]  Pt was notified.

## 2021-09-18 ENCOUNTER — Other Ambulatory Visit: Payer: Self-pay | Admitting: Cardiovascular Disease

## 2021-12-17 ENCOUNTER — Other Ambulatory Visit: Payer: Self-pay | Admitting: Cardiovascular Disease

## 2021-12-19 ENCOUNTER — Telehealth: Payer: Self-pay | Admitting: Cardiovascular Disease

## 2021-12-19 NOTE — Telephone Encounter (Signed)
Pt c/o medication issue: ? ?1. Name of Medication: icosapent Ethyl (VASCEPA) 1 g capsule ? ?2. How are you currently taking this medication (dosage and times per day)? As prescribed  ? ?3. Are you having a reaction (difficulty breathing--STAT)? No  ? ?4. What is your medication issue? Patient called in stating this medication was not approved. I reached out to the pharmacy while the patient was on the line for confirmation if they were needing the refill resent or if PA was needed. Pharmacist stated he is needing PA and he advised he sent it over via CoverMyMeds. Please advise.  ?

## 2021-12-23 NOTE — Telephone Encounter (Signed)
**Note De-Identified [facility] Obfuscation** Per covermymeds the pts Icsapent was approved for coverage until 09/16/2022. ? ?I called [facility] and s/w the pharmacist who advised me that the pts Icosapent is ready for pick up but that the pt does not want to pay $38.69 for a 90 day supply. ? ?I called the pt to discuss and he states that he is picking his Icosapent up today and has no problem with this cost. ? ?He thanked me for our assistance. ?

## 2022-02-07 ENCOUNTER — Ambulatory Visit
Admission: RE | Admit: 2022-02-07 | Discharge: 2022-02-07 | Disposition: A | Discharge status: Home / Self Care | Payer: Medicare Other | Source: Ambulatory Visit | Attending: Family Medicine | Admitting: Family Medicine

## 2022-02-07 ENCOUNTER — Other Ambulatory Visit: Payer: Self-pay | Admitting: Family Medicine

## 2022-02-07 DIAGNOSIS — M5416 Radiculopathy, lumbar region: Secondary | ICD-10-CM

## 2022-09-05 ENCOUNTER — Other Ambulatory Visit: Payer: Self-pay

## 2022-09-05 MED ORDER — ATORVASTATIN CALCIUM 10 MG PO TABS: 10.0000 mg | ORAL_TABLET | Freq: Every day | ORAL | 0 refills | Status: DC

## 2022-11-20 ENCOUNTER — Encounter: Payer: Self-pay | Admitting: Cardiovascular Disease

## 2022-11-20 ENCOUNTER — Ambulatory Visit: Payer: Medicare Other | Attending: Cardiovascular Disease | Admitting: Cardiovascular Disease

## 2022-11-20 VITALS — BP 120/70 | HR 72 | Ht 66.0 in | Wt 187.6 lb

## 2022-11-20 DIAGNOSIS — I10 Essential (primary) hypertension: Secondary | ICD-10-CM | POA: Diagnosis present

## 2022-11-20 DIAGNOSIS — E7800 Pure hypercholesterolemia, unspecified: Secondary | ICD-10-CM | POA: Diagnosis present

## 2022-11-20 DIAGNOSIS — I358 Other nonrheumatic aortic valve disorders: Secondary | ICD-10-CM

## 2022-11-20 NOTE — Patient Instructions (Signed)
Medication Instructions:  No changes *If you need a refill on your cardiac medications before your next appointment, please call your pharmacy*   Lab Work: none   Testing/Procedures: Your physician has requested that you have an echocardiogram. Echocardiography is a painless test that uses sound waves to create images of your heart. It provides your doctor with information about the size and shape of your heart and how well your heart's chambers and valves are working. This procedure takes approximately one hour. There are no restrictions for this procedure. Please do NOT wear cologne, perfume, aftershave, or lotions (deodorant is allowed). Please arrive 15 minutes prior to your appointment time.   Follow-Up: At [facility], you and your health needs are our priority.  As part of our continuing mission to provide you with exceptional heart care, we have created designated Provider Care Teams.  These Care Teams include your primary Cardiologist (physician) and Advanced Practice Providers (APPs -  Physician Assistants and Nurse Practitioners) who all work together to provide you with the care you need, when you need it.   Your next appointment:   12 month(s)  Provider:   Lauree Chandler, MD

## 2022-11-20 NOTE — Progress Notes (Signed)
Chief Complaint  Patient presents with   Follow-up    CAD   History of Present Illness: 67 yo male with history of HTN, HLD, HIV, GERD, prior DVT/PE, Hepatitis B who is here today for cardiac follow up. I saw him in my office in 2012 for pre-operative risk assessment and again in 2021 to re-establish cardiac care. He has no prior cardiac history. Echo in 2016 with LVEF=60-65%. Mildly calcified aortic valve leaflets with no evidence of aortic stenosis. DVT/PE in 2016 and he has been on Eliquis since then. Abdominal u/s 07/02/20 with no evidence of AAA. Echo December 2021 with LVEF=60-65%. Aortic valve sclerosis. Carotid artery dopplers November 2021 with no evidence of carotid artery disease.   He is here today for follow up. The patient denies any chest pain, dyspnea, palpitations, lower extremity edema, orthopnea, PND, dizziness, near syncope or syncope.   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    Past Surgical History:  Procedure Laterality Date   ANTERIOR CERVICAL DECOMP/DISCECTOMY FUSION  2010   C40-C5   APPENDECTOMY     BACK SURGERY     COLONOSCOPY     SHOULDER ARTHROSCOPY W/ ROTATOR CUFF REPAIR Bilateral 2000's   "twice on right; once on the left"   SHOULDER ARTHROSCOPY WITH SUBACROMIAL DECOMPRESSION Right 12/15/2013   Procedure: RIGHT SHOULDER ARTHROSCOPY WITH EXTENSIVE DEBRIDEMENT OF ROTATOR CUFF,SUBACROMIAL DECOMPRESSION, DEBRIDMENT OF BURSA;  Surgeon: Ninetta Lights, MD;  Location: [facility];  Service: Orthopedics;  Laterality: Right;   TONSILLECTOMY      Current  Outpatient Medications  Medication Sig Dispense Refill   ALPRAZolam (XANAX XR) 1 MG 24 hr tablet Take 1 tablet by mouth 3 (three) times daily.     atorvastatin (LIPITOR) 10 MG tablet Take 1 tablet (10 mg total) by mouth daily. 90 tablet 0   BIKTARVY 50-200-25 MG TABS tablet Take 1 tablet by mouth daily.     ELIQUIS 5 MG TABS tablet Take 5 mg by mouth 2 (two) times daily.     finasteride (PROSCAR) 5 MG tablet Take 5 mg by mouth daily.     icosapent Ethyl (VASCEPA) 1 g capsule TAKE 2 CAPSULES BY MOUTH TWICE DAILY. 360 capsule 3   olmesartan-hydrochlorothiazide (BENICAR HCT) 40-12.5 MG tablet Take 1 tablet by mouth daily.     PIFELTRO 100 MG TABS tablet Take 100 mg by mouth daily.     tamsulosin (FLOMAX) 0.4 MG CAPS capsule Take 0.4 mg by mouth daily.     valACYclovir (VALTREX) 500 MG tablet Take 500 mg by mouth as needed.      Vitamin D, Ergocalciferol, (DRISDOL) 1.25 MG (50000 UNIT) CAPS capsule Take 50,000 Units by mouth once a week.     No current facility-administered medications for this visit.    Allergies  Allergen Reactions   Efavirenz Rash    Social History   Socioeconomic History   Marital status: Divorced    Spouse name: Not on file   Number of children: Not on file   Years of education: Not  on file   Highest education level: Not on file  Occupational History   Occupation: Retired-clinical research  Tobacco Use   Smoking status: Never   Smokeless tobacco: Never  Substance and Sexual Activity   Alcohol use: Yes    Alcohol/week: 0.0 standard drinks of alcohol    Comment: Occasional.   Drug use: No   Sexual activity: Yes  Other Topics Concern   Not on file  Social History Narrative   Not on file   Social Determinants of Health   Financial Resource Strain: Not on file  Food Insecurity: Not on file  Transportation Needs: Not on file  Physical Activity: Not on file  Stress: Not on file  Social Connections: Not on file  Intimate Partner Violence: Not on file     Family History  Problem Relation Age of Onset   Diabetes Mother    Heart attack Paternal Grandfather    Deep vein thrombosis Father    Allergic rhinitis Father    Asthma Neg Hx    Eczema Neg Hx    Urticaria Neg Hx     Review of Systems:  As stated in the HPI and otherwise negative.   BP 120/70   Pulse 72   Ht 5' 6"$  (1.676 m)   Wt 85.1 kg   SpO2 97%   BMI 30.28 kg/m   Physical Examination:  General: Well developed, well nourished, NAD  HEENT: OP clear, mucus membranes moist  SKIN: warm, dry. No rashes. Neuro: No focal deficits  Musculoskeletal: Muscle strength 5/5 all ext  Psychiatric: Mood and affect normal  Neck: No JVD, no carotid bruits, no thyromegaly, no lymphadenopathy.  Lungs:Clear bilaterally, no wheezes, rhonci, crackles Cardiovascular: Regular rate and rhythm. Systolic murmur.  Abdomen:Soft. Bowel sounds present. Non-tender.  Extremities: No lower extremity edema. Pulses are 2 + in the bilateral DP/PT.  EKG:  EKG is ordered today. The ekg ordered today demonstrates NSR  Echo December 2021:  1. Left ventricular ejection fraction, by estimation, is 60 to 65%. The  left ventricle has normal function. The left ventricle has no regional  wall motion abnormalities. Left ventricular diastolic parameters are  consistent with Grade I diastolic  dysfunction (impaired relaxation).   2. Right ventricular systolic function is normal. The right ventricular  size is normal. Tricuspid regurgitation signal is inadequate for assessing  PA pressure.   3. The mitral valve is normal in structure. Trivial mitral valve  regurgitation. No evidence of mitral stenosis.   4. The aortic valve is grossly normal. There is mild calcification of the  aortic valve. Aortic valve regurgitation is not visualized. Mild aortic  valve sclerosis is present, with no evidence of aortic valve stenosis.   5. The inferior vena cava is normal in size with greater than 50%  respiratory  variability, suggesting right atrial pressure of 3 mmHg.   Recent Labs: No results found for requested labs within last 365 days.   Lipid Panel    Component Value Date/Time   CHOL 166 08/21/2021 1116   TRIG 204 (H) 08/21/2021 1116   HDL 43 08/21/2021 1116   CHOLHDL 3.9 08/21/2021 1116   CHOLHDL 5.3 Ratio 08/17/2007 1942   VLDL NOT CALC mg/dL 08/17/2007 1942   LDLCALC 88 08/21/2021 1116     Wt Readings from Last 3 Encounters:  11/20/22 85.1 kg  08/21/21 87.3 kg  08/15/20 84.1 kg   Assessment and Plan:   1. Cardiac murmur/Aortic valve disease: Aortic valve sclerosis by echo in 2021  with no stenosis. Repeat echo now.   2. HTN: BP is well controlled. No changes  3. Hyperlipidemia: Lipids followed in primary care. Continue statin.   4. History of DVT/PE: He remains on Eliqus. He is followed by Hematology.   Labs/ tests ordered today include:   Orders Placed This Encounter  Procedures   EKG 12-Lead   ECHOCARDIOGRAM COMPLETE   Disposition:   F/U with me in one year.   Signed, Lauree Chandler, MD 11/20/2022 11:02 AM    [facility] [place], [place], [place]  [address] Phone: [phone]; Fax: [phone]

## 2022-12-17 ENCOUNTER — Ambulatory Visit (HOSPITAL_COMMUNITY): Payer: Medicare HMO | Attending: Cardiovascular Disease

## 2022-12-17 DIAGNOSIS — I358 Other nonrheumatic aortic valve disorders: Secondary | ICD-10-CM

## 2022-12-17 LAB — ECHOCARDIOGRAM COMPLETE
Area-P 1/2: 3.1 cm2
S' Lateral: 2.9 cm

## 2022-12-24 ENCOUNTER — Other Ambulatory Visit: Payer: Self-pay | Admitting: Cardiovascular Disease

## 2022-12-25 ENCOUNTER — Other Ambulatory Visit: Payer: Self-pay

## 2022-12-25 MED ORDER — ATORVASTATIN CALCIUM 10 MG PO TABS: 10.0000 mg | ORAL_TABLET | Freq: Every day | ORAL | 3 refills | Status: DC

## 2022-12-26 ENCOUNTER — Telehealth: Payer: Self-pay | Admitting: Cardiovascular Disease

## 2022-12-26 MED ORDER — VASCEPA 1 G PO CAPS: 2.0000 g | ORAL_CAPSULE | Freq: Two times a day (BID) | ORAL | 11 refills | Status: DC

## 2022-12-26 NOTE — Telephone Encounter (Signed)
Pulled up pt's formulary, generic icosapent ethyl is not covered on his plan but brand name Vascepa is as a tier 3 med with $45/month copay. Pt aware, new rx has been sent in.

## 2022-12-26 NOTE — Telephone Encounter (Signed)
Pt c/o medication issue:  1. Name of Medication:   icosapent Ethyl (VASCEPA) 1 g capsule    2. How are you currently taking this medication (dosage and times per day)?   TAKE 2 CAPSULES BY MOUTH TWICE DAILY.    3. Are you having a reaction (difficulty breathing--STAT)? No  4. What is your medication issue? Pt states that he received a letter from insurance stating that medication is not covered. He would like a callback regarding this matter

## 2023-01-30 ENCOUNTER — Ambulatory Visit: Payer: Medicare HMO | Admitting: Orthopaedic Surgery

## 2023-01-30 DIAGNOSIS — M25551 Pain in right hip: Secondary | ICD-10-CM | POA: Diagnosis not present

## 2023-01-30 MED ORDER — METHYLPREDNISOLONE ACETATE 40 MG/ML IJ SUSP
40.0000 mg | INTRAMUSCULAR | Status: AC | PRN
  Administered 2023-01-30: 40 mg via INTRA_ARTICULAR

## 2023-01-30 MED ORDER — BUPIVACAINE HCL 0.5 % IJ SOLN
3.0000 mL | INTRAMUSCULAR | Status: AC | PRN
  Administered 2023-01-30: 3 mL via INTRA_ARTICULAR

## 2023-01-30 MED ORDER — LIDOCAINE HCL 1 % IJ SOLN
3.0000 mL | INTRAMUSCULAR | Status: AC | PRN
  Administered 2023-01-30: 3 mL

## 2023-01-30 NOTE — Progress Notes (Signed)
Office Visit Note   Patient: Michael [name]           Date of Birth: March 20, 1956           MRN: [id number] Visit Date: 01/30/2023              Requested by: Michael Davenport, MD [address] [place],  [place] [address] PCP: Michael Davenport, MD   Assessment & Plan: Visit Diagnoses:  1. Pain in right hip     Plan: I reviewed the MRI report which does show degenerative changes and stenosis at L4-5 and L5-S1 however I think his symptoms are more consistent with IT band issues therefore we agreed to try an injection.  I have also provided him with IT band exercises to do at home.  If this is not effective then we will need to consider referral for ESI's.  Follow-Up Instructions: No follow-ups on file.   Orders:  No orders of the defined types were placed in this encounter.  No orders of the defined types were placed in this encounter.     Procedures: Large Joint Inj: R greater trochanter on 01/30/2023 3:53 PM Indications: pain Details: 22 G needle  Arthrogram: No  Medications: 3 mL lidocaine 1 %; 3 mL bupivacaine 0.5 %; 40 mg methylPREDNISolone acetate 40 MG/ML Patient was prepped and draped in the usual sterile fashion.       Clinical Data: No additional findings.   Subjective: Chief Complaint  Patient presents with   Lower Back - Pain    HPI  Patient is a 67 year old gentleman here for chronic right hip and back pain.  His main pain starts from the lateral hip and radiates down to the lateral knee.  He has been seeing Michael [name] at [facility] and he has had an MRI which shows stenosis at L4-5 and L5-S1.  He has not yet had an epidural steroid injection.  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  Review of Systems  Constitutional: Negative.   HENT: Negative.    Eyes: Negative.   Respiratory: Negative.    Cardiovascular: Negative.   Gastrointestinal: Negative.   Endocrine: Negative.   Genitourinary: Negative.   Skin: Negative.    Allergic/Immunologic: Negative.   Neurological: Negative.   Hematological: Negative.   Psychiatric/Behavioral: Negative.    All other systems reviewed and are negative.    Objective: Vital Signs: There were no vitals taken for this visit.  Physical Exam Vitals and nursing note reviewed.  Constitutional:      Appearance: He is well-developed.  HENT:     Head: Normocephalic and atraumatic.  Eyes:     Pupils: Pupils are equal, round, and reactive to light.  Pulmonary:     Effort: Pulmonary effort is normal.  Abdominal:     Palpations: Abdomen is soft.  Musculoskeletal:        General: Normal range of motion.     Cervical back: Neck supple.  Skin:    General: Skin is warm.  Neurological:     Mental Status: He is alert and oriented to person, place, and time.  Psychiatric:        Behavior: Behavior normal.        Thought Content: Thought content normal.        Judgment: Judgment normal.     Ortho Exam  Examination of the lumbar spine and right hip shows exquisite tenderness to the trochanteric region.  Positive Claiborne Rigg  sign.  No sciatic tension signs.  No significant tenderness to the lumbar spine.  Specialty Comments:  No specialty comments available.  Imaging: No results found.   PMFS History: Patient Active Problem List   Diagnosis Date Noted   Acute deep vein thrombosis (DVT) of right lower extremity (HCC) 10/01/2015   Sinusitis 10/01/2015   Hypokalemia 10/01/2015   DVT (deep venous thrombosis) (HCC) 10/01/2015   Encounter for therapeutic drug monitoring 08/03/2015   Essential hypertension 07/29/2015   Hyperlipidemia 07/29/2015   Pneumonia 02/01/2014   HIV disease (HCC) 02/01/2014   Acute pulmonary embolism (HCC) 02/01/2014   Pulmonary embolism (HCC) 01/31/2014   Impingement syndrome of right shoulder 12/15/2013   Sacroiliac joint pain 04/12/2013   Pain in joint, lower leg 02/24/2013   Pain in left hip 02/24/2013   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  02/24/2013   Abnormal EKG 07/07/2011   Preop cardiovascular exam 07/07/2011   ABDOMINAL PAIN, EPIGASTRIC 03/09/2009   UNSPECIFIED LABYRINTHITIS 01/24/2009   FOOT PAIN, LEFT 11/03/2008   MORTON'S NEUROMA, 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   PLANTAR FASCIITIS, BILATERAL 09/26/2008   Hepatitis B virus infection 10/18/2007   VENEREAL WART 10/18/2007   LIVER HEMANGIOMA 10/18/2007   HYPERLIPIDEMIA 10/18/2007   DEPRESSION 10/18/2007   HAY FEVER 10/18/2007   PNEUMONIA 10/18/2007   ASTHMA, CHILDHOOD 10/18/2007   GASTROESOPHAGEAL REFLUX DISEASE 10/18/2007   Other acquired absence of organ 10/18/2007   ROTATOR CUFF REPAIR, RIGHT, HX OF 10/18/2007   HIV DISEASE 08/05/2007   Past Medical History:  Diagnosis Date   Anxiety    Childhood asthma    DVT (deep venous thrombosis) (HCC) 07/2015; 10/01/2015   RLE; RLE   GERD (gastroesophageal reflux disease)    Hepatitis B    HIV (human immunodeficiency virus infection) (HCC)    HTN (hypertension)    Hyperlipidemia    Pneumonia ~ 2000   Pulmonary embolism (HCC) 12/2013   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    Family History  Problem Relation Age of Onset   Diabetes Mother    Heart attack Paternal Grandfather    Deep vein thrombosis Father    Allergic rhinitis Father    Asthma Neg Hx    Eczema Neg Hx    Urticaria Neg Hx     Past Surgical History:  Procedure Laterality Date   ANTERIOR CERVICAL DECOMP/DISCECTOMY FUSION  2010   C40-C5   APPENDECTOMY     BACK SURGERY     COLONOSCOPY     SHOULDER ARTHROSCOPY W/ ROTATOR CUFF REPAIR Bilateral 2000's   "twice on right; once on the left"   SHOULDER ARTHROSCOPY WITH SUBACROMIAL DECOMPRESSION Right 12/15/2013   Procedure: RIGHT SHOULDER ARTHROSCOPY WITH EXTENSIVE DEBRIDEMENT OF ROTATOR CUFF,SUBACROMIAL DECOMPRESSION, DEBRIDMENT OF BURSA;  Surgeon: Loreta Ave, MD;  Location: [facility];  Service: Orthopedics;  Laterality: Right;   TONSILLECTOMY     Social History   Occupational History    Occupation: Retired-clinical research  Tobacco Use   Smoking status: Never   Smokeless tobacco: Never  Substance and Sexual Activity   Alcohol use: Yes    Alcohol/week: 0.0 standard drinks of alcohol    Comment: Occasional.   Drug use: No   Sexual activity: Yes

## 2023-02-03 ENCOUNTER — Telehealth: Payer: Self-pay | Admitting: Orthopaedic Surgery

## 2023-02-03 NOTE — Telephone Encounter (Signed)
Have him put ice on it.  Probably getting a little post injection flare up.  Should resolve in a week

## 2023-02-03 NOTE — Telephone Encounter (Signed)
Called and notified patient.

## 2023-02-03 NOTE — Telephone Encounter (Signed)
Patient came in and got his first Cortisone injection and it was red and puffy then at the injection site, and it is still red and puffy with irritation please advise

## 2023-02-16 NOTE — Progress Notes (Unsigned)
Office Visit Note   Patient: Michael [name]           Date of Birth: Nov 15, 1955           MRN: [id number] Visit Date: 02/17/2023              Requested by: Michael Davenport, MD [address] [place],  [place] [address] PCP: Michael Davenport, MD   Assessment & Plan: Visit Diagnoses:  1. Lumbar radiculopathy     Plan: Based on partial relief from the trochanteric injection I do feel that his symptoms are coming from his back.  He has an MRI from [facility] that I have reviewed.  At this point we will make a referral for lumbar spine ESI with Dr. Alvester Morin.    Follow-Up Instructions: No follow-ups on file.   Orders:  No orders of the defined types were placed in this encounter.  No orders of the defined types were placed in this encounter.     Procedures: No procedures performed   Clinical Data: No additional findings.   Subjective: No chief complaint on file.   HPI Michael [name] is a 68 year old gentleman who returns today for continued right hip and knee pain.  The he reports morning back pain and stiffness.  Had a trochanteric injection about 2 weeks ago which helped partially. Review of Systems   Objective: Vital Signs: There were no vitals taken for this visit.  Physical Exam  Ortho Exam Examination of the right lower extremity is unchanged. Specialty Comments:  No specialty comments available.  Imaging: No results found.   PMFS History: Patient Active Problem List   Diagnosis Date Noted   Acute deep vein thrombosis (DVT) of right lower extremity (HCC) 10/01/2015   Sinusitis 10/01/2015   Hypokalemia 10/01/2015   DVT (deep venous thrombosis) (HCC) 10/01/2015   Encounter for therapeutic drug monitoring 08/03/2015   Essential hypertension 07/29/2015   Hyperlipidemia 07/29/2015   Pneumonia 02/01/2014   HIV disease (HCC) 02/01/2014   Acute pulmonary embolism (HCC) 02/01/2014   Pulmonary embolism (HCC) 01/31/2014   Impingement syndrome of right  shoulder 12/15/2013   Sacroiliac joint pain 04/12/2013   Pain in joint, lower leg 02/24/2013   Pain in left hip 02/24/2013   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 02/24/2013   Abnormal EKG 07/07/2011   Preop cardiovascular exam 07/07/2011   ABDOMINAL PAIN, EPIGASTRIC 03/09/2009   UNSPECIFIED LABYRINTHITIS 01/24/2009   FOOT PAIN, LEFT 11/03/2008   MORTON'S NEUROMA, 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   PLANTAR FASCIITIS, BILATERAL 09/26/2008   Hepatitis B virus infection 10/18/2007   VENEREAL WART 10/18/2007   LIVER HEMANGIOMA 10/18/2007   HYPERLIPIDEMIA 10/18/2007   DEPRESSION 10/18/2007   HAY FEVER 10/18/2007   PNEUMONIA 10/18/2007   ASTHMA, CHILDHOOD 10/18/2007   GASTROESOPHAGEAL REFLUX DISEASE 10/18/2007   Other acquired absence of organ 10/18/2007   ROTATOR CUFF REPAIR, RIGHT, HX OF 10/18/2007   HIV DISEASE 08/05/2007   Past Medical History:  Diagnosis Date   Anxiety    Childhood asthma    DVT (deep venous thrombosis) (HCC) 07/2015; 10/01/2015   RLE; RLE   GERD (gastroesophageal reflux disease)    Hepatitis B    HIV (human immunodeficiency virus infection) (HCC)    HTN (hypertension)    Hyperlipidemia    Pneumonia ~ 2000   Pulmonary embolism (HCC) 12/2013   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    Family History  Problem Relation Age of Onset   Diabetes Mother  Heart attack Paternal Grandfather    Deep vein thrombosis Father    Allergic rhinitis Father    Asthma Neg Hx    Eczema Neg Hx    Urticaria Neg Hx     Past Surgical History:  Procedure Laterality Date   ANTERIOR CERVICAL DECOMP/DISCECTOMY FUSION  2010   C40-C5   APPENDECTOMY     BACK SURGERY     COLONOSCOPY     SHOULDER ARTHROSCOPY W/ ROTATOR CUFF REPAIR Bilateral 2000's   "twice on right; once on the left"   SHOULDER ARTHROSCOPY WITH SUBACROMIAL DECOMPRESSION Right 12/15/2013   Procedure: RIGHT SHOULDER ARTHROSCOPY WITH EXTENSIVE DEBRIDEMENT OF ROTATOR CUFF,SUBACROMIAL DECOMPRESSION, DEBRIDMENT OF BURSA;  Surgeon:  Loreta Ave, MD;  Location: [facility];  Service: Orthopedics;  Laterality: Right;   TONSILLECTOMY     Social History   Occupational History   Occupation: Retired-clinical research  Tobacco Use   Smoking status: Never   Smokeless tobacco: Never  Substance and Sexual Activity   Alcohol use: Yes    Alcohol/week: 0.0 standard drinks of alcohol    Comment: Occasional.   Drug use: No   Sexual activity: Yes

## 2023-02-17 ENCOUNTER — Ambulatory Visit: Payer: Medicare HMO | Admitting: Orthopaedic Surgery

## 2023-02-17 DIAGNOSIS — M5416 Radiculopathy, lumbar region: Secondary | ICD-10-CM | POA: Diagnosis not present

## 2023-02-17 NOTE — Addendum Note (Signed)
Addended by: Wendi Maya on: 02/17/2023 10:26 AM   Modules accepted: Orders

## 2023-02-23 ENCOUNTER — Telehealth: Payer: Self-pay | Admitting: Orthopaedic Surgery

## 2023-02-23 NOTE — Telephone Encounter (Signed)
Patient asking for letter stating that he has no limitations in his personal Training. For his P/T. Please call patient to get instructions

## 2023-02-24 ENCOUNTER — Telehealth: Payer: Self-pay | Admitting: Cardiovascular Disease

## 2023-02-24 NOTE — Telephone Encounter (Signed)
Note done. Lvm for pt advising him

## 2023-02-24 NOTE — Telephone Encounter (Signed)
Patient wants to know if Dr. Clifton James will take over his haematology care.  Patient stated if not patient wants Dr. Clifton James to recommend a haematologist in [place].

## 2023-02-24 NOTE — Telephone Encounter (Signed)
yes

## 2023-02-25 NOTE — Telephone Encounter (Signed)
Reviewed with Dr. Clifton James. Provided contact information for Dr. Truett Perna, sent through [facility].

## 2023-03-04 ENCOUNTER — Other Ambulatory Visit: Payer: Self-pay | Admitting: *Deleted

## 2023-03-04 ENCOUNTER — Encounter: Payer: Self-pay | Admitting: Physical Medicine and Rehabilitation

## 2023-03-04 ENCOUNTER — Other Ambulatory Visit: Payer: Self-pay

## 2023-03-04 ENCOUNTER — Ambulatory Visit: Payer: Medicare HMO | Admitting: Physical Medicine and Rehabilitation

## 2023-03-04 DIAGNOSIS — M5416 Radiculopathy, lumbar region: Secondary | ICD-10-CM

## 2023-03-04 DIAGNOSIS — M5441 Lumbago with sciatica, right side: Secondary | ICD-10-CM

## 2023-03-04 DIAGNOSIS — M5442 Lumbago with sciatica, left side: Secondary | ICD-10-CM | POA: Diagnosis not present

## 2023-03-04 DIAGNOSIS — G8929 Other chronic pain: Secondary | ICD-10-CM

## 2023-03-04 DIAGNOSIS — I2699 Other pulmonary embolism without acute cor pulmonale: Secondary | ICD-10-CM

## 2023-03-04 NOTE — Telephone Encounter (Signed)
Pt called in and stated that Dr Truett Perna office needs a a referral from Dr Clifton James.    There phone number is [phone]

## 2023-03-04 NOTE — Progress Notes (Signed)
   03/04/23 1008  Fall Screening  Falls in the past year? 0  Number of falls in past year 0  Was there an injury with Fall? 0  Fall Risk Category Calculator 0  Fall Risk  Patient at Risk for Falls Due to No Fall Risks  Fall risk Follow up Falls evaluation completed;Education provided

## 2023-03-04 NOTE — Progress Notes (Signed)
Michael [name] - 67 y.o. male MRN [id number]  Date of birth: [birth year]  Office Visit Note: Visit Date: 03/04/2023 PCP: Dois Davenport, MD Referred by: Dois Davenport, MD  Subjective: Chief Complaint  Patient presents with   Lower Back - Pain   HPI: Michael [name] is a 67 y.o. male who comes in today per the request of Dr. Glee Arvin for evaluation of chronic, worsening and severe bilateral lower back radiating to thighs down to knees. Pain ongoing for over a years. Denies aggravating factors, states his pain can occur anytime. He describes pain as deep aching sensation, currently rates as 7 out of 10. Biggest discomfort seems to be upper thighs and knees. Some relief of pain with home exercise regimen, rest and use of medications. History of formal physical therapy/aquatic therapy with [facility]. He recently started working with Systems analyst. Lumbar MRI imaging from [facility] in 2023 exhibits facet changes and mild bilateral lateral recess narrowing at L4-L5. No high grade spinal canal stenosis. He was previously followed by Dr. Ranee Gosselin with [facility]. No history of lumbar surgery/injections. 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 States he has become much more sedentary since retirement. He does have history of multiple pulmonary embolisms, he is currently taking Eliquis. Patient denies focal weakness, numbness and tingling. No recent trauma or falls.    Oswestry Disability Index Score 38% 10 to 20 (40%) moderate disability: The patient experiences more pain and difficulty with sitting, lifting and standing. Travel and social life are more difficult, and they may be disabled from work. Personal care, sexual activity and sleeping are not grossly affected, and the patient can usually be managed by conservative means.  Review of Systems  Musculoskeletal:  Positive for back pain.  Neurological:  Negative for tingling,  sensory change, focal weakness and weakness.  All other systems reviewed and are negative.  Otherwise per HPI.  Assessment & Plan: Visit Diagnoses:    ICD-10-CM   1. Chronic bilateral low back pain with bilateral sciatica  M54.42 Ambulatory referral to Physical Therapy   M54.41    G89.29     2. Lumbar radiculopathy  M54.16 Ambulatory referral to Physical Therapy       Plan: Findings:  Chronic, worsening and severe bilateral lower back pain radiating to thighs down to knees. Patient continues to have severe pain despite good conservative treatments such as PT/aquatic therapy, home exercise regimen, rest and use of medications. Patients clinical presentation and exam are complex, his pain is more non dermatomal. Pain could also indicative of facet joint syndrome.  I discussed lumbar MRI from 2023 with patient today using imaging and spine model. Lumbar MRI imaging from looks good for his age, there is facet arthropathy and mild bilateral lateral recess narrowing at L4-L5. We discussed treatment plan in detail today, would recommend lumbar injection at L5-S1. I discussed injection procedure with him today. Patient would like to hold on injection at this time, would prefer to re-group with physical therapy. I do feel he would benefit from course of physical therapy with a focus on manual treatments and core strengthening. I advised patient he can continue working with personal trainer as tolerated. I encouraged him to let us know how he is doing, if his pain persists or worsens we would recommend proceeding with lumbar epidural steroid injection. No red flag symptoms noted upon exam today.   Dr. Alvester Morin participated with direct patient care including clinical review,  exam when needed and significant portion of diagnostic and treatment plan.     Meds & Orders: No orders of the defined types were placed in this encounter.   Orders Placed This Encounter  Procedures   Ambulatory referral to Physical  Therapy    Follow-up: Return if symptoms worsen or fail to improve.   Procedures: No procedures performed      Clinical History: No specialty comments available.   He reports that he has never smoked. He has never used smokeless tobacco. No results for input(s): "HGBA1C", "LABURIC" in the last 8760 hours.  Objective:  VS:  HT:    WT:   BMI:     BP:   HR: bpm  TEMP: ( )  RESP:  Physical Exam Vitals and nursing note reviewed.  HENT:     Head: Normocephalic and atraumatic.     Right Ear: External ear normal.     Left Ear: External ear normal.     Nose: Nose normal.     Mouth/Throat:     Mouth: Mucous membranes are moist.  Eyes:     Extraocular Movements: Extraocular movements intact.  Cardiovascular:     Rate and Rhythm: Normal rate.     Pulses: Normal pulses.  Pulmonary:     Effort: Pulmonary effort is normal.  Abdominal:     General: Abdomen is flat. There is no distension.  Musculoskeletal:        General: Tenderness present.     Cervical back: Normal range of motion.     Comments: Patient rises from seated position to standing without difficulty. Good lumbar range of motion. No pain noted with facet loading. 5/5 strength noted with bilateral hip flexion, knee flexion/extension, ankle dorsiflexion/plantarflexion and EHL. No clonus noted bilaterally. No pain upon palpation of greater trochanters. No pain with internal/external rotation of bilateral hips. Sensation intact bilaterally. Negative slump test bilaterally. Ambulates without aid, gait steady.     Skin:    General: Skin is warm and dry.     Capillary Refill: Capillary refill takes less than 2 seconds.  Neurological:     General: No focal deficit present.     Mental Status: He is alert and oriented to person, place, and time.  Psychiatric:        Mood and Affect: Mood normal.        Behavior: Behavior normal.     Ortho Exam  Imaging: No results found.  Past Medical/Family/Surgical/Social  History: Medications & Allergies reviewed per EMR, new medications updated. Patient Active Problem List   Diagnosis Date Noted   Acute deep vein thrombosis (DVT) of right lower extremity (HCC) 10/01/2015   Sinusitis 10/01/2015   Hypokalemia 10/01/2015   DVT (deep venous thrombosis) (HCC) 10/01/2015   Encounter for therapeutic drug monitoring 08/03/2015   Essential hypertension 07/29/2015   Hyperlipidemia 07/29/2015   Pneumonia 02/01/2014   HIV disease (HCC) 02/01/2014   Acute pulmonary embolism (HCC) 02/01/2014   Pulmonary embolism (HCC) 01/31/2014   Impingement syndrome of right shoulder 12/15/2013   Sacroiliac joint pain 04/12/2013   Pain in joint, lower leg 02/24/2013   Pain in left hip 02/24/2013   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 02/24/2013   Abnormal EKG 07/07/2011   Preop cardiovascular exam 07/07/2011   ABDOMINAL PAIN, EPIGASTRIC 03/09/2009   UNSPECIFIED LABYRINTHITIS 01/24/2009   FOOT PAIN, LEFT 11/03/2008   MORTON'S NEUROMA, 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   PLANTAR FASCIITIS, BILATERAL 09/26/2008   Hepatitis B virus infection 10/18/2007   VENEREAL  WART 10/18/2007   LIVER HEMANGIOMA 10/18/2007   HYPERLIPIDEMIA 10/18/2007   DEPRESSION 10/18/2007   HAY FEVER 10/18/2007   PNEUMONIA 10/18/2007   ASTHMA, CHILDHOOD 10/18/2007   GASTROESOPHAGEAL REFLUX DISEASE 10/18/2007   Other acquired absence of organ 10/18/2007   ROTATOR CUFF REPAIR, RIGHT, HX OF 10/18/2007   HIV DISEASE 08/05/2007   Past Medical History:  Diagnosis Date   Anxiety    Childhood asthma    DVT (deep venous thrombosis) (HCC) 07/2015; 10/01/2015   RLE; RLE   GERD (gastroesophageal reflux disease)    Hepatitis B    HIV (human immunodeficiency virus infection) (HCC)    HTN (hypertension)    Hyperlipidemia    Pneumonia ~ 2000   Pulmonary embolism (HCC) 12/2013   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   Family History  Problem Relation Age of Onset   Diabetes Mother    Heart attack Paternal Grandfather     Deep vein thrombosis Father    Allergic rhinitis Father    Asthma Neg Hx    Eczema Neg Hx    Urticaria Neg Hx    Past Surgical History:  Procedure Laterality Date   ANTERIOR CERVICAL DECOMP/DISCECTOMY FUSION  2010   C40-C5   APPENDECTOMY     BACK SURGERY     COLONOSCOPY     SHOULDER ARTHROSCOPY W/ ROTATOR CUFF REPAIR Bilateral 2000's   "twice on right; once on the left"   SHOULDER ARTHROSCOPY WITH SUBACROMIAL DECOMPRESSION Right 12/15/2013   Procedure: RIGHT SHOULDER ARTHROSCOPY WITH EXTENSIVE DEBRIDEMENT OF ROTATOR CUFF,SUBACROMIAL DECOMPRESSION, DEBRIDMENT OF BURSA;  Surgeon: Loreta Ave, MD;  Location: [facility];  Service: Orthopedics;  Laterality: Right;   TONSILLECTOMY     Social History   Occupational History   Occupation: Retired-clinical research  Tobacco Use   Smoking status: Never   Smokeless tobacco: Never  Substance and Sexual Activity   Alcohol use: Yes    Alcohol/week: 0.0 standard drinks of alcohol    Comment: Occasional.   Drug use: No   Sexual activity: Yes

## 2023-03-04 NOTE — Progress Notes (Signed)
Functional Pain Scale - descriptive words and definitions  Moderate (4)   Constantly aware of pain, can complete ADLs with modification/sleep marginally affected at times/passive distraction is of no use, but active distraction gives some relief. Moderate range order  Average Pain 5-7  Lower back pain that radiates into both legs to the knees

## 2023-03-05 NOTE — Telephone Encounter (Signed)
Went to place referral order, but referral was put in system yesterday afternoon. Pt made aware and appreciates my call to let him know.

## 2023-03-16 ENCOUNTER — Other Ambulatory Visit: Payer: Self-pay

## 2023-03-16 ENCOUNTER — Ambulatory Visit (INDEPENDENT_AMBULATORY_CARE_PROVIDER_SITE_OTHER): Payer: Medicare HMO | Admitting: Physical Therapy

## 2023-03-16 ENCOUNTER — Encounter: Payer: Self-pay | Admitting: Physical Therapy

## 2023-03-16 DIAGNOSIS — M5441 Lumbago with sciatica, right side: Secondary | ICD-10-CM | POA: Diagnosis not present

## 2023-03-16 DIAGNOSIS — G8929 Other chronic pain: Secondary | ICD-10-CM

## 2023-03-16 DIAGNOSIS — M6281 Muscle weakness (generalized): Secondary | ICD-10-CM

## 2023-03-16 DIAGNOSIS — M5442 Lumbago with sciatica, left side: Secondary | ICD-10-CM | POA: Diagnosis not present

## 2023-03-16 DIAGNOSIS — R262 Difficulty in walking, not elsewhere classified: Secondary | ICD-10-CM

## 2023-03-16 DIAGNOSIS — M5416 Radiculopathy, lumbar region: Secondary | ICD-10-CM

## 2023-03-16 NOTE — Therapy (Signed)
OUTPATIENT PHYSICAL THERAPY THORACOLUMBAR EVALUATION   Patient Name: Michael [name] MRN: [id number] DOB:1956/03/11, 67 y.o., male Today's Date: 03/16/2023  END OF SESSION:  PT End of Session - 03/16/23 1312     Visit Number 1    Number of Visits 12    Date for PT Re-Evaluation 06/12/23    Authorization Type [employer]:    PT Start Time 1300    PT Stop Time 1344    PT Time Calculation (min) 44 min    Activity Tolerance Patient tolerated treatment well    Behavior During Therapy [facility] for tasks assessed/performed             Past Medical History:  Diagnosis Date   Anxiety    Childhood asthma    DVT (deep venous thrombosis) (HCC) 07/2015; 10/01/2015   RLE; RLE   GERD (gastroesophageal reflux disease)    Hepatitis B    HIV (human immunodeficiency virus infection) (HCC)    HTN (hypertension)    Hyperlipidemia    Pneumonia ~ 2000   Pulmonary embolism (HCC) 12/2013   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   Past Surgical History:  Procedure Laterality Date   ANTERIOR CERVICAL DECOMP/DISCECTOMY FUSION  2010   C40-C5   APPENDECTOMY     BACK SURGERY     COLONOSCOPY     SHOULDER ARTHROSCOPY W/ ROTATOR CUFF REPAIR Bilateral 2000's   "twice on right; once on the left"   SHOULDER ARTHROSCOPY WITH SUBACROMIAL DECOMPRESSION Right 12/15/2013   Procedure: RIGHT SHOULDER ARTHROSCOPY WITH EXTENSIVE DEBRIDEMENT OF ROTATOR CUFF,SUBACROMIAL DECOMPRESSION, DEBRIDMENT OF BURSA;  Surgeon: Loreta Ave, MD;  Location: [facility];  Service: Orthopedics;  Laterality: Right;   TONSILLECTOMY     Patient Active Problem List   Diagnosis Date Noted   Acute deep vein thrombosis (DVT) of right lower extremity (HCC) 10/01/2015   Sinusitis 10/01/2015   Hypokalemia 10/01/2015   DVT (deep venous thrombosis) (HCC) 10/01/2015   Encounter for therapeutic drug monitoring 08/03/2015   Essential hypertension 07/29/2015   Hyperlipidemia 07/29/2015   Pneumonia 02/01/2014   HIV disease (HCC)  02/01/2014   Acute pulmonary embolism (HCC) 02/01/2014   Pulmonary embolism (HCC) 01/31/2014   Impingement syndrome of right shoulder 12/15/2013   Sacroiliac joint pain 04/12/2013   Pain in joint, lower leg 02/24/2013   Pain in left hip 02/24/2013   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 02/24/2013   Abnormal EKG 07/07/2011   Preop cardiovascular exam 07/07/2011   ABDOMINAL PAIN, EPIGASTRIC 03/09/2009   UNSPECIFIED LABYRINTHITIS 01/24/2009   FOOT PAIN, LEFT 11/03/2008   MORTON'S NEUROMA, 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   PLANTAR FASCIITIS, BILATERAL 09/26/2008   Hepatitis B virus infection 10/18/2007   VENEREAL WART 10/18/2007   LIVER HEMANGIOMA 10/18/2007   HYPERLIPIDEMIA 10/18/2007   DEPRESSION 10/18/2007   HAY FEVER 10/18/2007   PNEUMONIA 10/18/2007   ASTHMA, CHILDHOOD 10/18/2007   GASTROESOPHAGEAL REFLUX DISEASE 10/18/2007   Other acquired absence of organ 10/18/2007   ROTATOR CUFF REPAIR, RIGHT, HX OF 10/18/2007   HIV DISEASE 08/05/2007    PCP: Dois Davenport, MD   REFERRING PROVIDER: Juanda Chance, NP   REFERRING DIAG: M54.16 (ICD-10-CM) - Lumbar radiculopathy M54.42,M54.41,G89.29 (ICD-10-CM) - Chronic bilateral low back pain with bilateral sciatica  Rationale for Evaluation and Treatment: Rehabilitation  THERAPY DIAG:  Lumbar radiculopathy  Chronic bilateral low back pain with bilateral sciatica  ONSET DATE: a bout a year ago  SUBJECTIVE:  SUBJECTIVE STATEMENT: Pt arriving today for evaluation of chronic bilateral lower back pain which is radiating to thighs down to knees. Pain ongoing for over a year.   He describes pain as deep aching sensation. Biggest discomfort seems to be upper thighs and knees. Some relief of pain with home exercise regimen, rest and use of medications.  PERTINENT  HISTORY:  pulmonary embolisms, anxiety, DVT, asthma, HTN, pneumonia, GERD, HIV  PAIN:  NPRS scale: 7/10, worse pain over the weekend of 10/10 Pain location: bilateral hips and knees expanding from low back Pain description: achy, burning, can be stabbing Aggravating factors: sitting Relieving factors: walking  PRECAUTIONS for DN: blood thinner: Eliquis, HIV   WEIGHT BEARING RESTRICTIONS: No  FALLS:  Has patient fallen in last 6 months? No  LIVING ENVIRONMENT: Lives with: lives with their spouse Lives in: House/apartment Stairs: Yes: Internal: 1 flight  steps; on left going up Has following equipment at home: None  OCCUPATION: retired, Scientist, research (physical sciences)  PLOF: Independent  PATIENT GOALS: stop hurting, get back to working out without pain  Next MD Visit: none made a present time   OBJECTIVE:   DIAGNOSTIC FINDINGS:  FINDINGS: There is no evidence of lumbar spine fracture. Alignment is normal. Disc spaces are maintained. Degenerative endplate osteophytes are again seen at L4-L5 and L5-S1 similar to prior examination. There has been progression of degenerative facet change at L5-S1. Soft tissues demonstrate atherosclerotic calcifications in the aorta.   IMPRESSION: 1. Progressive degenerative changes of facet joints at L5-S1. 2. No evidence for fracture or malalignment.  PATIENT SURVEYS:  03/16/23: FOTO eval:     56%   SCREENING FOR RED FLAGS: Bowel or bladder incontinence: No Cauda equina syndrome: No  COGNITION: Overall cognitive status: WFL normal      SENSATION: WFL  MUSCLE LENGTH: 03/16/23 Hamstrings: Right 70 deg; Left 68 deg   POSTURE:  rounded shoulders and forward head  PALPATION: 03/16/23: TTP: lumbar paraspinals  LUMBAR ROM:   AROM Eval 03/16/23  Flexion 30 c pain  Extension 5 c pain  Right lateral flexion   Left lateral flexion   Right rotation Limited 50% due to pain  Left rotation Limited 50% due to pain   (Blank rows = not  tested)  LOWER EXTREMITY MMT:     MMT  Right 03/16/23 Left 03/16/23  Hip flexion 5 4  Hip extension    Hip abduction 5 4  Hip adduction 5 4  Hip internal rotation    Hip external rotation    Knee flexion 5 4  Knee extension 5 5  Ankle dorsiflexion    Ankle plantarflexion    Ankle inversion    Ankle eversion     (Blank rows = not tested)  LOWER EXTREMITY ROM:    ROM Right 03/16/23 Left 03/16/23  Hip flexion 95 c pain in groin 90 c tightness in groin  Hip extension    Hip abduction    Hip adduction    Hip internal rotation    Hip external rotation    Knee flexion    Knee extension    Ankle dorsiflexion    Ankle plantarflexion    Ankle inversion    Ankle eversion     (Blank rows = not tested)  LUMBAR SPECIAL TESTS:       03/16/23: Straight leg raise test: Positive  FUNCTIONAL TESTS:        03/16/23:  5 times sit to stand: 10 seconds no UE support  GAIT: Distance walked:  30 feet level surface Assistive device utilized: None Level of assistance: Complete Independence Comments: WFL                                                                                                                                                                                                                  TODAY'S TREATMENT:                                                                                                         DATE:  03/16/23:   Therex:   HEP instruction/performance c cues for techniques, handout provided.  Trial set performed of each for comprehension and symptom assessment.  See below for exercise list  PATIENT EDUCATION:  Education details: HEP, POC Person educated: Patient Education method: Explanation, Demonstration, Verbal cues, and Handouts Education comprehension: verbalized understanding, returned demonstration, and verbal cues required  HOME EXERCISE PROGRAM: Access Code: [email or web address] URL: [email or web address] Date: 03/16/2023 Prepared by:  Narda Amber  Exercises - Supine Lower Trunk Rotation  - 1-2 x daily - 7 x weekly - 3 reps - 20 seconds hold - Hooklying Single Knee to Chest Stretch  - 1-2 x daily - 7 x weekly - 3 reps - 20 seconds hold - Supine Figure 4 Piriformis Stretch  - 1-2 x daily - 7 x weekly - 3 reps - 20 seconds hold - Supine Bridge  - 1-2 x daily - 7 x weekly - 10 reps - 5 seconds it ban hold - Supine ITB Stretch with Strap  - 1-2 x daily - 7 x weekly - 3 reps - 20 seconds hold - Standing Hip Flexor Stretch  - 1-2 x daily - 7 x weekly - 3 reps - 20 seconds hold  ASSESSMENT:  CLINICAL IMPRESSION: Patient is a 67 y.o. male who comes to clinic with complaints of low back pain c radiation into thighs and down to his knees. Pt stating pain has been on-going for about a year.  with mobility, strength and movement coordination deficits that impair their ability to perform usual daily and recreational functional activities  without increase difficulty/symptoms at this time.  Patient to benefit from skilled PT services to address impairments and limitations to improve to previous level of function without restriction secondary to condition.   OBJECTIVE IMPAIRMENTS: decreased balance, decreased mobility, difficulty walking, decreased ROM, decreased strength, impaired flexibility, and pain.   ACTIVITY LIMITATIONS: bending, sitting, standing, squatting, and sleeping  PARTICIPATION LIMITATIONS: community activity and occupation  PERSONAL FACTORS: 3+ comorbidities: see above pertinent history  are also affecting patient's functional outcome.   REHAB POTENTIAL: Good  CLINICAL DECISION MAKING: Stable/uncomplicated  EVALUATION COMPLEXITY: Low   GOALS: Goals reviewed with patient? Yes  SHORT TERM GOALS: (target date for Short term goals are 3 weeks 04/10/23)  1. Patient will demonstrate independent use of home exercise program to maintain progress from in clinic treatments.  Goal status: New  LONG TERM GOALS:  (target dates for all long term goals are 10 weeks  05/29/23 )   1. Patient will demonstrate/report pain at worst less than or equal to 2/10 to facilitate minimal limitation in daily activity secondary to pain symptoms.  Goal status: New   2. Patient will demonstrate independent use of home exercise program to facilitate ability to maintain/progress functional gains from skilled physical therapy services.  Goal status: New   3. Patient will demonstrate FOTO outcome > or = 66 % to indicate reduced disability due to condition.  Goal status: New   4. Patient will demonstrate lumbar extension 100 % WFL s symptoms to facilitate upright standing, walking posture at PLOF s limitation.  Goal status: New   5.  Pt will be able to navigate 1 flight of stairs with no rail with reciprocal gait pattern.  Goal status: New   6.  Pt will be able to pick 20# from floor  and place it at counter height with no pain using correct body mechanics.  Goal status: New     PLAN:  PT FREQUENCY: 1-2x/week  PT DURATION: 12 weeks  PLANNED INTERVENTIONS: Therapeutic exercises, Therapeutic activity, Neuro Muscular re-education, Balance training, Gait training, Patient/Family education, Joint mobilization, Stair training, DME instructions, Dry Needling, Electrical stimulation, Cryotherapy, vasopneumatic device, Moist heat, Taping, Traction Ultrasound, Ionotophoresis 4mg /ml Dexamethasone, and aquatic therapy, Manual therapy.  All included unless contraindicated  PLAN FOR NEXT SESSION: Review HEP knowledge/results, manual/mobs, core strengthening, consider DN     Sharmon Leyden, PT, MPT 03/16/2023, 1:56 PM  Referring diagnosis?  M54.16 (ICD-10-CM) - Lumbar radiculopathy M54.42,M54.41,G89.29 (ICD-10-CM) - Chronic bilateral low back pain with bilateral sciatica Treatment diagnosis? (if different than referring diagnosis) M54.16, M54.42, M62.81, R26.2 What was this (referring dx) caused by? []  Surgery []   Fall [x]  Ongoing issue []  Arthritis []  Other: ____________  Laterality: []  Rt []  Lt [x]  Both  Check all possible CPT codes:  *CHOOSE 10 OR LESS*    [x]  97110 (Therapeutic Exercise)  []  92507 (SLP Treatment)  [x]  97112 (Neuro Re-ed)   []  92526 (Swallowing Treatment)   [x]  97116 (Gait Training)   []  [id number] (Cognitive Training, 1st 15 minutes) [x]  97140 (Manual Therapy)   []  97130 (Cognitive Training, each add'l 15 minutes)  []  97164 (Re-evaluation)                              []  Other, List CPT Code ____________  [x]  97530 (Therapeutic Activities)     [x]  97535 (Self Care)   []  All codes above (97110 - 97535)  [x]  97012 (Mechanical Traction)  [x]  97014 (  E-stim Unattended)  []  97032 (E-stim manual)  []  97033 (Ionto)  []  97035 (Ultrasound) []  97750 (Physical Performance Training) []  [id number] (Aquatic Therapy) []  40981 (Vasopneumatic Device) []  [id number] (Paraffin) []  97034 (Contrast Bath) []  [id number] (Wound Care 1st 20 sq cm) []  97598 (Wound Care each add'l 20 sq cm) []  97760 (Orthotic Fabrication, Fitting, Training Initial) []  [id number] (Prosthetic Management and Training Initial) []  [id number] (Orthotic or Prosthetic Training/ Modification Subsequent)

## 2023-03-23 ENCOUNTER — Encounter: Payer: Self-pay | Admitting: Physical Therapy

## 2023-03-23 ENCOUNTER — Ambulatory Visit: Payer: Medicare HMO | Admitting: Physical Therapy

## 2023-03-23 DIAGNOSIS — M5416 Radiculopathy, lumbar region: Secondary | ICD-10-CM

## 2023-03-23 DIAGNOSIS — M5441 Lumbago with sciatica, right side: Secondary | ICD-10-CM

## 2023-03-23 DIAGNOSIS — M5442 Lumbago with sciatica, left side: Secondary | ICD-10-CM | POA: Diagnosis not present

## 2023-03-23 DIAGNOSIS — G8929 Other chronic pain: Secondary | ICD-10-CM

## 2023-03-23 DIAGNOSIS — R262 Difficulty in walking, not elsewhere classified: Secondary | ICD-10-CM

## 2023-03-23 DIAGNOSIS — M6281 Muscle weakness (generalized): Secondary | ICD-10-CM | POA: Diagnosis not present

## 2023-03-23 NOTE — Therapy (Signed)
OUTPATIENT PHYSICAL THERAPY THORACOLUMBAR EVALUATION   Patient Name: Michael [name] MRN: [id number] DOB:November 26, 1955, 67 y.o., male Today's Date: 03/23/2023  END OF SESSION:  PT End of Session - 03/23/23 1227     Visit Number 2    Number of Visits 12    Date for PT Re-Evaluation 06/12/23    Authorization Type [employer]:    PT Start Time 1145    PT Stop Time 1230    PT Time Calculation (min) 45 min    Activity Tolerance Patient tolerated treatment well    Behavior During Therapy [facility] for tasks assessed/performed              Past Medical History:  Diagnosis Date   Anxiety    Childhood asthma    DVT (deep venous thrombosis) (HCC) 07/2015; 10/01/2015   RLE; RLE   GERD (gastroesophageal reflux disease)    Hepatitis B    HIV (human immunodeficiency virus infection) (HCC)    HTN (hypertension)    Hyperlipidemia    Pneumonia ~ 2000   Pulmonary embolism (HCC) 12/2013   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   Past Surgical History:  Procedure Laterality Date   ANTERIOR CERVICAL DECOMP/DISCECTOMY FUSION  2010   C40-C5   APPENDECTOMY     BACK SURGERY     COLONOSCOPY     SHOULDER ARTHROSCOPY W/ ROTATOR CUFF REPAIR Bilateral 2000's   "twice on right; once on the left"   SHOULDER ARTHROSCOPY WITH SUBACROMIAL DECOMPRESSION Right 12/15/2013   Procedure: RIGHT SHOULDER ARTHROSCOPY WITH EXTENSIVE DEBRIDEMENT OF ROTATOR CUFF,SUBACROMIAL DECOMPRESSION, DEBRIDMENT OF BURSA;  Surgeon: Loreta Ave, MD;  Location: [facility];  Service: Orthopedics;  Laterality: Right;   TONSILLECTOMY     Patient Active Problem List   Diagnosis Date Noted   Acute deep vein thrombosis (DVT) of right lower extremity (HCC) 10/01/2015   Sinusitis 10/01/2015   Hypokalemia 10/01/2015   DVT (deep venous thrombosis) (HCC) 10/01/2015   Encounter for therapeutic drug monitoring 08/03/2015   Essential hypertension 07/29/2015   Hyperlipidemia 07/29/2015   Pneumonia 02/01/2014   HIV disease  (HCC) 02/01/2014   Acute pulmonary embolism (HCC) 02/01/2014   Pulmonary embolism (HCC) 01/31/2014   Impingement syndrome of right shoulder 12/15/2013   Sacroiliac joint pain 04/12/2013   Pain in joint, lower leg 02/24/2013   Pain in left hip 02/24/2013   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 02/24/2013   Abnormal EKG 07/07/2011   Preop cardiovascular exam 07/07/2011   ABDOMINAL PAIN, EPIGASTRIC 03/09/2009   UNSPECIFIED LABYRINTHITIS 01/24/2009   FOOT PAIN, LEFT 11/03/2008   MORTON'S NEUROMA, 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   PLANTAR FASCIITIS, BILATERAL 09/26/2008   Hepatitis B virus infection 10/18/2007   VENEREAL WART 10/18/2007   LIVER HEMANGIOMA 10/18/2007   HYPERLIPIDEMIA 10/18/2007   DEPRESSION 10/18/2007   HAY FEVER 10/18/2007   PNEUMONIA 10/18/2007   ASTHMA, CHILDHOOD 10/18/2007   GASTROESOPHAGEAL REFLUX DISEASE 10/18/2007   Other acquired absence of organ 10/18/2007   ROTATOR CUFF REPAIR, RIGHT, HX OF 10/18/2007   HIV DISEASE 08/05/2007    PCP: Dois Davenport, MD   REFERRING PROVIDER: Juanda Chance, NP   REFERRING DIAG: M54.16 (ICD-10-CM) - Lumbar radiculopathy M54.42,M54.41,G89.29 (ICD-10-CM) - Chronic bilateral low back pain with bilateral sciatica  Rationale for Evaluation and Treatment: Rehabilitation  THERAPY DIAG:  Lumbar radiculopathy  Chronic bilateral low back pain with bilateral sciatica  Muscle weakness (generalized)  Difficulty in walking, not elsewhere classified  ONSET DATE: a bout a year ago  SUBJECTIVE:                                                                                                                                                                                           SUBJECTIVE STATEMENT: Pt arriving today reporting 6/10 pain in his low back. Pt stating that the hamstring stretch using a strap is a little much.   PERTINENT HISTORY:  pulmonary embolisms, anxiety, DVT, asthma, HTN, pneumonia, GERD, HIV  PAIN:  NPRS  scale:6/10 Pain location: bilateral hips and knees expanding from low back Pain description: achy, burning, can be stabbing Aggravating factors: sitting Relieving factors: walking  PRECAUTIONS for DN: blood thinner: Eliquis, HIV   WEIGHT BEARING RESTRICTIONS: No  FALLS:  Has patient fallen in last 6 months? No  LIVING ENVIRONMENT: Lives with: lives with their spouse Lives in: House/apartment Stairs: Yes: Internal: 1 flight  steps; on left going up Has following equipment at home: None  OCCUPATION: retired, Scientist, research (physical sciences)  PLOF: Independent  PATIENT GOALS: stop hurting, get back to working out without pain  Next MD Visit: none made a present time   OBJECTIVE:   DIAGNOSTIC FINDINGS:  FINDINGS: There is no evidence of lumbar spine fracture. Alignment is normal. Disc spaces are maintained. Degenerative endplate osteophytes are again seen at L4-L5 and L5-S1 similar to prior examination. There has been progression of degenerative facet change at L5-S1. Soft tissues demonstrate atherosclerotic calcifications in the aorta.   IMPRESSION: 1. Progressive degenerative changes of facet joints at L5-S1. 2. No evidence for fracture or malalignment.  PATIENT SURVEYS:  03/16/23: FOTO eval:     56%   SCREENING FOR RED FLAGS: Bowel or bladder incontinence: No Cauda equina syndrome: No  COGNITION: Overall cognitive status: WFL normal      SENSATION: WFL  MUSCLE LENGTH: 03/16/23 Hamstrings: Right 70 deg; Left 68 deg   POSTURE:  rounded shoulders and forward head  PALPATION: 03/16/23: TTP: lumbar paraspinals  LUMBAR ROM:   AROM Eval 03/16/23  Flexion 30 c pain  Extension 5 c pain  Right lateral flexion   Left lateral flexion   Right rotation Limited 50% due to pain  Left rotation Limited 50% due to pain   (Blank rows = not tested)  LOWER EXTREMITY MMT:     MMT  Right 03/16/23 Left 03/16/23  Hip flexion 5 4  Hip extension    Hip abduction 5 4  Hip  adduction 5 4  Hip internal rotation    Hip external rotation    Knee flexion 5 4  Knee extension 5 5  Ankle dorsiflexion    Ankle plantarflexion  Ankle inversion    Ankle eversion     (Blank rows = not tested)  LOWER EXTREMITY ROM:    ROM Right 03/16/23 Left 03/16/23  Hip flexion 95 c pain in groin 90 c tightness in groin  Hip extension    Hip abduction    Hip adduction    Hip internal rotation    Hip external rotation    Knee flexion    Knee extension    Ankle dorsiflexion    Ankle plantarflexion    Ankle inversion    Ankle eversion     (Blank rows = not tested)  LUMBAR SPECIAL TESTS:       03/16/23: Straight leg raise test: Positive  FUNCTIONAL TESTS:        03/16/23:  5 times sit to stand: 10 seconds no UE support  GAIT: Distance walked: 30 feet level surface Assistive device utilized: None Level of assistance: Complete Independence Comments: WFL                                                                                                                                                                                                                  TODAY'S TREATMENT:                                                                                                         DATE:  03/23/23:  TherEx:  Nustep: level 5 x 8 minutes UE/LE  Supine: trunk rotation in figure 4 position x 3 bil holding 20 sec Supine SLR: x 15 bil LE Piriformis stretch: pushing knee away x 2 holding 20 sec bil LEs Piriformis stretch: pulling knee toward opposite side x 2 bil LEs holding 20 sec Bridge: x 10 holding 5 sec Seated hamstring stretch: bil LEs x 20 seconds Manual:  Skilled palpation of active trigger points during TPDN STM to Rt QL and lumbar paraspinals Trigger Point Dry-Needling  Treatment instructions: Expect mild to moderate muscle soreness. S/S of pneumothorax if dry needled over a lung field, and to seek immediate medical attention should they occur. Patient verbalized  understanding of these instructions and education.  Patient Consent Given: Yes Education handout provided: Previously provided Muscles treated: lumbar multifidi bil Electrical stimulation performed: No Parameters: N/A Treatment response/outcome: twitch response noted, pt reporting less tightness at end of session Pt instructed in self soft tissue massage using tennis      03/16/23:   Therex:   HEP instruction/performance c cues for techniques, handout provided.  Trial set performed of each for comprehension and symptom assessment.  See below for exercise list  PATIENT EDUCATION:  Education details: HEP, POC Person educated: Patient Education method: Explanation, Demonstration, Verbal cues, and Handouts Education comprehension: verbalized understanding, returned demonstration, and verbal cues required  HOME EXERCISE PROGRAM: Access Code: [email or web address] URL: [email or web address] Date: 03/16/2023 Prepared by: Narda Amber  Exercises - Supine Lower Trunk Rotation  - 1-2 x daily - 7 x weekly - 3 reps - 20 seconds hold - Hooklying Single Knee to Chest Stretch  - 1-2 x daily - 7 x weekly - 3 reps - 20 seconds hold - Supine Figure 4 Piriformis Stretch  - 1-2 x daily - 7 x weekly - 3 reps - 20 seconds hold - Supine Bridge  - 1-2 x daily - 7 x weekly - 10 reps - 5 seconds it ban hold - Supine ITB Stretch with Strap  - 1-2 x daily - 7 x weekly - 3 reps - 20 seconds hold - Standing Hip Flexor Stretch  - 1-2 x daily - 7 x weekly - 3 reps - 20 seconds hold  ASSESSMENT:  CLINICAL IMPRESSION: Pt arriving reporting 6/10 pain today in his left side low back. Pt wishing to try DN with good response following reporting less tightness. Pt's HEP was altered to help with exercise comfort. Pt was edu in self soft tissue mobs using a tennis ball and we discussed beginning swimming progression. Continue skilled PT to maximize pt's function.   OBJECTIVE IMPAIRMENTS: decreased balance,  decreased mobility, difficulty walking, decreased ROM, decreased strength, impaired flexibility, and pain.   ACTIVITY LIMITATIONS: bending, sitting, standing, squatting, and sleeping  PARTICIPATION LIMITATIONS: community activity and occupation  PERSONAL FACTORS: 3+ comorbidities: see above pertinent history  are also affecting patient's functional outcome.   REHAB POTENTIAL: Good  CLINICAL DECISION MAKING: Stable/uncomplicated  EVALUATION COMPLEXITY: Low   GOALS: Goals reviewed with patient? Yes  SHORT TERM GOALS: (target date for Short term goals are 3 weeks 04/10/23)  1. Patient will demonstrate independent use of home exercise program to maintain progress from in clinic treatments.  Goal status: On-going 03/23/23  LONG TERM GOALS: (target dates for all long term goals are 10 weeks  05/29/23 )   1. Patient will demonstrate/report pain at worst less than or equal to 2/10 to facilitate minimal limitation in daily activity secondary to pain symptoms.  Goal status: New   2. Patient will demonstrate independent use of home exercise program to facilitate ability to maintain/progress functional gains from skilled physical therapy services.  Goal status: New   3. Patient will demonstrate FOTO outcome > or = 66 % to indicate reduced disability due to condition.  Goal status: New   4. Patient will demonstrate lumbar extension 100 % WFL s symptoms to facilitate upright standing, walking posture at PLOF s limitation.  Goal status: New   5.  Pt will be able to navigate 1 flight of stairs with no rail with reciprocal gait pattern.  Goal status: New   6.  Pt will be able to pick 20# from floor  and place it at counter height with no pain using correct body mechanics.  Goal status: New     PLAN:  PT FREQUENCY: 1-2x/week  PT DURATION: 12 weeks  PLANNED INTERVENTIONS: Therapeutic exercises, Therapeutic activity, Neuro Muscular re-education, Balance training, Gait training,  Patient/Family education, Joint mobilization, Stair training, DME instructions, Dry Needling, Electrical stimulation, Cryotherapy, vasopneumatic device, Moist heat, Taping, Traction Ultrasound, Ionotophoresis 4mg /ml Dexamethasone, and aquatic therapy, Manual therapy.  All included unless contraindicated  PLAN FOR NEXT SESSION: Review HEP knowledge/results, manual/mobs, core strengthening,  Assess response to DN      Sharmon Leyden, PT, MPT 03/23/2023, 12:51 PM  Referring diagnosis?  M54.16 (ICD-10-CM) - Lumbar radiculopathy M54.42,M54.41,G89.29 (ICD-10-CM) - Chronic bilateral low back pain with bilateral sciatica Treatment diagnosis? (if different than referring diagnosis) M54.16, M54.42, M62.81, R26.2 What was this (referring dx) caused by? []  Surgery []  Fall [x]  Ongoing issue []  Arthritis []  Other: ____________  Laterality: []  Rt []  Lt [x]  Both  Check all possible CPT codes:  *CHOOSE 10 OR LESS*    [x]  97110 (Therapeutic Exercise)  []  92507 (SLP Treatment)  [x]  11914 (Neuro Re-ed)   []  92526 (Swallowing Treatment)   [x]  97116 (Gait Training)   []  [id number] (Cognitive Training, 1st 15 minutes) [x]  97140 (Manual Therapy)   []  97130 (Cognitive Training, each add'l 15 minutes)  []  97164 (Re-evaluation)                              []  Other, List CPT Code ____________  [x]  97530 (Therapeutic Activities)     [x]  97535 (Self Care)   []  All codes above (97110 - 97535)  [x]  97012 (Mechanical Traction)  [x]  97014 (E-stim Unattended)  []  97032 (E-stim manual)  []  97033 (Ionto)  []  97035 (Ultrasound) []  97750 (Physical Performance Training) []  [id number] (Aquatic Therapy) []  97016 (Vasopneumatic Device) []  [id number] (Paraffin) []  97034 (Contrast Bath) []  97597 (Wound Care 1st 20 sq cm) []  97598 (Wound Care each add'l 20 sq cm) []  97760 (Orthotic Fabrication, Fitting, Training Initial) []  [id number] (Prosthetic Management and Training Initial) []  [id number] (Orthotic or Prosthetic Training/  Modification Subsequent)

## 2023-03-30 ENCOUNTER — Encounter: Payer: Self-pay | Admitting: Physical Therapy

## 2023-03-30 ENCOUNTER — Ambulatory Visit (INDEPENDENT_AMBULATORY_CARE_PROVIDER_SITE_OTHER): Payer: Medicare HMO | Admitting: Physical Therapy

## 2023-03-30 DIAGNOSIS — M6281 Muscle weakness (generalized): Secondary | ICD-10-CM | POA: Diagnosis not present

## 2023-03-30 DIAGNOSIS — R262 Difficulty in walking, not elsewhere classified: Secondary | ICD-10-CM

## 2023-03-30 DIAGNOSIS — M5416 Radiculopathy, lumbar region: Secondary | ICD-10-CM | POA: Diagnosis not present

## 2023-03-30 DIAGNOSIS — M5442 Lumbago with sciatica, left side: Secondary | ICD-10-CM | POA: Diagnosis not present

## 2023-03-30 DIAGNOSIS — G8929 Other chronic pain: Secondary | ICD-10-CM

## 2023-03-30 DIAGNOSIS — M5441 Lumbago with sciatica, right side: Secondary | ICD-10-CM

## 2023-03-30 NOTE — Therapy (Signed)
OUTPATIENT PHYSICAL THERAPY THORACOLUMBAR    Patient Name: Michael [name] MRN: [id number] DOB:23-Jul-1956, 67 y.o., male Today's Date: 03/30/2023  END OF SESSION:  PT End of Session - 03/30/23 1236     Visit Number 3    Number of Visits 12    Date for PT Re-Evaluation 06/12/23    Authorization Type [employer]:    PT Start Time 1150    PT Stop Time 1230    PT Time Calculation (min) 40 min    Activity Tolerance Patient tolerated treatment well    Behavior During Therapy [facility] for tasks assessed/performed              Past Medical History:  Diagnosis Date   Anxiety    Childhood asthma    DVT (deep venous thrombosis) (HCC) 07/2015; 10/01/2015   RLE; RLE   GERD (gastroesophageal reflux disease)    Hepatitis B    HIV (human immunodeficiency virus infection) (HCC)    HTN (hypertension)    Hyperlipidemia    Pneumonia ~ 2000   Pulmonary embolism (HCC) 12/2013   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   Past Surgical History:  Procedure Laterality Date   ANTERIOR CERVICAL DECOMP/DISCECTOMY FUSION  2010   C40-C5   APPENDECTOMY     BACK SURGERY     COLONOSCOPY     SHOULDER ARTHROSCOPY W/ ROTATOR CUFF REPAIR Bilateral 2000's   "twice on right; once on the left"   SHOULDER ARTHROSCOPY WITH SUBACROMIAL DECOMPRESSION Right 12/15/2013   Procedure: RIGHT SHOULDER ARTHROSCOPY WITH EXTENSIVE DEBRIDEMENT OF ROTATOR CUFF,SUBACROMIAL DECOMPRESSION, DEBRIDMENT OF BURSA;  Surgeon: Loreta Ave, MD;  Location: [facility];  Service: Orthopedics;  Laterality: Right;   TONSILLECTOMY     Patient Active Problem List   Diagnosis Date Noted   Acute deep vein thrombosis (DVT) of right lower extremity (HCC) 10/01/2015   Sinusitis 10/01/2015   Hypokalemia 10/01/2015   DVT (deep venous thrombosis) (HCC) 10/01/2015   Encounter for therapeutic drug monitoring 08/03/2015   Essential hypertension 07/29/2015   Hyperlipidemia 07/29/2015   Pneumonia 02/01/2014   HIV disease (HCC)  02/01/2014   Acute pulmonary embolism (HCC) 02/01/2014   Pulmonary embolism (HCC) 01/31/2014   Impingement syndrome of right shoulder 12/15/2013   Sacroiliac joint pain 04/12/2013   Pain in joint, lower leg 02/24/2013   Pain in left hip 02/24/2013   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 02/24/2013   Abnormal EKG 07/07/2011   Preop cardiovascular exam 07/07/2011   ABDOMINAL PAIN, EPIGASTRIC 03/09/2009   UNSPECIFIED LABYRINTHITIS 01/24/2009   FOOT PAIN, LEFT 11/03/2008   MORTON'S NEUROMA, 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   PLANTAR FASCIITIS, BILATERAL 09/26/2008   Hepatitis B virus infection 10/18/2007   VENEREAL WART 10/18/2007   LIVER HEMANGIOMA 10/18/2007   HYPERLIPIDEMIA 10/18/2007   DEPRESSION 10/18/2007   HAY FEVER 10/18/2007   PNEUMONIA 10/18/2007   ASTHMA, CHILDHOOD 10/18/2007   GASTROESOPHAGEAL REFLUX DISEASE 10/18/2007   Other acquired absence of organ 10/18/2007   ROTATOR CUFF REPAIR, RIGHT, HX OF 10/18/2007   HIV DISEASE 08/05/2007    PCP: Dois Davenport, MD   REFERRING PROVIDER: Juanda Chance, NP   REFERRING DIAG: M54.16 (ICD-10-CM) - Lumbar radiculopathy M54.42,M54.41,G89.29 (ICD-10-CM) - Chronic bilateral low back pain with bilateral sciatica  Rationale for Evaluation and Treatment: Rehabilitation  THERAPY DIAG:  Lumbar radiculopathy  Chronic bilateral low back pain with bilateral sciatica  Muscle weakness (generalized)  Difficulty in walking, not elsewhere classified  ONSET DATE: about a year ago  SUBJECTIVE:  SUBJECTIVE STATEMENT: Pt arriving to therapy today reporting 4/10 pain in his left side low back. Pt reporting good response to DN at his last visit. Pt stating he feels better overall and stated he is thinking about joining the [employer] for swimming.   PERTINENT HISTORY:   pulmonary embolisms, anxiety, DVT, asthma, HTN, pneumonia, GERD, HIV  PAIN:  NPRS scale:4/10 Pain location: bilateral hips and knees expanding from low back Pain description: achy, burning, can be stabbing Aggravating factors: sitting Relieving factors: walking  PRECAUTIONS for DN: blood thinner: Eliquis, HIV   WEIGHT BEARING RESTRICTIONS: No  FALLS:  Has patient fallen in last 6 months? No  LIVING ENVIRONMENT: Lives with: lives with their spouse Lives in: House/apartment Stairs: Yes: Internal: 1 flight  steps; on left going up Has following equipment at home: None  OCCUPATION: retired, Scientist, research (physical sciences)  PLOF: Independent  PATIENT GOALS: stop hurting, get back to working out without pain  Next MD Visit: none made a present time   OBJECTIVE:   DIAGNOSTIC FINDINGS:  FINDINGS: There is no evidence of lumbar spine fracture. Alignment is normal. Disc spaces are maintained. Degenerative endplate osteophytes are again seen at L4-L5 and L5-S1 similar to prior examination. There has been progression of degenerative facet change at L5-S1. Soft tissues demonstrate atherosclerotic calcifications in the aorta.   IMPRESSION: 1. Progressive degenerative changes of facet joints at L5-S1. 2. No evidence for fracture or malalignment.  PATIENT SURVEYS:  03/16/23: FOTO eval:     56%   SCREENING FOR RED FLAGS: Bowel or bladder incontinence: No Cauda equina syndrome: No  COGNITION: Overall cognitive status: WFL normal      SENSATION: WFL  MUSCLE LENGTH: 03/16/23 Hamstrings: Right 70 deg; Left 68 deg   POSTURE:  rounded shoulders and forward head  PALPATION: 03/16/23: TTP: lumbar paraspinals  LUMBAR ROM:   AROM Eval 03/16/23  Flexion 30 c pain  Extension 5 c pain  Right lateral flexion   Left lateral flexion   Right rotation Limited 50% due to pain  Left rotation Limited 50% due to pain   (Blank rows = not tested)  LOWER EXTREMITY MMT:     MMT   Right 03/16/23 Left 03/16/23  Hip flexion 5 4  Hip extension    Hip abduction 5 4  Hip adduction 5 4  Hip internal rotation    Hip external rotation    Knee flexion 5 4  Knee extension 5 5  Ankle dorsiflexion    Ankle plantarflexion    Ankle inversion    Ankle eversion     (Blank rows = not tested)  LOWER EXTREMITY ROM:    ROM Right 03/16/23 Left 03/16/23  Hip flexion 95 c pain in groin 90 c tightness in groin  Hip extension    Hip abduction    Hip adduction    Hip internal rotation    Hip external rotation    Knee flexion    Knee extension    Ankle dorsiflexion    Ankle plantarflexion    Ankle inversion    Ankle eversion     (Blank rows = not tested)  LUMBAR SPECIAL TESTS:       03/16/23: Straight leg raise test: Positive  FUNCTIONAL TESTS:        03/16/23:  5 times sit to stand: 10 seconds no UE support  GAIT: Distance walked: 30 feet level surface Assistive device utilized: None Level of assistance: Complete Independence Comments: [facility]  TODAY'S TREATMENT:                                                                                                         DATE:  03/30/23:  TherEx:  Prone: hip extension: 2 x 10 bil LE Prone press ups x 4 holding 20 sec Supine: trunk rotation in figure 4 position x 3 bil holding 20 sec Piriformis stretch: pushing knee away x 2 holding 20 sec bil LEs Piriformis stretch: pulling knee toward opposite side x 2 bil LEs holding 20 sec Standing rows: x 20 c blue TB Standing trunk extension: elbows on wall x 5 holding 10 sec TRX squat stretch x 10  Manual:  Skilled palpation of active trigger points during TPDN STM to Rt QL and lumbar paraspinals Trigger Point Dry-Needling  Treatment instructions: Expect mild to moderate muscle  soreness. S/S of pneumothorax if dry needled over a lung field, and to seek immediate medical attention should they occur. Patient verbalized understanding of these instructions and education.  Patient Consent Given: Yes Education handout provided: Previously provided Muscles treated: lumbar multifidi on left, left piriformis. glutes Electrical stimulation performed: No Parameters: N/A Treatment response/outcome: twitch response noted, pt reporting less tightness      03/23/23:  TherEx:  Nustep: level 5 x 8 minutes UE/LE  Supine: trunk rotation in figure 4 position x 3 bil holding 20 sec Supine SLR: x 15 bil LE Piriformis stretch: pushing knee away x 2 holding 20 sec bil LEs Piriformis stretch: pulling knee toward opposite side x 2 bil LEs holding 20 sec Bridge: x 10 holding 5 sec Seated hamstring stretch: bil LEs x 20 seconds Manual:  Skilled palpation of active trigger points during TPDN STM to Rt QL and lumbar paraspinals Trigger Point Dry-Needling  Treatment instructions: Expect mild to moderate muscle soreness. S/S of pneumothorax if dry needled over a lung field, and to seek immediate medical attention should they occur. Patient verbalized understanding of these instructions and education.  Patient Consent Given: Yes Education handout provided: Previously provided Muscles treated: lumbar multifidi bil Electrical stimulation performed: No Parameters: N/A Treatment response/outcome: twitch response noted, pt reporting less tightness at end of session Pt instructed in self soft tissue massage using tennis  03/16/23:   Therex:   HEP instruction/performance c cues for techniques, handout provided.  Trial set performed of each for comprehension and symptom assessment.  See below for exercise list     PATIENT EDUCATION:  Education details: HEP, POC Person educated: Patient Education method: Explanation, Demonstration, Verbal cues, and Handouts Education comprehension:  verbalized understanding, returned demonstration, and verbal cues required  HOME EXERCISE PROGRAM: Access Code: [email or web address] URL: [email or web address] Date: 03/16/2023 Prepared by: Narda Amber  Exercises - Supine Lower Trunk Rotation  - 1-2 x daily - 7 x weekly - 3 reps - 20 seconds hold - Hooklying Single Knee to Chest Stretch  - 1-2 x daily - 7 x weekly - 3 reps - 20 seconds hold - Supine Figure 4 Piriformis Stretch  - 1-2 x daily - 7 x weekly - 3  reps - 20 seconds hold - Supine Bridge  - 1-2 x daily - 7 x weekly - 10 reps - 5 seconds it ban hold - Supine ITB Stretch with Strap  - 1-2 x daily - 7 x weekly - 3 reps - 20 seconds hold - Standing Hip Flexor Stretch  - 1-2 x daily - 7 x weekly - 3 reps - 20 seconds hold  ASSESSMENT:  CLINICAL IMPRESSION: Pt arriving today with 4/10 pain in his left side low back. Pt stating since DN last visit he has experienced less pain radiation down his left LE. Pt wishing to try DN again due to pin point tightness with palpation. Pt with good response and reported less tightness following session. Continue skilled PT to maximize pt's function.   OBJECTIVE IMPAIRMENTS: decreased balance, decreased mobility, difficulty walking, decreased ROM, decreased strength, impaired flexibility, and pain.   ACTIVITY LIMITATIONS: bending, sitting, standing, squatting, and sleeping  PARTICIPATION LIMITATIONS: community activity and occupation  PERSONAL FACTORS: 3+ comorbidities: see above pertinent history  are also affecting patient's functional outcome.   REHAB POTENTIAL: Good  CLINICAL DECISION MAKING: Stable/uncomplicated  EVALUATION COMPLEXITY: Low   GOALS: Goals reviewed with patient? Yes  SHORT TERM GOALS: (target date for Short term goals are 3 weeks 04/10/23)  1. Patient will demonstrate independent use of home exercise program to maintain progress from in clinic treatments.  Goal status: MET 03/30/23  LONG TERM GOALS: (target  dates for all long term goals are 10 weeks  05/29/23 )   1. Patient will demonstrate/report pain at worst less than or equal to 2/10 to facilitate minimal limitation in daily activity secondary to pain symptoms.  Goal status: New   2. Patient will demonstrate independent use of home exercise program to facilitate ability to maintain/progress functional gains from skilled physical therapy services.  Goal status: New   3. Patient will demonstrate FOTO outcome > or = 66 % to indicate reduced disability due to condition.  Goal status: New   4. Patient will demonstrate lumbar extension 100 % WFL s symptoms to facilitate upright standing, walking posture at PLOF s limitation.  Goal status: New   5.  Pt will be able to navigate 1 flight of stairs with no rail with reciprocal gait pattern.  Goal status: New   6.  Pt will be able to pick 20# from floor  and place it at counter height with no pain using correct body mechanics.  Goal status: New     PLAN:  PT FREQUENCY: 1-2x/week  PT DURATION: 12 weeks  PLANNED INTERVENTIONS: Therapeutic exercises, Therapeutic activity, Neuro Muscular re-education, Balance training, Gait training, Patient/Family education, Joint mobilization, Stair training, DME instructions, Dry Needling, Electrical stimulation, Cryotherapy, vasopneumatic device, Moist heat, Taping, Traction Ultrasound, Ionotophoresis 4mg /ml Dexamethasone, and aquatic therapy, Manual therapy.  All included unless contraindicated  PLAN FOR NEXT SESSION: Review HEP knowledge/results, manual/mobs, core strengthening,  Assess response to DN      Sharmon Leyden, PT, MPT 03/30/2023, 12:37 PM  Referring diagnosis?  M54.16 (ICD-10-CM) - Lumbar radiculopathy M54.42,M54.41,G89.29 (ICD-10-CM) - Chronic bilateral low back pain with bilateral sciatica Treatment diagnosis? (if different than referring diagnosis) M54.16, M54.42, M62.81, R26.2 What was this (referring dx) caused by? []   Surgery []  Fall [x]  Ongoing issue []  Arthritis []  Other: ____________  Laterality: []  Rt []  Lt [x]  Both  Check all possible CPT codes:  *CHOOSE 10 OR LESS*    [x]  97110 (Therapeutic Exercise)  []  92507 (SLP Treatment)  [x]  27253 (  Neuro Re-ed)   []  92526 (Swallowing Treatment)   [x]  97116 (Gait Training)   []  [phone] (Cognitive Training, 1st 15 minutes) [x]  97140 (Manual Therapy)   []  97130 (Cognitive Training, each add'l 15 minutes)  []  97164 (Re-evaluation)                              []  Other, List CPT Code ____________  [x]  97530 (Therapeutic Activities)     [x]  97535 (Self Care)   []  All codes above (97110 - 97535)  [x]  97012 (Mechanical Traction)  [x]  97014 (E-stim Unattended)  []  97032 (E-stim manual)  []  97033 (Ionto)  []  97035 (Ultrasound) []  97750 (Physical Performance Training) []  [id number] (Aquatic Therapy) []  97016 (Vasopneumatic Device) []  [id number] (Paraffin) []  97034 (Contrast Bath) []  97597 (Wound Care 1st 20 sq cm) []  56213 (Wound Care each add'l 20 sq cm) []  97760 (Orthotic Fabrication, Fitting, Training Initial) []  [id number] (Prosthetic Management and Training Initial) []  [phone] (Orthotic or Prosthetic Training/ Modification Subsequent)

## 2023-04-06 ENCOUNTER — Ambulatory Visit: Payer: Medicare HMO | Admitting: Physical Therapy

## 2023-04-06 ENCOUNTER — Encounter: Payer: Self-pay | Admitting: Physical Therapy

## 2023-04-06 DIAGNOSIS — R262 Difficulty in walking, not elsewhere classified: Secondary | ICD-10-CM | POA: Diagnosis not present

## 2023-04-06 DIAGNOSIS — M5442 Lumbago with sciatica, left side: Secondary | ICD-10-CM | POA: Diagnosis not present

## 2023-04-06 DIAGNOSIS — M5441 Lumbago with sciatica, right side: Secondary | ICD-10-CM

## 2023-04-06 DIAGNOSIS — M5416 Radiculopathy, lumbar region: Secondary | ICD-10-CM

## 2023-04-06 DIAGNOSIS — M6281 Muscle weakness (generalized): Secondary | ICD-10-CM

## 2023-04-06 DIAGNOSIS — G8929 Other chronic pain: Secondary | ICD-10-CM

## 2023-04-06 NOTE — Therapy (Signed)
OUTPATIENT PHYSICAL THERAPY THORACOLUMBAR    Patient Name: Michael [name] MRN: [id number] DOB:[birth year], 67 y.o., male Today's Date: 04/06/2023  END OF SESSION:  PT End of Session - 04/06/23 1143     Visit Number 4    Number of Visits 12    Date for PT Re-Evaluation 06/12/23    Authorization Type [employer]:    PT Start Time 1145    PT Stop Time 1225    PT Time Calculation (min) 40 min    Activity Tolerance Patient tolerated treatment well    Behavior During Therapy [facility] for tasks assessed/performed              Past Medical History:  Diagnosis Date   Anxiety    Childhood asthma    DVT (deep venous thrombosis) (HCC) 07/2015; 10/01/2015   RLE; RLE   GERD (gastroesophageal reflux disease)    Hepatitis B    HIV (human immunodeficiency virus infection) (HCC)    HTN (hypertension)    Hyperlipidemia    Pneumonia ~ 2000   Pulmonary embolism (HCC) 12/2013   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   Past Surgical History:  Procedure Laterality Date   ANTERIOR CERVICAL DECOMP/DISCECTOMY FUSION  2010   C40-C5   APPENDECTOMY     BACK SURGERY     COLONOSCOPY     SHOULDER ARTHROSCOPY W/ ROTATOR CUFF REPAIR Bilateral 2000's   "twice on right; once on the left"   SHOULDER ARTHROSCOPY WITH SUBACROMIAL DECOMPRESSION Right 12/15/2013   Procedure: RIGHT SHOULDER ARTHROSCOPY WITH EXTENSIVE DEBRIDEMENT OF ROTATOR CUFF,SUBACROMIAL DECOMPRESSION, DEBRIDMENT OF BURSA;  Surgeon: Loreta Ave, MD;  Location: [facility];  Service: Orthopedics;  Laterality: Right;   TONSILLECTOMY     Patient Active Problem List   Diagnosis Date Noted   Acute deep vein thrombosis (DVT) of right lower extremity (HCC) 10/01/2015   Sinusitis 10/01/2015   Hypokalemia 10/01/2015   DVT (deep venous thrombosis) (HCC) 10/01/2015   Encounter for therapeutic drug monitoring 08/03/2015   Essential hypertension 07/29/2015   Hyperlipidemia 07/29/2015   Pneumonia 02/01/2014   HIV disease (HCC)  02/01/2014   Acute pulmonary embolism (HCC) 02/01/2014   Pulmonary embolism (HCC) 01/31/2014   Impingement syndrome of right shoulder 12/15/2013   Sacroiliac joint pain 04/12/2013   Pain in joint, lower leg 02/24/2013   Pain in left hip 02/24/2013   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 02/24/2013   Abnormal EKG 07/07/2011   Preop cardiovascular exam 07/07/2011   ABDOMINAL PAIN, EPIGASTRIC 03/09/2009   UNSPECIFIED LABYRINTHITIS 01/24/2009   FOOT PAIN, LEFT 11/03/2008   MORTON'S NEUROMA, 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   PLANTAR FASCIITIS, BILATERAL 09/26/2008   Hepatitis B virus infection 10/18/2007   VENEREAL WART 10/18/2007   LIVER HEMANGIOMA 10/18/2007   HYPERLIPIDEMIA 10/18/2007   DEPRESSION 10/18/2007   HAY FEVER 10/18/2007   PNEUMONIA 10/18/2007   ASTHMA, CHILDHOOD 10/18/2007   GASTROESOPHAGEAL REFLUX DISEASE 10/18/2007   Other acquired absence of organ 10/18/2007   ROTATOR CUFF REPAIR, RIGHT, HX OF 10/18/2007   HIV DISEASE 08/05/2007    PCP: Dois Davenport, MD   REFERRING PROVIDER: Juanda Chance, NP   REFERRING DIAG: M54.16 (ICD-10-CM) - Lumbar radiculopathy M54.42,M54.41,G89.29 (ICD-10-CM) - Chronic bilateral low back pain with bilateral sciatica  Rationale for Evaluation and Treatment: Rehabilitation  THERAPY DIAG:  Lumbar radiculopathy  Chronic bilateral low back pain with bilateral sciatica  Muscle weakness (generalized)  Difficulty in walking, not elsewhere classified  ONSET DATE: about a year ago  SUBJECTIVE:  SUBJECTIVE STATEMENT:  Pt arriving to therapy reporting 4-5/10. Pt reporting pain is radiating down back of left LE today.   PERTINENT HISTORY:  pulmonary embolisms, anxiety, DVT, asthma, HTN, pneumonia, GERD, HIV  PAIN:  NPRS scale:4-5/10, but can increase with  certain activities.  Pain location: bilateral hips and knees expanding from low back, more down left LE at times Pain description: achy, burning, can be stabbing Aggravating factors: sitting Relieving factors: walking  PRECAUTIONS for DN: blood thinner: Eliquis, HIV   WEIGHT BEARING RESTRICTIONS: No  FALLS:  Has patient fallen in last 6 months? No  LIVING ENVIRONMENT: Lives with: lives with their spouse Lives in: House/apartment Stairs: Yes: Internal: 1 flight  steps; on left going up Has following equipment at home: None  OCCUPATION: retired, Scientist, research (physical sciences)  PLOF: Independent  PATIENT GOALS: stop hurting, get back to working out without pain  Next MD Visit: none made a present time   OBJECTIVE:   DIAGNOSTIC FINDINGS:  FINDINGS: There is no evidence of lumbar spine fracture. Alignment is normal. Disc spaces are maintained. Degenerative endplate osteophytes are again seen at L4-L5 and L5-S1 similar to prior examination. There has been progression of degenerative facet change at L5-S1. Soft tissues demonstrate atherosclerotic calcifications in the aorta.   IMPRESSION: 1. Progressive degenerative changes of facet joints at L5-S1. 2. No evidence for fracture or malalignment.  PATIENT SURVEYS:  03/16/23: FOTO eval:     56%   SCREENING FOR RED FLAGS: Bowel or bladder incontinence: No Cauda equina syndrome: No  COGNITION: Overall cognitive status: WFL normal      SENSATION: WFL  MUSCLE LENGTH: 03/16/23 Hamstrings: Right 70 deg; Left 68 deg   POSTURE:  rounded shoulders and forward head  PALPATION: 03/16/23: TTP: lumbar paraspinals  LUMBAR ROM:   AROM Eval 03/16/23  Flexion 30 c pain  Extension 5 c pain  Right lateral flexion   Left lateral flexion   Right rotation Limited 50% due to pain  Left rotation Limited 50% due to pain   (Blank rows = not tested)  LOWER EXTREMITY MMT:     MMT  Right 03/16/23 Left 03/16/23  Hip flexion 5 4  Hip  extension    Hip abduction 5 4  Hip adduction 5 4  Hip internal rotation    Hip external rotation    Knee flexion 5 4  Knee extension 5 5  Ankle dorsiflexion    Ankle plantarflexion    Ankle inversion    Ankle eversion     (Blank rows = not tested)  LOWER EXTREMITY ROM:    ROM Right 03/16/23 Left 03/16/23  Hip flexion 95 c pain in groin 90 c tightness in groin  Hip extension    Hip abduction    Hip adduction    Hip internal rotation    Hip external rotation    Knee flexion    Knee extension    Ankle dorsiflexion    Ankle plantarflexion    Ankle inversion    Ankle eversion     (Blank rows = not tested)  LUMBAR SPECIAL TESTS:       03/16/23: Straight leg raise test: Positive  FUNCTIONAL TESTS:        03/16/23:  5 times sit to stand: 10 seconds no UE support  GAIT: Distance walked: 30 feet level surface Assistive device utilized: None Level of assistance: Complete Independence Comments: [facility]  TODAY'S TREATMENT:                                                                                                         DATE:  04/06/23 TherEx:  Recumbent bike: x 8 minutes Verbally discussed alternative techniques to HEP stretches Traction:  Mechanical traction: Lumbar protocol x 25 minutes: max pull: 90#, min pull 75#     03/30/23:  TherEx:  Prone: hip extension: 2 x 10 bil LE Prone press ups x 4 holding 20 sec Supine: trunk rotation in figure 4 position x 3 bil holding 20 sec Piriformis stretch: pushing knee away x 2 holding 20 sec bil LEs Piriformis stretch: pulling knee toward opposite side x 2 bil LEs holding 20 sec Standing rows: x 20 c blue TB Standing trunk extension: elbows on wall x 5 holding 10 sec TRX squat stretch x 10  Manual:  Skilled palpation of  active trigger points during TPDN STM to Rt QL and lumbar paraspinals Trigger Point Dry-Needling  Treatment instructions: Expect mild to moderate muscle soreness. S/S of pneumothorax if dry needled over a lung field, and to seek immediate medical attention should they occur. Patient verbalized understanding of these instructions and education.  Patient Consent Given: Yes Education handout provided: Previously provided Muscles treated: lumbar multifidi on left, left piriformis. glutes Electrical stimulation performed: No Parameters: N/A Treatment response/outcome: twitch response noted, pt reporting less tightness      03/23/23:  TherEx:  Nustep: level 5 x 8 minutes UE/LE  Supine: trunk rotation in figure 4 position x 3 bil holding 20 sec Supine SLR: x 15 bil LE Piriformis stretch: pushing knee away x 2 holding 20 sec bil LEs Piriformis stretch: pulling knee toward opposite side x 2 bil LEs holding 20 sec Bridge: x 10 holding 5 sec Seated hamstring stretch: bil LEs x 20 seconds Manual:  Skilled palpation of active trigger points during TPDN STM to Rt QL and lumbar paraspinals Trigger Point Dry-Needling  Treatment instructions: Expect mild to moderate muscle soreness. S/S of pneumothorax if dry needled over a lung field, and to seek immediate medical attention should they occur. Patient verbalized understanding of these instructions and education.  Patient Consent Given: Yes Education handout provided: Previously provided Muscles treated: lumbar multifidi bil Electrical stimulation performed: No Parameters: N/A Treatment response/outcome: twitch response noted, pt reporting less tightness at end of session Pt instructed in self soft tissue massage using tennis  03/16/23:   Therex:   HEP instruction/performance c cues for techniques, handout provided.  Trial set performed of each for comprehension and symptom assessment.  See below for exercise list     PATIENT EDUCATION:   Education details: HEP, POC Person educated: Patient Education method: Explanation, Demonstration, Verbal cues, and Handouts Education comprehension: verbalized understanding, returned demonstration, and verbal cues required  HOME EXERCISE PROGRAM: Access Code: [email or web address] URL: [email or web address] Date: 03/16/2023 Prepared by: Narda Amber  Exercises - Supine Lower Trunk Rotation  - 1-2 x daily - 7 x weekly - 3 reps - 20 seconds hold - Hooklying Single Knee to Chest  Stretch  - 1-2 x daily - 7 x weekly - 3 reps - 20 seconds hold - Supine Figure 4 Piriformis Stretch  - 1-2 x daily - 7 x weekly - 3 reps - 20 seconds hold - Supine Bridge  - 1-2 x daily - 7 x weekly - 10 reps - 5 seconds it ban hold - Supine ITB Stretch with Strap  - 1-2 x daily - 7 x weekly - 3 reps - 20 seconds hold - Standing Hip Flexor Stretch  - 1-2 x daily - 7 x weekly - 3 reps - 20 seconds hold  ASSESSMENT:  CLINICAL IMPRESSION: Pt arriving today with 4-5/10 pain in his left side low back. Pt stating he couldn't tell as much of a difference following the DN his last visit. Mechanical traction trail performed today. Pt reporting feeling better at end of session. Continue skilled PT interventions to maximize pt's function.    OBJECTIVE IMPAIRMENTS: decreased balance, decreased mobility, difficulty walking, decreased ROM, decreased strength, impaired flexibility, and pain.   ACTIVITY LIMITATIONS: bending, sitting, standing, squatting, and sleeping  PARTICIPATION LIMITATIONS: community activity and occupation  PERSONAL FACTORS: 3+ comorbidities: see above pertinent history  are also affecting patient's functional outcome.   REHAB POTENTIAL: Good  CLINICAL DECISION MAKING: Stable/uncomplicated  EVALUATION COMPLEXITY: Low   GOALS: Goals reviewed with patient? Yes  SHORT TERM GOALS: (target date for Short term goals are 3 weeks 04/10/23)  1. Patient will demonstrate independent use of home  exercise program to maintain progress from in clinic treatments.  Goal status: MET 03/30/23  LONG TERM GOALS: (target dates for all long term goals are 10 weeks  05/29/23 )   1. Patient will demonstrate/report pain at worst less than or equal to 2/10 to facilitate minimal limitation in daily activity secondary to pain symptoms.  Goal status: On-going 04/06/23   2. Patient will demonstrate independent use of home exercise program to facilitate ability to maintain/progress functional gains from skilled physical therapy services.  Goal status: On-going 04/06/23   3. Patient will demonstrate FOTO outcome > or = 66 % to indicate reduced disability due to condition.  Goal status: New   4. Patient will demonstrate lumbar extension 100 % WFL s symptoms to facilitate upright standing, walking posture at PLOF s limitation.  Goal status: New   5.  Pt will be able to navigate 1 flight of stairs with no rail with reciprocal gait pattern.  Goal status: New   6.  Pt will be able to pick 20# from floor  and place it at counter height with no pain using correct body mechanics.  Goal status: New     PLAN:  PT FREQUENCY: 1-2x/week  PT DURATION: 12 weeks  PLANNED INTERVENTIONS: Therapeutic exercises, Therapeutic activity, Neuro Muscular re-education, Balance training, Gait training, Patient/Family education, Joint mobilization, Stair training, DME instructions, Dry Needling, Electrical stimulation, Cryotherapy, vasopneumatic device, Moist heat, Taping, Traction Ultrasound, Ionotophoresis 4mg /ml Dexamethasone, and aquatic therapy, Manual therapy.  All included unless contraindicated  PLAN FOR NEXT SESSION:  manual/mobs, core strengthening,      Sharmon Leyden, PT, MPT 04/06/2023, 1:00 PM  Referring diagnosis?  M54.16 (ICD-10-CM) - Lumbar radiculopathy M54.42,M54.41,G89.29 (ICD-10-CM) - Chronic bilateral low back pain with bilateral sciatica Treatment diagnosis? (if different than referring  diagnosis) M54.16, M54.42, M62.81, R26.2 What was this (referring dx) caused by? []  Surgery []  Fall [x]  Ongoing issue []  Arthritis []  Other: ____________  Laterality: []  Rt []  Lt [x]  Both  Check all possible CPT  codes:  *CHOOSE 10 OR LESS*    [x]  97110 (Therapeutic Exercise)  []  92507 (SLP Treatment)  [x]  [id number] (Neuro Re-ed)   []  92526 (Swallowing Treatment)   [x]  97116 (Gait Training)   []  [id number] (Cognitive Training, 1st 15 minutes) [x]  97140 (Manual Therapy)   []  97130 (Cognitive Training, each add'l 15 minutes)  []  97164 (Re-evaluation)                              []  Other, List CPT Code ____________  [x]  97530 (Therapeutic Activities)     [x]  97535 (Self Care)   []  All codes above (97110 - 97535)  [x]  [address] (Mechanical Traction)  [x]  97014 (E-stim Unattended)  []  97032 (E-stim manual)  []  97033 (Ionto)  []  97035 (Ultrasound) []  97750 (Physical Performance Training) []  [id number] (Aquatic Therapy) []  97016 (Vasopneumatic Device) []  [id number] (Paraffin) []  97034 (Contrast Bath) []  97597 (Wound Care 1st 20 sq cm) []  97598 (Wound Care each add'l 20 sq cm) []  97760 (Orthotic Fabrication, Fitting, Training Initial) []  [id number] (Prosthetic Management and Training Initial) []  [id number] (Orthotic or Prosthetic Training/ Modification Subsequent)

## 2023-04-13 ENCOUNTER — Inpatient Hospital Stay: Payer: Medicare HMO | Attending: Oncology | Admitting: Oncology

## 2023-04-13 ENCOUNTER — Inpatient Hospital Stay: Payer: Medicare HMO

## 2023-04-13 VITALS — BP 118/66 | HR 79 | Temp 98.1°F | Resp 20 | Ht 66.0 in | Wt 187.7 lb

## 2023-04-13 DIAGNOSIS — D696 Thrombocytopenia, unspecified: Secondary | ICD-10-CM | POA: Insufficient documentation

## 2023-04-13 DIAGNOSIS — Z86718 Personal history of other venous thrombosis and embolism: Secondary | ICD-10-CM | POA: Diagnosis present

## 2023-04-13 DIAGNOSIS — I10 Essential (primary) hypertension: Secondary | ICD-10-CM | POA: Insufficient documentation

## 2023-04-13 DIAGNOSIS — Z7901 Long term (current) use of anticoagulants: Secondary | ICD-10-CM | POA: Insufficient documentation

## 2023-04-13 DIAGNOSIS — D6859 Other primary thrombophilia: Secondary | ICD-10-CM | POA: Insufficient documentation

## 2023-04-13 DIAGNOSIS — Z86711 Personal history of pulmonary embolism: Secondary | ICD-10-CM | POA: Insufficient documentation

## 2023-04-13 DIAGNOSIS — N400 Enlarged prostate without lower urinary tract symptoms: Secondary | ICD-10-CM | POA: Insufficient documentation

## 2023-04-13 DIAGNOSIS — I2699 Other pulmonary embolism without acute cor pulmonale: Secondary | ICD-10-CM | POA: Diagnosis not present

## 2023-04-13 NOTE — Progress Notes (Deleted)
  [facility] New Patient Consult   Requesting MD: Kathleene Hazel, Md [address] [place],  [place] [address]   Michael [name] 67 y.o.  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    Past Surgical History:  Procedure Laterality Date   ANTERIOR CERVICAL DECOMP/DISCECTOMY FUSION  2010   C40-C5   APPENDECTOMY     BACK SURGERY     COLONOSCOPY     SHOULDER ARTHROSCOPY W/ ROTATOR CUFF REPAIR Bilateral 2000's   "twice on right; once on the left"   SHOULDER ARTHROSCOPY WITH SUBACROMIAL DECOMPRESSION Right 12/15/2013   Procedure: RIGHT SHOULDER ARTHROSCOPY WITH EXTENSIVE DEBRIDEMENT OF ROTATOR CUFF,SUBACROMIAL DECOMPRESSION, DEBRIDMENT OF BURSA;  Surgeon: Loreta Ave, MD;  Location: [facility];  Service: Orthopedics;  Laterality: Right;   TONSILLECTOMY      Medications: Reviewed  Allergies:  Allergies  Allergen Reactions   Efavirenz Rash    Family history: ***  Social History:   ***  ROS:   Positives include:  A complete ROS was otherwise negative.  Physical Exam:  Blood pressure 118/66, pulse 79, temperature 98.1 F (36.7 C), temperature source Oral, resp. rate 20, height 5\' 6"  (1.676 m), weight 187 lb 11.2 oz (85.1 kg), SpO2 98 %.  HEENT: *** Lungs: *** Cardiac: *** Abdomen: *** GU: ***  Vascular: *** Lymph nodes: *** Neurologic: *** Skin: *** Musculoskeletal: ***   LAB:  CBC  Lab Results  Component Value Date   WBC 6.8 11/24/2017   HGB 16.1 11/24/2017   HCT 46.2 11/24/2017   MCV 98 (H) 11/24/2017    PLT 134 (L) 11/24/2017   NEUTROABS 4.0 11/24/2017        CMP  Lab Results  Component Value Date   NA 139 11/03/2016   K 3.3 (L) 11/03/2016   CL 101 11/03/2016   CO2 25 11/03/2016   GLUCOSE 89 11/03/2016   BUN 16 11/03/2016   CREATININE 1.30 (H) 11/03/2016   CALCIUM 9.8 11/03/2016   PROT 6.7 08/21/2021   ALBUMIN 4.6 08/21/2021   AST 35 08/21/2021   ALT 43 08/21/2021   ALKPHOS 54 08/21/2021   BILITOT 0.5 08/21/2021   GFRNONAA 57 (L) 11/03/2016   GFRAA >60 11/03/2016     No results found for: "CEA1", "CAN199", "CA125"  Imaging:  No results found.    Assessment/Plan:   *** ***    Disposition:   ***  Thornton Papas, MD  04/13/2023, 10:33 AM

## 2023-04-13 NOTE — Progress Notes (Signed)
  [facility] OFFICE PROGRESS NOTE   Diagnosis: DVT/pulmonary embolism, chronic anticoagulation  INTERVAL HISTORY:   Mr. Michael [name] has a remote history of recurrent venous thromboembolic disease.  He is maintained on chronic anticoagulation therapy.  He was last seen in the hematology clinic in 2020.  He has been followed at [facility] for treatment of HIV, BPH, and anticoagulation.  He denies bleeding and symptoms of recurrent venous thromboembolic disease.  He is here today to reestablish care in our hematology clinic. Anticoagulation was changed to apixaban beginning in January 2021.  His HIV medications were changed to avoid interactions with apixaban. He is followed by urology at [facility] for prostatic hypertrophy.  He reports improvement in his symptoms with medical therapy.    Objective:  Vital signs in last 24 hours:  Blood pressure 118/66, pulse 79, temperature 98.1 F (36.7 C), temperature source Oral, resp. rate 20, height 5\' 6"  (1.676 m), weight 187 lb 11.2 oz (85.1 kg), SpO2 98 %.   Lymphatics: No cervical, supraclavicular, or axillary nodes Resp: Lungs clear bilaterally Cardio: Regular rate and rhythm GI: No hepatosplenomegaly Vascular: No leg edema  Lab Results:  Lab Results  Component Value Date   WBC 6.8 11/24/2017   HGB 16.1 11/24/2017   HCT 46.2 11/24/2017   MCV 98 (H) 11/24/2017   PLT 134 (L) 11/24/2017   NEUTROABS 4.0 11/24/2017    CMP  Lab Results  Component Value Date   NA 139 11/03/2016   K 3.3 (L) 11/03/2016   CL 101 11/03/2016   CO2 25 11/03/2016   GLUCOSE 89 11/03/2016   BUN 16 11/03/2016   CREATININE 1.30 (H) 11/03/2016   CALCIUM 9.8 11/03/2016   PROT 6.7 08/21/2021   ALBUMIN 4.6 08/21/2021   AST 35 08/21/2021   ALT 43 08/21/2021   ALKPHOS 54 08/21/2021   BILITOT 0.5 08/21/2021   GFRNONAA 57 (L) 11/03/2016   GFRAA >60 11/03/2016     Medications: I have reviewed the patient's current  medications.   Assessment/Plan: Hypercoagulation syndrome- recurrent venous thrombosis Pulmonary embolism in April 2015 and saddle pulmonary embolism and right lower extremity DVT in October 2016 Both episodes of pulmonary embolism appeared "provoked" 10/01/2015-right lower extremity femoral DVT while on Coumadin. Anticoagulation changed from Arixtra to apixaban January 2021 HIV infection Hypertension History of hepatitis B infection Chronic right plantar fasciitis Hyperlipidemia Chronic mild thrombocytopenia BPH     Disposition: Mr. Michael [name] is maintained on indefinite anticoagulation with a history of recurrent venous thromboembolic disease.  He appears to be tolerating apixaban well.  He will continue apixaban.  We discussed reduced intensity anticoagulation.  He is comfortable continuing apixaban at the current dose.  He will contact us if a procedure is planned for the BPH.  We will recommend management of anticoagulation surrounding a procedure.  He would like to continue follow-up in the hematology clinic here.  Mr. Michael [name] return for an office visit in 1 year.  Thornton Papas, MD  04/13/2023  11:51 AM

## 2023-04-20 ENCOUNTER — Encounter: Payer: Medicare HMO | Admitting: Physical Therapy

## 2023-04-27 ENCOUNTER — Ambulatory Visit: Payer: Medicare HMO | Admitting: Physical Therapy

## 2023-04-27 ENCOUNTER — Encounter: Payer: Self-pay | Admitting: Physical Therapy

## 2023-04-27 DIAGNOSIS — M5416 Radiculopathy, lumbar region: Secondary | ICD-10-CM | POA: Diagnosis not present

## 2023-04-27 DIAGNOSIS — M5442 Lumbago with sciatica, left side: Secondary | ICD-10-CM | POA: Diagnosis not present

## 2023-04-27 DIAGNOSIS — G8929 Other chronic pain: Secondary | ICD-10-CM

## 2023-04-27 DIAGNOSIS — M5441 Lumbago with sciatica, right side: Secondary | ICD-10-CM

## 2023-04-27 DIAGNOSIS — R262 Difficulty in walking, not elsewhere classified: Secondary | ICD-10-CM | POA: Diagnosis not present

## 2023-04-27 DIAGNOSIS — M6281 Muscle weakness (generalized): Secondary | ICD-10-CM

## 2023-04-27 NOTE — Therapy (Signed)
OUTPATIENT PHYSICAL THERAPY THORACOLUMBAR    Patient Name: Michael [name] MRN: [id number] DOB:1956/08/17, 67 y.o., male Today's Date: 04/27/2023  END OF SESSION:  PT End of Session - 04/27/23 1224     Visit Number 5    Number of Visits 12    Date for PT Re-Evaluation 06/12/23    Authorization Type [employer]:    PT Start Time 1145    PT Stop Time 1225    PT Time Calculation (min) 40 min    Activity Tolerance Patient tolerated treatment well    Behavior During Therapy [facility] for tasks assessed/performed               Past Medical History:  Diagnosis Date   Anxiety    Childhood asthma    DVT (deep venous thrombosis) (HCC) 07/2015; 10/01/2015   RLE; RLE   GERD (gastroesophageal reflux disease)    Hepatitis B    HIV (human immunodeficiency virus infection) (HCC)    HTN (hypertension)    Hyperlipidemia    Pneumonia ~ 2000   Pulmonary embolism (HCC) 12/2013   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   Past Surgical History:  Procedure Laterality Date   ANTERIOR CERVICAL DECOMP/DISCECTOMY FUSION  2010   C40-C5   APPENDECTOMY     BACK SURGERY     COLONOSCOPY     SHOULDER ARTHROSCOPY W/ ROTATOR CUFF REPAIR Bilateral 2000's   "twice on right; once on the left"   SHOULDER ARTHROSCOPY WITH SUBACROMIAL DECOMPRESSION Right 12/15/2013   Procedure: RIGHT SHOULDER ARTHROSCOPY WITH EXTENSIVE DEBRIDEMENT OF ROTATOR CUFF,SUBACROMIAL DECOMPRESSION, DEBRIDMENT OF BURSA;  Surgeon: Loreta Ave, MD;  Location: [facility];  Service: Orthopedics;  Laterality: Right;   TONSILLECTOMY     Patient Active Problem List   Diagnosis Date Noted   Acute deep vein thrombosis (DVT) of right lower extremity (HCC) 10/01/2015   Sinusitis 10/01/2015   Hypokalemia 10/01/2015   DVT (deep venous thrombosis) (HCC) 10/01/2015   Encounter for therapeutic drug monitoring 08/03/2015   Essential hypertension 07/29/2015   Hyperlipidemia 07/29/2015   Pneumonia 02/01/2014   HIV disease (HCC)  02/01/2014   Acute pulmonary embolism (HCC) 02/01/2014   Pulmonary embolism (HCC) 01/31/2014   Impingement syndrome of right shoulder 12/15/2013   Sacroiliac joint pain 04/12/2013   Pain in joint, lower leg 02/24/2013   Pain in left hip 02/24/2013   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 02/24/2013   Abnormal EKG 07/07/2011   Preop cardiovascular exam 07/07/2011   ABDOMINAL PAIN, EPIGASTRIC 03/09/2009   UNSPECIFIED LABYRINTHITIS 01/24/2009   FOOT PAIN, LEFT 11/03/2008   MORTON'S NEUROMA, 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   PLANTAR FASCIITIS, BILATERAL 09/26/2008   Hepatitis B virus infection 10/18/2007   VENEREAL WART 10/18/2007   LIVER HEMANGIOMA 10/18/2007   HYPERLIPIDEMIA 10/18/2007   DEPRESSION 10/18/2007   HAY FEVER 10/18/2007   PNEUMONIA 10/18/2007   ASTHMA, CHILDHOOD 10/18/2007   GASTROESOPHAGEAL REFLUX DISEASE 10/18/2007   Other acquired absence of organ 10/18/2007   ROTATOR CUFF REPAIR, RIGHT, HX OF 10/18/2007   HIV DISEASE 08/05/2007    PCP: Dois Davenport, MD   REFERRING PROVIDER: Juanda Chance, NP   REFERRING DIAG: M54.16 (ICD-10-CM) - Lumbar radiculopathy M54.42,M54.41,G89.29 (ICD-10-CM) - Chronic bilateral low back pain with bilateral sciatica  Rationale for Evaluation and Treatment: Rehabilitation  THERAPY DIAG:  Lumbar radiculopathy  Chronic bilateral low back pain with bilateral sciatica  Muscle weakness (generalized)  Difficulty in walking, not elsewhere classified  ONSET DATE: about a year ago  SUBJECTIVE:                                                                                                                                                                                           SUBJECTIVE STATEMENT:  Pt reporting no radiation down Rt LE. Pt stating the DN has helped. Pt still reporting 6/10 pain upon arrival with very stiff gait noted.   PERTINENT HISTORY:  pulmonary embolisms, anxiety, DVT, asthma, HTN, pneumonia, GERD, HIV  PAIN:  NPRS  scale:4-5/10, but can increase with certain activities.  Pain location: bilateral hips and knees expanding from low back, more down left LE at times Pain description: achy, burning, can be stabbing Aggravating factors: sitting Relieving factors: walking  PRECAUTIONS for DN: blood thinner: Eliquis, HIV   WEIGHT BEARING RESTRICTIONS: No  FALLS:  Has patient fallen in last 6 months? No  LIVING ENVIRONMENT: Lives with: lives with their spouse Lives in: House/apartment Stairs: Yes: Internal: 1 flight  steps; on left going up Has following equipment at home: None  OCCUPATION: retired, Scientist, research (physical sciences)  PLOF: Independent  PATIENT GOALS: stop hurting, get back to working out without pain  Next MD Visit: none made a present time   OBJECTIVE:   DIAGNOSTIC FINDINGS:  FINDINGS: There is no evidence of lumbar spine fracture. Alignment is normal. Disc spaces are maintained. Degenerative endplate osteophytes are again seen at L4-L5 and L5-S1 similar to prior examination. There has been progression of degenerative facet change at L5-S1. Soft tissues demonstrate atherosclerotic calcifications in the aorta.   IMPRESSION: 1. Progressive degenerative changes of facet joints at L5-S1. 2. No evidence for fracture or malalignment.  PATIENT SURVEYS:  03/16/23: FOTO eval:     56%   SCREENING FOR RED FLAGS: Bowel or bladder incontinence: No Cauda equina syndrome: No  COGNITION: Overall cognitive status: WFL normal      SENSATION: WFL  MUSCLE LENGTH: 03/16/23 Hamstrings: Right 70 deg; Left 68 deg   POSTURE:  rounded shoulders and forward head  PALPATION: 03/16/23: TTP: lumbar paraspinals  LUMBAR ROM:   AROM Eval 03/16/23 04/27/23  Flexion 30 c pain 50  Extension 5 c pain   Right lateral flexion    Left lateral flexion    Right rotation Limited 50% due to pain Limited 25%  Left rotation Limited 50% due to pain Limited 25%    (Blank rows = not tested)  LOWER  EXTREMITY MMT:     MMT  Right 03/16/23 Left 03/16/23  Hip flexion 5 4  Hip extension    Hip abduction 5 4  Hip adduction 5 4  Hip internal rotation  Hip external rotation    Knee flexion 5 4  Knee extension 5 5  Ankle dorsiflexion    Ankle plantarflexion    Ankle inversion    Ankle eversion     (Blank rows = not tested)  LOWER EXTREMITY ROM:    ROM Right 03/16/23 Left 03/16/23  Hip flexion 95 c pain in groin 90 c tightness in groin  Hip extension    Hip abduction    Hip adduction    Hip internal rotation    Hip external rotation    Knee flexion    Knee extension    Ankle dorsiflexion    Ankle plantarflexion    Ankle inversion    Ankle eversion     (Blank rows = not tested)  LUMBAR SPECIAL TESTS:       03/16/23: Straight leg raise test: Positive  FUNCTIONAL TESTS:        03/16/23:  5 times sit to stand: 10 seconds no UE support  GAIT: Distance walked: 30 feet level surface Assistive device utilized: None Level of assistance: Complete Independence Comments: WFL                                                                                                                                                                                                                  TODAY'S TREATMENT:                                                                                                         DATE:  04/27/23:  TherEx:  Nustep: level 6 x 8 minutes Supine: trunk rotation in figure 4 position x 3 bil holding 20 sec Piriformis stretch: pushing knee away x 2 holding 20 sec bil LEs Piriformis stretch: pulling knee toward opposite side x 2 bil LEs holding 20 sec Prone press ups: x 4 holding 20 seconds Child's pose x 3 holding 30 sec Manual:  Skilled palpation of active trigger points during TPDN STM to Rt QL and lumbar paraspinals Trigger Point Dry-Needling  Treatment instructions: Expect mild to moderate muscle soreness. S/S of  pneumothorax if dry needled over a lung  field, and to seek immediate medical attention should they occur. Patient verbalized understanding of these instructions and education.  Patient Consent Given: Yes Education handout provided: Previously provided Muscles treated: Rt piriformis and glutes  Electrical stimulation performed: No Parameters: N/A Treatment response/outcome: twitch response noted, pt reporting less tightness      04/06/23 TherEx:  Recumbent bike: x 8 minutes Verbally discussed alternative techniques to HEP stretches Traction:  Mechanical traction: Lumbar protocol x 25 minutes: max pull: 90#, min pull 75#     03/30/23:  TherEx:  Prone: hip extension: 2 x 10 bil LE Prone press ups x 4 holding 20 sec Supine: trunk rotation in figure 4 position x 3 bil holding 20 sec Piriformis stretch: pushing knee away x 2 holding 20 sec bil LEs Piriformis stretch: pulling knee toward opposite side x 2 bil LEs holding 20 sec Standing rows: x 20 c blue TB Standing trunk extension: elbows on wall x 5 holding 10 sec TRX squat stretch x 10  Manual:  Skilled palpation of active trigger points during TPDN STM to Rt QL and lumbar paraspinals Trigger Point Dry-Needling  Treatment instructions: Expect mild to moderate muscle soreness. S/S of pneumothorax if dry needled over a lung field, and to seek immediate medical attention should they occur. Patient verbalized understanding of these instructions and education.  Patient Consent Given: Yes Education handout provided: Previously provided Muscles treated: lumbar multifidi on left, left piriformis. glutes Electrical stimulation performed: No Parameters: N/A Treatment response/outcome: twitch response noted, pt reporting less tightness      03/23/23:  TherEx:  Nustep: level 5 x 8 minutes UE/LE  Supine: trunk rotation in figure 4 position x 3 bil holding 20 sec Supine SLR: x 15 bil LE Piriformis stretch: pushing knee away x 2 holding 20 sec bil LEs Piriformis stretch:  pulling knee toward opposite side x 2 bil LEs holding 20 sec Bridge: x 10 holding 5 sec Seated hamstring stretch: bil LEs x 20 seconds Manual:  Skilled palpation of active trigger points during TPDN STM to Rt QL and lumbar paraspinals Trigger Point Dry-Needling  Treatment instructions: Expect mild to moderate muscle soreness. S/S of pneumothorax if dry needled over a lung field, and to seek immediate medical attention should they occur. Patient verbalized understanding of these instructions and education.  Patient Consent Given: Yes Education handout provided: Previously provided Muscles treated: lumbar multifidi bil Electrical stimulation performed: No Parameters: N/A Treatment response/outcome: twitch response noted, pt reporting less tightness at end of session Pt instructed in self soft tissue massage using tennis  03/16/23:   Therex:   HEP instruction/performance c cues for techniques, handout provided.  Trial set performed of each for comprehension and symptom assessment.  See below for exercise list     PATIENT EDUCATION:  Education details: HEP, POC Person educated: Patient Education method: Explanation, Demonstration, Verbal cues, and Handouts Education comprehension: verbalized understanding, returned demonstration, and verbal cues required  HOME EXERCISE PROGRAM: Access Code: [email or web address] URL: [email or web address] Date: 03/16/2023 Prepared by: Narda Amber  Exercises - Supine Lower Trunk Rotation  - 1-2 x daily - 7 x weekly - 3 reps - 20 seconds hold - Hooklying Single Knee to Chest Stretch  - 1-2 x daily - 7 x weekly - 3 reps - 20 seconds hold - Supine Figure 4 Piriformis Stretch  - 1-2 x daily - 7 x weekly - 3 reps - 20 seconds hold - Supine Bridge  - 1-2 x  daily - 7 x weekly - 10 reps - 5 seconds it ban hold - Supine ITB Stretch with Strap  - 1-2 x daily - 7 x weekly - 3 reps - 20 seconds hold - Standing Hip Flexor Stretch  - 1-2 x daily - 7 x  weekly - 3 reps - 20 seconds hold  ASSESSMENT:  CLINICAL IMPRESSION: Pt arriving today with 6/10 pain on his Rt side low back. Pt amb into clinic with stiffness noted in his gait. Pt able to perform all exercises and requesting DN again today. Pt with multiple twitch responses noted. Pt reporting 1/10 pain at end of session. Continue skilled PT interventions to maximize pt's function.    OBJECTIVE IMPAIRMENTS: decreased balance, decreased mobility, difficulty walking, decreased ROM, decreased strength, impaired flexibility, and pain.   ACTIVITY LIMITATIONS: bending, sitting, standing, squatting, and sleeping  PARTICIPATION LIMITATIONS: community activity and occupation  PERSONAL FACTORS: 3+ comorbidities: see above pertinent history  are also affecting patient's functional outcome.   REHAB POTENTIAL: Good  CLINICAL DECISION MAKING: Stable/uncomplicated  EVALUATION COMPLEXITY: Low   GOALS: Goals reviewed with patient? Yes  SHORT TERM GOALS: (target date for Short term goals are 3 weeks 04/10/23)  1. Patient will demonstrate independent use of home exercise program to maintain progress from in clinic treatments.  Goal status: MET 03/30/23  LONG TERM GOALS: (target dates for all long term goals are 10 weeks  05/29/23 )   1. Patient will demonstrate/report pain at worst less than or equal to 2/10 to facilitate minimal limitation in daily activity secondary to pain symptoms.  Goal status: On-going 04/06/23   2. Patient will demonstrate independent use of home exercise program to facilitate ability to maintain/progress functional gains from skilled physical therapy services.  Goal status: On-going 04/06/23   3. Patient will demonstrate FOTO outcome > or = 66 % to indicate reduced disability due to condition.  Goal status: New   4. Patient will demonstrate lumbar extension 100 % WFL s symptoms to facilitate upright standing, walking posture at PLOF s limitation.  Goal status: New    5.  Pt will be able to navigate 1 flight of stairs with no rail with reciprocal gait pattern.  Goal status: New   6.  Pt will be able to pick 20# from floor  and place it at counter height with no pain using correct body mechanics.  Goal status: New     PLAN:  PT FREQUENCY: 1-2x/week  PT DURATION: 12 weeks  PLANNED INTERVENTIONS: Therapeutic exercises, Therapeutic activity, Neuro Muscular re-education, Balance training, Gait training, Patient/Family education, Joint mobilization, Stair training, DME instructions, Dry Needling, Electrical stimulation, Cryotherapy, vasopneumatic device, Moist heat, Taping, Traction Ultrasound, Ionotophoresis 4mg /ml Dexamethasone, and aquatic therapy, Manual therapy.  All included unless contraindicated  PLAN FOR NEXT SESSION:  manual/mobs, core strengthening, lumbar and thoracic stretching, DN as needed     Sharmon Leyden, PT, MPT 04/27/2023, 12:35 PM  Referring diagnosis?  M54.16 (ICD-10-CM) - Lumbar radiculopathy M54.42,M54.41,G89.29 (ICD-10-CM) - Chronic bilateral low back pain with bilateral sciatica Treatment diagnosis? (if different than referring diagnosis) M54.16, M54.42, M62.81, R26.2 What was this (referring dx) caused by? []  Surgery []  Fall [x]  Ongoing issue []  Arthritis []  Other: ____________  Laterality: []  Rt []  Lt [x]  Both  Check all possible CPT codes:  *CHOOSE 10 OR LESS*    [x]  97110 (Therapeutic Exercise)  []  16109 (SLP Treatment)  [x]  97112 (Neuro Re-ed)   []  92526 (Swallowing Treatment)   [x]  97116 (Gait  Training)   []  [phone] (Cognitive Training, 1st 15 minutes) [x]  97140 (Manual Therapy)   []  97130 (Cognitive Training, each add'l 15 minutes)  []  97164 (Re-evaluation)                              []  Other, List CPT Code ____________  [x]  97530 (Therapeutic Activities)     [x]  97535 (Self Care)   []  All codes above (97110 - 97535)  [x]  62130 (Mechanical Traction)  [x]  97014 (E-stim Unattended)  []  97032 (E-stim  manual)  []  97033 (Ionto)  []  97035 (Ultrasound) []  97750 (Physical Performance Training) []  [id number] (Aquatic Therapy) []  97016 (Vasopneumatic Device) []  [id number] (Paraffin) []  97034 (Contrast Bath) []  97597 (Wound Care 1st 20 sq cm) []  97598 (Wound Care each add'l 20 sq cm) []  97760 (Orthotic Fabrication, Fitting, Training Initial) []  [id number] (Prosthetic Management and Training Initial) []  [id number] (Orthotic or Prosthetic Training/ Modification Subsequent)

## 2023-04-30 ENCOUNTER — Telehealth: Payer: Self-pay | Admitting: Physical Medicine and Rehabilitation

## 2023-04-30 MED ORDER — METHOCARBAMOL 500 MG PO TABS: 500.0000 mg | ORAL_TABLET | Freq: Three times a day (TID) | ORAL | 0 refills | Status: DC | PRN

## 2023-04-30 NOTE — Telephone Encounter (Signed)
Spoke with patient and informed him of the information below. He would like a muscle relaxer sent to [facility]

## 2023-04-30 NOTE — Telephone Encounter (Signed)
Patient called advised he had dry needling done on Monday and he's having spasm on both sides of his back. Patient said the spasms are very uncomfortable. Patient said he is afraid to take a breath. The number to contact patient is [phone]

## 2023-05-03 ENCOUNTER — Other Ambulatory Visit: Payer: Self-pay | Admitting: Orthopaedic Surgery

## 2023-05-03 MED ORDER — METHYLPREDNISOLONE 4 MG PO TBPK: ORAL_TABLET | ORAL | 0 refills | Status: DC

## 2023-05-04 ENCOUNTER — Encounter: Payer: Medicare HMO | Admitting: Physical Therapy

## 2023-05-04 ENCOUNTER — Telehealth: Payer: Self-pay | Admitting: Physical Medicine and Rehabilitation

## 2023-05-04 NOTE — Telephone Encounter (Signed)
Patient called he needs something else. He just found out its a nerve issue and is in pain.CB#[phone]

## 2023-05-06 ENCOUNTER — Telehealth: Payer: Self-pay | Admitting: Physical Medicine and Rehabilitation

## 2023-05-06 NOTE — Telephone Encounter (Signed)
Spoke with patient on Tuesday afternoon, he reports recent onset of "large bulge" to abdomen. He denies pain to site, however he is concerned this could be a hernia. I informed patient that if he is concerned he needs to go to emergency department. I do not feel this is directly correlated with spine.

## 2023-05-07 ENCOUNTER — Other Ambulatory Visit (HOSPITAL_COMMUNITY): Payer: Self-pay | Admitting: Nurse Practitioner

## 2023-05-07 DIAGNOSIS — R1900 Intra-abdominal and pelvic swelling, mass and lump, unspecified site: Secondary | ICD-10-CM

## 2023-05-07 NOTE — Telephone Encounter (Signed)
See telephone encounter from Ellin Goodie, NP

## 2023-05-08 ENCOUNTER — Encounter (HOSPITAL_COMMUNITY): Payer: Self-pay

## 2023-05-08 ENCOUNTER — Ambulatory Visit (HOSPITAL_COMMUNITY)
Admission: RE | Admit: 2023-05-08 | Discharge: 2023-05-08 | Disposition: A | Discharge status: Home / Self Care | Payer: Medicare HMO | Source: Ambulatory Visit | Attending: Nurse Practitioner | Admitting: Nurse Practitioner

## 2023-05-08 DIAGNOSIS — R1900 Intra-abdominal and pelvic swelling, mass and lump, unspecified site: Secondary | ICD-10-CM | POA: Diagnosis not present

## 2023-05-08 MED ORDER — IOHEXOL 350 MG/ML SOLN
75.0000 mL | Freq: Once | INTRAVENOUS | Status: AC | PRN
  Administered 2023-05-08: 75 mL via INTRAVENOUS

## 2023-05-11 ENCOUNTER — Telehealth: Payer: Self-pay | Admitting: Physical Therapy

## 2023-05-11 ENCOUNTER — Encounter: Payer: Medicare HMO | Admitting: Physical Therapy

## 2023-05-11 NOTE — Telephone Encounter (Signed)
I called to follow up with pt about his onset of his low back and abdominal pain. Pt stating he lifted his a heavy box not expecting it to weigh almost 45 pounds.  Pt feels he may have strained his back/abdominals. Pt stating he had a large grapefruit size mass that radiates pain down on his left side.  Pt stating his MD thinks it may be a hernia. Pt reported going for CT scan on Friday and is still waiting results. Pt stating his pain has went from sharp pain to dull achy pain over the last couple of days. Pt stating sleeping is still limited by pain, however it's improved since Dr. Steward Drone called in a Medrol dosepak. Pt wishing to cancel his afternoon PT appointment today until he knows more information.   Narda Amber, PT, MPT 05/11/23 8:14 AM

## 2023-05-11 NOTE — Telephone Encounter (Signed)
I spoke to pt again today regarding his new onset of abdominal pain and reports of mass on his left side. Pt stating his MD thinks it may be muscular related after reviewing his CT scan. Pt stating his pain is definitely better since initial pain began 1 week ago. I advised pt to come in next Monday and we can reassess this pain and ability to exercise. I explained if conditions worsen I recommend he follow up with one of our Orthopedic surgeons. Pt stating he has seen Dr. Roda Shutters in the past and would like to return to his care if needed.   Narda Amber, PT, MPT 05/11/23 3:05 PM

## 2023-05-18 ENCOUNTER — Encounter: Payer: Self-pay | Admitting: Physical Therapy

## 2023-05-18 ENCOUNTER — Ambulatory Visit: Payer: Medicare HMO | Admitting: Physical Therapy

## 2023-05-18 DIAGNOSIS — M5416 Radiculopathy, lumbar region: Secondary | ICD-10-CM

## 2023-05-18 DIAGNOSIS — M5442 Lumbago with sciatica, left side: Secondary | ICD-10-CM

## 2023-05-18 DIAGNOSIS — M6281 Muscle weakness (generalized): Secondary | ICD-10-CM

## 2023-05-18 DIAGNOSIS — G8929 Other chronic pain: Secondary | ICD-10-CM

## 2023-05-18 DIAGNOSIS — M5441 Lumbago with sciatica, right side: Secondary | ICD-10-CM

## 2023-05-18 DIAGNOSIS — R262 Difficulty in walking, not elsewhere classified: Secondary | ICD-10-CM

## 2023-05-18 NOTE — Therapy (Signed)
OUTPATIENT PHYSICAL THERAPY THORACOLUMBAR    Patient Name: Michael [name] MRN: [id number] DOB:[birth year], 67 y.o., male Today's Date: 05/18/2023  END OF SESSION:  PT End of Session - 05/18/23 1245     Visit Number 6    Number of Visits 12    Date for PT Re-Evaluation 06/12/23    Authorization Type [employer]:    PT Start Time 1148    PT Stop Time 1230    PT Time Calculation (min) 42 min    Activity Tolerance Patient tolerated treatment well    Behavior During Therapy [facility] for tasks assessed/performed               Past Medical History:  Diagnosis Date   Anxiety    Childhood asthma    DVT (deep venous thrombosis) (HCC) 07/2015; 10/01/2015   RLE; RLE   GERD (gastroesophageal reflux disease)    Hepatitis B    HIV (human immunodeficiency virus infection) (HCC)    HTN (hypertension)    Hyperlipidemia    Pneumonia ~ 2000   Pulmonary embolism (HCC) 12/2013   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   Past Surgical History:  Procedure Laterality Date   ANTERIOR CERVICAL DECOMP/DISCECTOMY FUSION  2010   C40-C5   APPENDECTOMY     BACK SURGERY     COLONOSCOPY     SHOULDER ARTHROSCOPY W/ ROTATOR CUFF REPAIR Bilateral 2000's   "twice on right; once on the left"   SHOULDER ARTHROSCOPY WITH SUBACROMIAL DECOMPRESSION Right 12/15/2013   Procedure: RIGHT SHOULDER ARTHROSCOPY WITH EXTENSIVE DEBRIDEMENT OF ROTATOR CUFF,SUBACROMIAL DECOMPRESSION, DEBRIDMENT OF BURSA;  Surgeon: Loreta Ave, MD;  Location: [facility];  Service: Orthopedics;  Laterality: Right;   TONSILLECTOMY     Patient Active Problem List   Diagnosis Date Noted   Acute deep vein thrombosis (DVT) of right lower extremity (HCC) 10/01/2015   Sinusitis 10/01/2015   Hypokalemia 10/01/2015   DVT (deep venous thrombosis) (HCC) 10/01/2015   Encounter for therapeutic drug monitoring 08/03/2015   Essential hypertension 07/29/2015   Hyperlipidemia 07/29/2015   Pneumonia 02/01/2014   HIV disease (HCC)  02/01/2014   Acute pulmonary embolism (HCC) 02/01/2014   Pulmonary embolism (HCC) 01/31/2014   Impingement syndrome of right shoulder 12/15/2013   Sacroiliac joint pain 04/12/2013   Pain in joint, lower leg 02/24/2013   Pain in left hip 02/24/2013   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 02/24/2013   Abnormal EKG 07/07/2011   Preop cardiovascular exam 07/07/2011   ABDOMINAL PAIN, EPIGASTRIC 03/09/2009   UNSPECIFIED LABYRINTHITIS 01/24/2009   FOOT PAIN, LEFT 11/03/2008   MORTON'S NEUROMA, 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   PLANTAR FASCIITIS, BILATERAL 09/26/2008   Hepatitis B virus infection 10/18/2007   VENEREAL WART 10/18/2007   LIVER HEMANGIOMA 10/18/2007   HYPERLIPIDEMIA 10/18/2007   DEPRESSION 10/18/2007   HAY FEVER 10/18/2007   PNEUMONIA 10/18/2007   ASTHMA, CHILDHOOD 10/18/2007   GASTROESOPHAGEAL REFLUX DISEASE 10/18/2007   Other acquired absence of organ 10/18/2007   ROTATOR CUFF REPAIR, RIGHT, HX OF 10/18/2007   HIV DISEASE 08/05/2007    PCP: Dois Davenport, MD   REFERRING PROVIDER: Juanda Chance, NP   REFERRING DIAG: M54.16 (ICD-10-CM) - Lumbar radiculopathy M54.42,M54.41,G89.29 (ICD-10-CM) - Chronic bilateral low back pain with bilateral sciatica  Rationale for Evaluation and Treatment: Rehabilitation  THERAPY DIAG:  Lumbar radiculopathy  Chronic bilateral low back pain with bilateral sciatica  Muscle weakness (generalized)  Difficulty in walking, not elsewhere classified  ONSET DATE: about a year ago  SUBJECTIVE:                                                                                                                                                                                           SUBJECTIVE STATEMENT:  Pt arriving today reporting Rt sided low back pain. Pt also with now large abdominal protrusion on his Rt flank that pt is extremely concerned about.   PERTINENT HISTORY:  pulmonary embolisms, anxiety, DVT, asthma, HTN, pneumonia, GERD, HIV  PAIN:   NPRS scale:5/10 Rt sided low back  Pain description: achy, burning, can be stabbing Aggravating factors: sitting Relieving factors: walking  PRECAUTIONS for DN: blood thinner: Eliquis, HIV   WEIGHT BEARING RESTRICTIONS: No  FALLS:  Has patient fallen in last 6 months? No  LIVING ENVIRONMENT: Lives with: lives with their spouse Lives in: House/apartment Stairs: Yes: Internal: 1 flight  steps; on left going up Has following equipment at home: None  OCCUPATION: retired, Scientist, research (physical sciences)  PLOF: Independent  PATIENT GOALS: stop hurting, get back to working out without pain  Next MD Visit: none made a present time   OBJECTIVE:   DIAGNOSTIC FINDINGS:  FINDINGS: There is no evidence of lumbar spine fracture. Alignment is normal. Disc spaces are maintained. Degenerative endplate osteophytes are again seen at L4-L5 and L5-S1 similar to prior examination. There has been progression of degenerative facet change at L5-S1. Soft tissues demonstrate atherosclerotic calcifications in the aorta.   IMPRESSION: 1. Progressive degenerative changes of facet joints at L5-S1. 2. No evidence for fracture or malalignment.  PATIENT SURVEYS:  03/16/23: FOTO eval:     56%   SCREENING FOR RED FLAGS: Bowel or bladder incontinence: No Cauda equina syndrome: No  COGNITION: Overall cognitive status: WFL normal      SENSATION: WFL  MUSCLE LENGTH: 03/16/23 Hamstrings: Right 70 deg; Left 68 deg   POSTURE:  rounded shoulders and forward head  PALPATION: 03/16/23: TTP: lumbar paraspinals  LUMBAR ROM:   AROM Eval 03/16/23 04/27/23 05/18/23  Flexion 30 c pain 50 60  Extension 5 c pain    Right lateral flexion     Left lateral flexion     Right rotation Limited 50% due to pain Limited 25% Limited due to Rt flank pain  Left rotation Limited 50% due to pain Limited 25%  Limited due to Rt flank pain   (Blank rows = not tested)  LOWER EXTREMITY MMT:     MMT  Right 03/16/23  Left 03/16/23  Hip flexion 5 4  Hip extension    Hip abduction 5 4  Hip adduction 5 4  Hip internal rotation  Hip external rotation    Knee flexion 5 4  Knee extension 5 5  Ankle dorsiflexion    Ankle plantarflexion    Ankle inversion    Ankle eversion     (Blank rows = not tested)  LOWER EXTREMITY ROM:    ROM Right 03/16/23 Left 03/16/23  Hip flexion 95 c pain in groin 90 c tightness in groin  Hip extension    Hip abduction    Hip adduction    Hip internal rotation    Hip external rotation    Knee flexion    Knee extension    Ankle dorsiflexion    Ankle plantarflexion    Ankle inversion    Ankle eversion     (Blank rows = not tested)  LUMBAR SPECIAL TESTS:       03/16/23: Straight leg raise test: Positive  FUNCTIONAL TESTS:        03/16/23:  5 times sit to stand: 10 seconds no UE support  GAIT: Distance walked: 30 feet level surface Assistive device utilized: None Level of assistance: Complete Independence Comments: WFL                                                                                                                                                                                                                  TODAY'S TREATMENT:                                                                                                         DATE:  05/18/23:  TherEx:  Seated marching with abdominal activation x 20 Supine marching with abdominal activation Single leg bridge x 10 bil LE Gentle abdominal crunches with shoulders flexed in multiple planes x 5 each Abdominal isometrics in supine and sitting Manual:  STM to Rt QL and lumbar and thoracic paraspinals (using massage cream and Biofreeze) Performed in seated position Ace wrapped pt's abdominals to help support abdominal bulge to improve pt's comfort    04/27/23:  TherEx:  Nustep: level 6 x 8 minutes Supine: trunk rotation in figure 4 position x  3 bil holding 20 sec Piriformis stretch: pushing  knee away x 2 holding 20 sec bil LEs Piriformis stretch: pulling knee toward opposite side x 2 bil LEs holding 20 sec Prone press ups: x 4 holding 20 seconds Child's pose x 3 holding 30 sec Manual:  Skilled palpation of active trigger points during TPDN STM to Rt QL and lumbar paraspinals Trigger Point Dry-Needling  Treatment instructions: Expect mild to moderate muscle soreness. S/S of pneumothorax if dry needled over a lung field, and to seek immediate medical attention should they occur. Patient verbalized understanding of these instructions and education.  Patient Consent Given: Yes Education handout provided: Previously provided Muscles treated: Rt piriformis and glutes  Electrical stimulation performed: No Parameters: N/A Treatment response/outcome: twitch response noted, pt reporting less tightness      04/06/23 TherEx:  Recumbent bike: x 8 minutes Verbally discussed alternative techniques to HEP stretches Traction:  Mechanical traction: Lumbar protocol x 25 minutes: max pull: 90#, min pull 75#    PATIENT EDUCATION:  Education details: HEP, POC Person educated: Patient Education method: Programmer, multimedia, Demonstration, Verbal cues, and Handouts Education comprehension: verbalized understanding, returned demonstration, and verbal cues required  HOME EXERCISE PROGRAM: Access Code: [email or web address] URL: [email or web address] Date: 03/16/2023 Prepared by: Narda Amber  Exercises - Supine Lower Trunk Rotation  - 1-2 x daily - 7 x weekly - 3 reps - 20 seconds hold - Hooklying Single Knee to Chest Stretch  - 1-2 x daily - 7 x weekly - 3 reps - 20 seconds hold - Supine Figure 4 Piriformis Stretch  - 1-2 x daily - 7 x weekly - 3 reps - 20 seconds hold - Supine Bridge  - 1-2 x daily - 7 x weekly - 10 reps - 5 seconds it ban hold - Supine ITB Stretch with Strap  - 1-2 x daily - 7 x weekly - 3 reps - 20 seconds hold - Standing Hip Flexor Stretch  - 1-2 x daily - 7 x weekly  - 3 reps - 20 seconds hold  ASSESSMENT:  CLINICAL IMPRESSION: Pt instructed in ace wrapping his abdominals for added support. We also discussed purchasing an abdominal support. Pt was instructed and able to return demonstration of core activation exercises. Pt with good response to manual therapy this visit. Continue skilled PT interventions to maximize pt's function.    OBJECTIVE IMPAIRMENTS: decreased balance, decreased mobility, difficulty walking, decreased ROM, decreased strength, impaired flexibility, and pain.   ACTIVITY LIMITATIONS: bending, sitting, standing, squatting, and sleeping  PARTICIPATION LIMITATIONS: community activity and occupation  PERSONAL FACTORS: 3+ comorbidities: see above pertinent history  are also affecting patient's functional outcome.   REHAB POTENTIAL: Good  CLINICAL DECISION MAKING: Stable/uncomplicated  EVALUATION COMPLEXITY: Low   GOALS: Goals reviewed with patient? Yes  SHORT TERM GOALS: (target date for Short term goals are 3 weeks 04/10/23)  1. Patient will demonstrate independent use of home exercise program to maintain progress from in clinic treatments.  Goal status: MET 03/30/23  LONG TERM GOALS: (target dates for all long term goals are 10 weeks  05/29/23 )   1. Patient will demonstrate/report pain at worst less than or equal to 2/10 to facilitate minimal limitation in daily activity secondary to pain symptoms.  Goal status: On-going 04/06/23   2. Patient will demonstrate independent use of home exercise program to facilitate ability to maintain/progress functional gains from skilled physical therapy services.  Goal status: On-going 04/06/23   3. Patient will demonstrate FOTO outcome > or = 66 %  to indicate reduced disability due to condition.  Goal status: New   4. Patient will demonstrate lumbar extension 100 % WFL s symptoms to facilitate upright standing, walking posture at PLOF s limitation.  Goal status: New   5.  Pt will be  able to navigate 1 flight of stairs with no rail with reciprocal gait pattern.  Goal status: New   6.  Pt will be able to pick 20# from floor  and place it at counter height with no pain using correct body mechanics.  Goal status: New     PLAN:  PT FREQUENCY: 1-2x/week  PT DURATION: 12 weeks  PLANNED INTERVENTIONS: Therapeutic exercises, Therapeutic activity, Neuro Muscular re-education, Balance training, Gait training, Patient/Family education, Joint mobilization, Stair training, DME instructions, Dry Needling, Electrical stimulation, Cryotherapy, vasopneumatic device, Moist heat, Taping, Traction Ultrasound, Ionotophoresis 4mg /ml Dexamethasone, and aquatic therapy, Manual therapy.  All included unless contraindicated  PLAN FOR NEXT SESSION:  manual/mobs, core strengthening, lumbar and thoracic stretching, DN as needed     Sharmon Leyden, PT, MPT 05/18/2023, 12:57 PM  Referring diagnosis?  M54.16 (ICD-10-CM) - Lumbar radiculopathy M54.42,M54.41,G89.29 (ICD-10-CM) - Chronic bilateral low back pain with bilateral sciatica Treatment diagnosis? (if different than referring diagnosis) M54.16, M54.42, M62.81, R26.2 What was this (referring dx) caused by? []  Surgery []  Fall [x]  Ongoing issue []  Arthritis []  Other: ____________  Laterality: []  Rt []  Lt [x]  Both  Check all possible CPT codes:  *CHOOSE 10 OR LESS*    [x]  97110 (Therapeutic Exercise)  []  92507 (SLP Treatment)  [x]  97112 (Neuro Re-ed)   []  92526 (Swallowing Treatment)   [x]  97116 (Gait Training)   []  [id number] (Cognitive Training, 1st 15 minutes) [x]  97140 (Manual Therapy)   []  97130 (Cognitive Training, each add'l 15 minutes)  []  97164 (Re-evaluation)                              []  Other, List CPT Code ____________  [x]  97530 (Therapeutic Activities)     [x]  97535 (Self Care)   []  All codes above (97110 - 97535)  [x]  97012 (Mechanical Traction)  [x]  97014 (E-stim Unattended)  []  97032 (E-stim manual)  []  97033  (Ionto)  []  97035 (Ultrasound) []  97750 (Physical Performance Training) []  [id number] (Aquatic Therapy) []  97016 (Vasopneumatic Device) []  [id number] (Paraffin) []  97034 (Contrast Bath) []  97597 (Wound Care 1st 20 sq cm) []  97598 (Wound Care each add'l 20 sq cm) []  97760 (Orthotic Fabrication, Fitting, Training Initial) []  [id number] (Prosthetic Management and Training Initial) []  [id number] (Orthotic or Prosthetic Training/ Modification Subsequent)

## 2023-05-25 ENCOUNTER — Encounter: Payer: Self-pay | Admitting: Physical Therapy

## 2023-05-25 ENCOUNTER — Ambulatory Visit: Payer: Medicare HMO | Admitting: Physical Therapy

## 2023-05-25 DIAGNOSIS — M5442 Lumbago with sciatica, left side: Secondary | ICD-10-CM

## 2023-05-25 DIAGNOSIS — M6281 Muscle weakness (generalized): Secondary | ICD-10-CM

## 2023-05-25 DIAGNOSIS — M5416 Radiculopathy, lumbar region: Secondary | ICD-10-CM

## 2023-05-25 DIAGNOSIS — M5441 Lumbago with sciatica, right side: Secondary | ICD-10-CM

## 2023-05-25 DIAGNOSIS — R262 Difficulty in walking, not elsewhere classified: Secondary | ICD-10-CM | POA: Diagnosis not present

## 2023-05-25 DIAGNOSIS — G8929 Other chronic pain: Secondary | ICD-10-CM

## 2023-05-25 NOTE — Therapy (Signed)
OUTPATIENT PHYSICAL THERAPY THORACOLUMBAR    Patient Name: Michael [name] MRN: [id number] DOB:May 31, 1956, 67 y.o., male Today's Date: 05/25/2023  END OF SESSION:  PT End of Session - 05/25/23 1209     Visit Number 7    Number of Visits 12    Date for PT Re-Evaluation 06/12/23    Authorization Type [employer]:    PT Start Time 1151    PT Stop Time 1230    PT Time Calculation (min) 39 min    Activity Tolerance Patient tolerated treatment well    Behavior During Therapy [facility] for tasks assessed/performed               Past Medical History:  Diagnosis Date   Anxiety    Childhood asthma    DVT (deep venous thrombosis) (HCC) 07/2015; 10/01/2015   RLE; RLE   GERD (gastroesophageal reflux disease)    Hepatitis B    HIV (human immunodeficiency virus infection) (HCC)    HTN (hypertension)    Hyperlipidemia    Pneumonia ~ 2000   Pulmonary embolism (HCC) 12/2013   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   Past Surgical History:  Procedure Laterality Date   ANTERIOR CERVICAL DECOMP/DISCECTOMY FUSION  2010   C40-C5   APPENDECTOMY     BACK SURGERY     COLONOSCOPY     SHOULDER ARTHROSCOPY W/ ROTATOR CUFF REPAIR Bilateral 2000's   "twice on right; once on the left"   SHOULDER ARTHROSCOPY WITH SUBACROMIAL DECOMPRESSION Right 12/15/2013   Procedure: RIGHT SHOULDER ARTHROSCOPY WITH EXTENSIVE DEBRIDEMENT OF ROTATOR CUFF,SUBACROMIAL DECOMPRESSION, DEBRIDMENT OF BURSA;  Surgeon: Loreta Ave, MD;  Location: [facility];  Service: Orthopedics;  Laterality: Right;   TONSILLECTOMY     Patient Active Problem List   Diagnosis Date Noted   Acute deep vein thrombosis (DVT) of right lower extremity (HCC) 10/01/2015   Sinusitis 10/01/2015   Hypokalemia 10/01/2015   DVT (deep venous thrombosis) (HCC) 10/01/2015   Encounter for therapeutic drug monitoring 08/03/2015   Essential hypertension 07/29/2015   Hyperlipidemia 07/29/2015   Pneumonia 02/01/2014   HIV disease (HCC)  02/01/2014   Acute pulmonary embolism (HCC) 02/01/2014   Pulmonary embolism (HCC) 01/31/2014   Impingement syndrome of right shoulder 12/15/2013   Sacroiliac joint pain 04/12/2013   Pain in joint, lower leg 02/24/2013   Pain in left hip 02/24/2013   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 02/24/2013   Abnormal EKG 07/07/2011   Preop cardiovascular exam 07/07/2011   ABDOMINAL PAIN, EPIGASTRIC 03/09/2009   UNSPECIFIED LABYRINTHITIS 01/24/2009   FOOT PAIN, LEFT 11/03/2008   MORTON'S NEUROMA, 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   PLANTAR FASCIITIS, BILATERAL 09/26/2008   Hepatitis B virus infection 10/18/2007   VENEREAL WART 10/18/2007   LIVER HEMANGIOMA 10/18/2007   HYPERLIPIDEMIA 10/18/2007   DEPRESSION 10/18/2007   HAY FEVER 10/18/2007   PNEUMONIA 10/18/2007   ASTHMA, CHILDHOOD 10/18/2007   GASTROESOPHAGEAL REFLUX DISEASE 10/18/2007   Other acquired absence of organ 10/18/2007   ROTATOR CUFF REPAIR, RIGHT, HX OF 10/18/2007   HIV DISEASE 08/05/2007    PCP: Dois Davenport, MD   REFERRING PROVIDER: Juanda Chance, NP   REFERRING DIAG: M54.16 (ICD-10-CM) - Lumbar radiculopathy M54.42,M54.41,G89.29 (ICD-10-CM) - Chronic bilateral low back pain with bilateral sciatica  Rationale for Evaluation and Treatment: Rehabilitation  THERAPY DIAG:  Lumbar radiculopathy  Chronic bilateral low back pain with bilateral sciatica  Muscle weakness (generalized)  Difficulty in walking, not elsewhere classified  ONSET DATE: about a year ago  SUBJECTIVE:                                                                                                                                                                                           SUBJECTIVE STATEMENT:  Pt stating he purchased an abdominal binder brace.    PERTINENT HISTORY:  pulmonary embolisms, anxiety, DVT, asthma, HTN, pneumonia, GERD, HIV  PAIN:  NPRS scale: 4-5/10 Rt sided low back  Pain description: achy, burning, can be  stabbing Aggravating factors: sitting Relieving factors: walking  PRECAUTIONS for DN: blood thinner: Eliquis, HIV   WEIGHT BEARING RESTRICTIONS: No  FALLS:  Has patient fallen in last 6 months? No  LIVING ENVIRONMENT: Lives with: lives with their spouse Lives in: House/apartment Stairs: Yes: Internal: 1 flight  steps; on left going up Has following equipment at home: None  OCCUPATION: retired, Scientist, research (physical sciences)  PLOF: Independent  PATIENT GOALS: stop hurting, get back to working out without pain  Next MD Visit: none made a present time   OBJECTIVE:   DIAGNOSTIC FINDINGS:  FINDINGS: There is no evidence of lumbar spine fracture. Alignment is normal. Disc spaces are maintained. Degenerative endplate osteophytes are again seen at L4-L5 and L5-S1 similar to prior examination. There has been progression of degenerative facet change at L5-S1. Soft tissues demonstrate atherosclerotic calcifications in the aorta.   IMPRESSION: 1. Progressive degenerative changes of facet joints at L5-S1. 2. No evidence for fracture or malalignment.  PATIENT SURVEYS:  03/16/23: FOTO eval:     56%   SCREENING FOR RED FLAGS: Bowel or bladder incontinence: No Cauda equina syndrome: No  COGNITION: Overall cognitive status: WFL normal      SENSATION: WFL  MUSCLE LENGTH: 03/16/23 Hamstrings: Right 70 deg; Left 68 deg   POSTURE:  rounded shoulders and forward head  PALPATION: 03/16/23: TTP: lumbar paraspinals  LUMBAR ROM:   AROM Eval 03/16/23 04/27/23 05/18/23  Flexion 30 c pain 50 60  Extension 5 c pain    Right lateral flexion     Left lateral flexion     Right rotation Limited 50% due to pain Limited 25% Limited due to Rt flank pain  Left rotation Limited 50% due to pain Limited 25%  Limited due to Rt flank pain   (Blank rows = not tested)  LOWER EXTREMITY MMT:     MMT  Right 03/16/23 Left 03/16/23  Hip flexion 5 4  Hip extension    Hip abduction 5 4  Hip  adduction 5 4  Hip internal rotation    Hip external rotation    Knee flexion 5 4  Knee extension 5 5  Ankle dorsiflexion    Ankle plantarflexion    Ankle inversion    Ankle eversion     (Blank rows = not tested)  LOWER EXTREMITY ROM:    ROM Right 03/16/23 Left 03/16/23  Hip flexion 95 c pain in groin 90 c tightness in groin  Hip extension    Hip abduction    Hip adduction    Hip internal rotation    Hip external rotation    Knee flexion    Knee extension    Ankle dorsiflexion    Ankle plantarflexion    Ankle inversion    Ankle eversion     (Blank rows = not tested)  LUMBAR SPECIAL TESTS:       03/16/23: Straight leg raise test: Positive  FUNCTIONAL TESTS:        03/16/23:  5 times sit to stand: 10 seconds no UE support  GAIT: Distance walked: 30 feet level surface Assistive device utilized: None Level of assistance: Complete Independence Comments: WFL                                                                                                                                                                                                                  TODAY'S TREATMENT:                                                                                                         DATE:  05/25/23:  Therex:  Bike: level 4 x 5 minutes Leg Press: 100# 2 x15 bil LE Supine dead bug: x 20 Supine single leg bridge x 10 each LE Side lying: hip abd with UE shoulder abd x 15 bil Quadraped: bird dogs: x 15  Manual:  STM to lumbar paraspinals and left QL using massage cream    05/18/23:  TherEx:  Seated marching with abdominal activation x 20 Supine marching with abdominal activation Single leg bridge x 10 bil LE Gentle abdominal crunches with shoulders flexed in multiple planes x 5 each Abdominal isometrics in supine and sitting Manual:  STM to Rt QL and lumbar  and thoracic paraspinals (using massage cream and Biofreeze) Performed in seated position Ace wrapped pt's  abdominals to help support abdominal bulge to improve pt's comfort    04/27/23:  TherEx:  Nustep: level 6 x 8 minutes Supine: trunk rotation in figure 4 position x 3 bil holding 20 sec Piriformis stretch: pushing knee away x 2 holding 20 sec bil LEs Piriformis stretch: pulling knee toward opposite side x 2 bil LEs holding 20 sec Prone press ups: x 4 holding 20 seconds Child's pose x 3 holding 30 sec Manual:  Skilled palpation of active trigger points during TPDN STM to Rt QL and lumbar paraspinals Trigger Point Dry-Needling  Treatment instructions: Expect mild to moderate muscle soreness. S/S of pneumothorax if dry needled over a lung field, and to seek immediate medical attention should they occur. Patient verbalized understanding of these instructions and education.  Patient Consent Given: Yes Education handout provided: Previously provided Muscles treated: Rt piriformis and glutes  Electrical stimulation performed: No Parameters: N/A Treatment response/outcome: twitch response noted, pt reporting less tightness      04/06/23 TherEx:  Recumbent bike: x 8 minutes Verbally discussed alternative techniques to HEP stretches Traction:  Mechanical traction: Lumbar protocol x 25 minutes: max pull: 90#, min pull 75#    PATIENT EDUCATION:  Education details: HEP, POC Person educated: Patient Education method: Programmer, multimedia, Demonstration, Verbal cues, and Handouts Education comprehension: verbalized understanding, returned demonstration, and verbal cues required  HOME EXERCISE PROGRAM: Access Code: [email or web address] URL: [email or web address] Date: 03/16/2023 Prepared by: Narda Amber  Exercises - Supine Lower Trunk Rotation  - 1-2 x daily - 7 x weekly - 3 reps - 20 seconds hold - Hooklying Single Knee to Chest Stretch  - 1-2 x daily - 7 x weekly - 3 reps - 20 seconds hold - Supine Figure 4 Piriformis Stretch  - 1-2 x daily - 7 x weekly - 3 reps - 20 seconds hold -  Supine Bridge  - 1-2 x daily - 7 x weekly - 10 reps - 5 seconds it ban hold - Supine ITB Stretch with Strap  - 1-2 x daily - 7 x weekly - 3 reps - 20 seconds hold - Standing Hip Flexor Stretch  - 1-2 x daily - 7 x weekly - 3 reps - 20 seconds hold  ASSESSMENT:  CLINICAL IMPRESSION: Pt still reporting left sided low back pain. Pt arriving with abdominal compression brace which he believes is helping. Pt tolerating exercises well with instructions in core activation during all exercises. Recommending continued skilled PT interventions to maximize pt's function.    OBJECTIVE IMPAIRMENTS: decreased balance, decreased mobility, difficulty walking, decreased ROM, decreased strength, impaired flexibility, and pain.   ACTIVITY LIMITATIONS: bending, sitting, standing, squatting, and sleeping  PARTICIPATION LIMITATIONS: community activity and occupation  PERSONAL FACTORS: 3+ comorbidities: see above pertinent history  are also affecting patient's functional outcome.   REHAB POTENTIAL: Good  CLINICAL DECISION MAKING: Stable/uncomplicated  EVALUATION COMPLEXITY: Low   GOALS: Goals reviewed with patient? Yes  SHORT TERM GOALS: (target date for Short term goals are 3 weeks 04/10/23)  1. Patient will demonstrate independent use of home exercise program to maintain progress from in clinic treatments.  Goal status: MET 03/30/23  LONG TERM GOALS: (target dates for all long term goals are 10 weeks  05/29/23 )   1. Patient will demonstrate/report pain at worst less than or equal to 2/10 to facilitate minimal limitation in daily activity secondary to pain symptoms.  Goal status: On-going 04/06/23  2. Patient will demonstrate independent use of home exercise program to facilitate ability to maintain/progress functional gains from skilled physical therapy services.  Goal status: On-going 04/06/23   3. Patient will demonstrate FOTO outcome > or = 66 % to indicate reduced disability due to  condition.  Goal status: New   4. Patient will demonstrate lumbar extension 100 % WFL s symptoms to facilitate upright standing, walking posture at PLOF s limitation.  Goal status: New   5.  Pt will be able to navigate 1 flight of stairs with no rail with reciprocal gait pattern.  Goal status: New   6.  Pt will be able to pick 20# from floor  and place it at counter height with no pain using correct body mechanics.  Goal status: New     PLAN:  PT FREQUENCY: 1-2x/week  PT DURATION: 12 weeks  PLANNED INTERVENTIONS: Therapeutic exercises, Therapeutic activity, Neuro Muscular re-education, Balance training, Gait training, Patient/Family education, Joint mobilization, Stair training, DME instructions, Dry Needling, Electrical stimulation, Cryotherapy, vasopneumatic device, Moist heat, Taping, Traction Ultrasound, Ionotophoresis 4mg /ml Dexamethasone, and aquatic therapy, Manual therapy.  All included unless contraindicated  PLAN FOR NEXT SESSION:  manual/mobs, core strengthening, lumbar and thoracic stretching, DN as needed  Sharmon Leyden, PT, MPT 05/25/2023, 1:10 PM  Referring diagnosis?  M54.16 (ICD-10-CM) - Lumbar radiculopathy M54.42,M54.41,G89.29 (ICD-10-CM) - Chronic bilateral low back pain with bilateral sciatica Treatment diagnosis? (if different than referring diagnosis) M54.16, M54.42, M62.81, R26.2 What was this (referring dx) caused by? []  Surgery []  Fall [x]  Ongoing issue []  Arthritis []  Other: ____________  Laterality: []  Rt []  Lt [x]  Both  Check all possible CPT codes:  *CHOOSE 10 OR LESS*    [x]  97110 (Therapeutic Exercise)  []  92507 (SLP Treatment)  [x]  97112 (Neuro Re-ed)   []  92526 (Swallowing Treatment)   [x]  97116 (Gait Training)   []  [id number] (Cognitive Training, 1st 15 minutes) [x]  97140 (Manual Therapy)   []  97130 (Cognitive Training, each add'l 15 minutes)  []  97164 (Re-evaluation)                              []  Other, List CPT Code ____________   [x]  97530 (Therapeutic Activities)     [x]  97535 (Self Care)   []  All codes above (97110 - 97535)  [x]  97012 (Mechanical Traction)  [x]  84696 (E-stim Unattended)  []  97032 (E-stim manual)  []  97033 (Ionto)  []  97035 (Ultrasound) []  97750 (Physical Performance Training) []  [id number] (Aquatic Therapy) []  97016 (Vasopneumatic Device) []  [id number] (Paraffin) []  97034 (Contrast Bath) []  97597 (Wound Care 1st 20 sq cm) []  97598 (Wound Care each add'l 20 sq cm) []  97760 (Orthotic Fabrication, Fitting, Training Initial) []  [id number] (Prosthetic Management and Training Initial) []  [id number] (Orthotic or Prosthetic Training/ Modification Subsequent)

## 2023-06-01 ENCOUNTER — Encounter: Payer: Self-pay | Admitting: Physical Therapy

## 2023-06-01 ENCOUNTER — Ambulatory Visit: Payer: Medicare HMO | Admitting: Physical Therapy

## 2023-06-01 DIAGNOSIS — M5442 Lumbago with sciatica, left side: Secondary | ICD-10-CM | POA: Diagnosis not present

## 2023-06-01 DIAGNOSIS — M6281 Muscle weakness (generalized): Secondary | ICD-10-CM

## 2023-06-01 DIAGNOSIS — G8929 Other chronic pain: Secondary | ICD-10-CM

## 2023-06-01 DIAGNOSIS — R262 Difficulty in walking, not elsewhere classified: Secondary | ICD-10-CM

## 2023-06-01 DIAGNOSIS — M5416 Radiculopathy, lumbar region: Secondary | ICD-10-CM | POA: Diagnosis not present

## 2023-06-01 DIAGNOSIS — M5441 Lumbago with sciatica, right side: Secondary | ICD-10-CM

## 2023-06-01 NOTE — Therapy (Addendum)
OUTPATIENT PHYSICAL THERAPY / DISCHARGE   Patient Name: Michael [name] MRN: [id number] DOB:[birth year], 67 y.o., male Today's Date: 06/01/2023  END OF SESSION:  PT End of Session - 06/01/23 1144     Visit Number 8    Number of Visits 12    Date for PT Re-Evaluation 06/12/23    Authorization Type [employer]:    PT Start Time 1148    PT Stop Time 1230    PT Time Calculation (min) 42 min    Activity Tolerance Patient tolerated treatment well    Behavior During Therapy [facility] for tasks assessed/performed               Past Medical History:  Diagnosis Date   Anxiety    Childhood asthma    DVT (deep venous thrombosis) (HCC) 07/2015; 10/01/2015   RLE; RLE   GERD (gastroesophageal reflux disease)    Hepatitis B    HIV (human immunodeficiency virus infection) (HCC)    HTN (hypertension)    Hyperlipidemia    Pneumonia ~ 2000   Pulmonary embolism (HCC) 12/2013   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   Past Surgical History:  Procedure Laterality Date   ANTERIOR CERVICAL DECOMP/DISCECTOMY FUSION  2010   C40-C5   APPENDECTOMY     BACK SURGERY     COLONOSCOPY     SHOULDER ARTHROSCOPY W/ ROTATOR CUFF REPAIR Bilateral 2000's   "twice on right; once on the left"   SHOULDER ARTHROSCOPY WITH SUBACROMIAL DECOMPRESSION Right 12/15/2013   Procedure: RIGHT SHOULDER ARTHROSCOPY WITH EXTENSIVE DEBRIDEMENT OF ROTATOR CUFF,SUBACROMIAL DECOMPRESSION, DEBRIDMENT OF BURSA;  Surgeon: Loreta Ave, MD;  Location: [facility];  Service: Orthopedics;  Laterality: Right;   TONSILLECTOMY     Patient Active Problem List   Diagnosis Date Noted   Acute deep vein thrombosis (DVT) of right lower extremity (HCC) 10/01/2015   Sinusitis 10/01/2015   Hypokalemia 10/01/2015   DVT (deep venous thrombosis) (HCC) 10/01/2015   Encounter for therapeutic drug monitoring 08/03/2015   Essential hypertension 07/29/2015   Hyperlipidemia 07/29/2015   Pneumonia 02/01/2014   HIV disease (HCC)  02/01/2014   Acute pulmonary embolism (HCC) 02/01/2014   Pulmonary embolism (HCC) 01/31/2014   Impingement syndrome of right shoulder 12/15/2013   Sacroiliac joint pain 04/12/2013   Pain in joint, lower leg 02/24/2013   Pain in left hip 02/24/2013   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 02/24/2013   Abnormal EKG 07/07/2011   Preop cardiovascular exam 07/07/2011   ABDOMINAL PAIN, EPIGASTRIC 03/09/2009   UNSPECIFIED LABYRINTHITIS 01/24/2009   FOOT PAIN, LEFT 11/03/2008   MORTON'S NEUROMA, 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   PLANTAR FASCIITIS, BILATERAL 09/26/2008   Hepatitis B virus infection 10/18/2007   VENEREAL WART 10/18/2007   LIVER HEMANGIOMA 10/18/2007   HYPERLIPIDEMIA 10/18/2007   DEPRESSION 10/18/2007   HAY FEVER 10/18/2007   PNEUMONIA 10/18/2007   ASTHMA, CHILDHOOD 10/18/2007   GASTROESOPHAGEAL REFLUX DISEASE 10/18/2007   Other acquired absence of organ 10/18/2007   ROTATOR CUFF REPAIR, RIGHT, HX OF 10/18/2007   HIV DISEASE 08/05/2007    PCP: Dois Davenport, MD   REFERRING PROVIDER: Juanda Chance, NP   REFERRING DIAG: M54.16 (ICD-10-CM) - Lumbar radiculopathy M54.42,M54.41,G89.29 (ICD-10-CM) - Chronic bilateral low back pain with bilateral sciatica  Rationale for Evaluation and Treatment: Rehabilitation  THERAPY DIAG:  Lumbar radiculopathy  Chronic bilateral low back pain with bilateral sciatica  Muscle weakness (generalized)  Difficulty in walking, not elsewhere classified  ONSET DATE: about a year ago  SUBJECTIVE:                                                                                                                                                                                           SUBJECTIVE STATEMENT:  Pt still reporting mild LBP at times depending on pt's movements. Pt reporting he had a good report from the surgeon which stated that no surgery was required and there was no evidence of hematoma. Pt stating that the surgeon stated it was just a  protrusion on his abdominal wall and he needed to continue to work on strengthening.   PERTINENT HISTORY:  pulmonary embolisms, anxiety, DVT, asthma, HTN, pneumonia, GERD, HIV  PAIN:  NPRS scale: 4-5/10 Rt sided low back  Pain description: achy, burning, can be stabbing Aggravating factors: sitting Relieving factors: walking  PRECAUTIONS for DN: blood thinner: Eliquis, HIV   WEIGHT BEARING RESTRICTIONS: No  FALLS:  Has patient fallen in last 6 months? No  LIVING ENVIRONMENT: Lives with: lives with their spouse Lives in: House/apartment Stairs: Yes: Internal: 1 flight  steps; on left going up Has following equipment at home: None  OCCUPATION: retired, Scientist, research (physical sciences)  PLOF: Independent  PATIENT GOALS: stop hurting, get back to working out without pain  Next MD Visit: none made a present time   OBJECTIVE:   DIAGNOSTIC FINDINGS:  FINDINGS: There is no evidence of lumbar spine fracture. Alignment is normal. Disc spaces are maintained. Degenerative endplate osteophytes are again seen at L4-L5 and L5-S1 similar to prior examination. There has been progression of degenerative facet change at L5-S1. Soft tissues demonstrate atherosclerotic calcifications in the aorta.   IMPRESSION: 1. Progressive degenerative changes of facet joints at L5-S1. 2. No evidence for fracture or malalignment.  PATIENT SURVEYS:  03/16/23: FOTO eval:     56%   SCREENING FOR RED FLAGS: Bowel or bladder incontinence: No Cauda equina syndrome: No  COGNITION: Overall cognitive status: WFL normal      SENSATION: WFL  MUSCLE LENGTH: 03/16/23 Hamstrings: Right 70 deg; Left 68 deg   POSTURE:  rounded shoulders and forward head  PALPATION: 03/16/23: TTP: lumbar paraspinals  LUMBAR ROM:   AROM Eval 03/16/23 04/27/23 05/18/23  Flexion 30 c pain 50 60  Extension 5 c pain    Right lateral flexion     Left lateral flexion     Right rotation Limited 50% due to pain Limited 25%  Limited due to Rt flank pain  Left rotation Limited 50% due to pain Limited 25%  Limited due to Rt flank pain   (Blank rows = not tested)  LOWER EXTREMITY MMT:  MMT  Right 03/16/23 Left 03/16/23  Hip flexion 5 4  Hip extension    Hip abduction 5 4  Hip adduction 5 4  Hip internal rotation    Hip external rotation    Knee flexion 5 4  Knee extension 5 5  Ankle dorsiflexion    Ankle plantarflexion    Ankle inversion    Ankle eversion     (Blank rows = not tested)  LOWER EXTREMITY ROM:    ROM Right 03/16/23 Left 03/16/23  Hip flexion 95 c pain in groin 90 c tightness in groin  Hip extension    Hip abduction    Hip adduction    Hip internal rotation    Hip external rotation    Knee flexion    Knee extension    Ankle dorsiflexion    Ankle plantarflexion    Ankle inversion    Ankle eversion     (Blank rows = not tested)  LUMBAR SPECIAL TESTS:       03/16/23: Straight leg raise test: Positive  FUNCTIONAL TESTS:        03/16/23:  5 times sit to stand: 10 seconds no UE support  GAIT: Distance walked: 30 feet level surface Assistive device utilized: None Level of assistance: Complete Independence Comments: WFL                                                                                                                                                                                                                  TODAY'S TREATMENT:                                                                                                         DATE:  06/01/23:  Therex:  Nustep: level 6 UE/LE x 10 minutes Leg Press: 100# 2 x15 bil LE Single leg Dead lift x 10 each LE, attempted with 5# weight BATCA: rows: 20# 2 x 15 BATCA: wood chops: 2 x 10 bil  Walking forward and back holding cables c 5# in each UE x 10 Supine PPT: x 15 holding  5 seconds with BP bladder under pt's lumbar curvature to help give biofeedback to pt during abdominal contraction      05/25/23:  Therex:   Bike: level 4 x 5 minutes Leg Press: 100# 2 x15 bil LE Supine dead bug: x 20 Supine single leg bridge x 10 each LE Side lying: hip abd with UE shoulder abd x 15 bil Quadraped: bird dogs: x 15  Manual:  STM to lumbar paraspinals and left QL using massage cream    05/18/23:  TherEx:  Seated marching with abdominal activation x 20 Supine marching with abdominal activation Single leg bridge x 10 bil LE Gentle abdominal crunches with shoulders flexed in multiple planes x 5 each Abdominal isometrics in supine and sitting Manual:  STM to Rt QL and lumbar and thoracic paraspinals (using massage cream and Biofreeze) Performed in seated position Ace wrapped pt's abdominals to help support abdominal bulge to improve pt's comfort        PATIENT EDUCATION:  Education details: HEP, POC Person educated: Patient Education method: Programmer, multimedia, Demonstration, Verbal cues, and Handouts Education comprehension: verbalized understanding, returned demonstration, and verbal cues required  HOME EXERCISE PROGRAM: Access Code: [email or web address] URL: [email or web address] Date: 03/16/2023 Prepared by: Narda Amber  Exercises - Supine Lower Trunk Rotation  - 1-2 x daily - 7 x weekly - 3 reps - 20 seconds hold - Hooklying Single Knee to Chest Stretch  - 1-2 x daily - 7 x weekly - 3 reps - 20 seconds hold - Supine Figure 4 Piriformis Stretch  - 1-2 x daily - 7 x weekly - 3 reps - 20 seconds hold - Supine Bridge  - 1-2 x daily - 7 x weekly - 10 reps - 5 seconds it ban hold - Supine ITB Stretch with Strap  - 1-2 x daily - 7 x weekly - 3 reps - 20 seconds hold - Standing Hip Flexor Stretch  - 1-2 x daily - 7 x weekly - 3 reps - 20 seconds hold  ASSESSMENT:  CLINICAL IMPRESSION: Pt arriving to therapy reporting no surgery needed for his abdominal protrusion. Pt reporting the surgeon recommended continued skilled PT for core strengthening. Pt tolerating all exercises well today with gradual  strength progression. Pt encouraged to perform abdominal isometrics throughout each day. Recommended continued skilled PT interventions.   OBJECTIVE IMPAIRMENTS: decreased balance, decreased mobility, difficulty walking, decreased ROM, decreased strength, impaired flexibility, and pain.   ACTIVITY LIMITATIONS: bending, sitting, standing, squatting, and sleeping  PARTICIPATION LIMITATIONS: community activity and occupation  PERSONAL FACTORS: 3+ comorbidities: see above pertinent history  are also affecting patient's functional outcome.   REHAB POTENTIAL: Good  CLINICAL DECISION MAKING: Stable/uncomplicated  EVALUATION COMPLEXITY: Low   GOALS: Goals reviewed with patient? Yes  SHORT TERM GOALS: (target date for Short term goals are 3 weeks 04/10/23)  1. Patient will demonstrate independent use of home exercise program to maintain progress from in clinic treatments.  Goal status: MET 03/30/23  LONG TERM GOALS: (target dates for all long term goals are 10 weeks  05/29/23 )   1. Patient will demonstrate/report pain at worst less than or equal to 2/10 to facilitate minimal limitation in daily activity secondary to pain symptoms.  Goal status: On-going 04/06/23   2. Patient will demonstrate independent use of home exercise program to facilitate ability to maintain/progress functional gains from skilled physical therapy services.  Goal status: On-going 04/06/23   3. Patient will demonstrate FOTO outcome > or = 66 % to indicate reduced disability due  to condition.  Goal status: New   4. Patient will demonstrate lumbar extension 100 % WFL s symptoms to facilitate upright standing, walking posture at PLOF s limitation.  Goal status: New   5.  Pt will be able to navigate 1 flight of stairs with no rail with reciprocal gait pattern.  Goal status: New   6.  Pt will be able to pick 20# from floor  and place it at counter height with no pain using correct body mechanics.  Goal status: New      PLAN:  PT FREQUENCY: 1-2x/week  PT DURATION: 12 weeks  PLANNED INTERVENTIONS: Therapeutic exercises, Therapeutic activity, Neuro Muscular re-education, Balance training, Gait training, Patient/Family education, Joint mobilization, Stair training, DME instructions, Dry Needling, Electrical stimulation, Cryotherapy, vasopneumatic device, Moist heat, Taping, Traction Ultrasound, Ionotophoresis 4mg /ml Dexamethasone, and aquatic therapy, Manual therapy.  All included unless contraindicated  PLAN FOR NEXT SESSION:  manual/mobs, core strengthening, lumbar and thoracic stretching, DN as needed  Sharmon Leyden, PT, MPT 06/01/2023, 12:46 PM  PHYSICAL THERAPY DISCHARGE SUMMARY  Visits from Start of Care: 8  Current functional level related to goals / functional outcomes: See note   Remaining deficits: See note   Education / Equipment: HEP  Patient goals were partially met. Patient is being discharged due to not returning since the last visit.  Chyrel Masson, PT, DPT, OCS, ATC 07/02/23  2:10 PM

## 2023-06-09 ENCOUNTER — Encounter: Payer: Medicare HMO | Admitting: Physical Therapy

## 2023-06-16 ENCOUNTER — Ambulatory Visit: Payer: Medicare HMO | Admitting: Orthopaedic Surgery

## 2023-07-14 ENCOUNTER — Telehealth: Payer: Self-pay | Admitting: Physical Medicine and Rehabilitation

## 2023-07-14 ENCOUNTER — Encounter: Payer: Medicare HMO | Admitting: Physical Therapy

## 2023-07-14 NOTE — Telephone Encounter (Signed)
Pt called requesting a call to set an appt. Pt need a new referral for physical therapy. Pt phone number is [phone].

## 2023-07-15 NOTE — Telephone Encounter (Signed)
LVM to return call to schedule OV 

## 2023-07-21 ENCOUNTER — Ambulatory Visit: Payer: Medicare HMO | Admitting: Orthopaedic Surgery

## 2023-07-21 ENCOUNTER — Encounter: Payer: Medicare HMO | Admitting: Physical Therapy

## 2023-07-28 ENCOUNTER — Encounter: Payer: Medicare HMO | Admitting: Physical Therapy

## 2023-07-31 ENCOUNTER — Other Ambulatory Visit: Payer: Self-pay | Admitting: Physical Medicine and Rehabilitation

## 2023-07-31 ENCOUNTER — Telehealth: Payer: Self-pay

## 2023-07-31 MED ORDER — PREDNISONE 50 MG PO TABS: 50.0000 mg | ORAL_TABLET | Freq: Every day | ORAL | 0 refills | Status: DC

## 2023-07-31 NOTE — Telephone Encounter (Signed)
Patient called stating he did something to his back in the gym and since it is the weekend asking if may have a dose pack called in for him? States it is so painful to bend from the waist down

## 2023-08-04 ENCOUNTER — Encounter: Payer: Medicare HMO | Admitting: Physical Therapy

## 2023-12-17 ENCOUNTER — Other Ambulatory Visit: Payer: Self-pay

## 2023-12-17 MED ORDER — ATORVASTATIN CALCIUM 10 MG PO TABS: 10.0000 mg | ORAL_TABLET | Freq: Every day | ORAL | 0 refills | Status: DC

## 2023-12-21 ENCOUNTER — Telehealth: Payer: Self-pay | Admitting: Cardiovascular Disease

## 2023-12-21 MED ORDER — ATORVASTATIN CALCIUM 10 MG PO TABS: 10.0000 mg | ORAL_TABLET | Freq: Every day | ORAL | 0 refills | Status: DC

## 2023-12-21 NOTE — Addendum Note (Signed)
 Addended by: Margaret Pyle D on: 12/21/2023 12:07 PM   Modules accepted: Orders

## 2023-12-21 NOTE — Telephone Encounter (Signed)
*  STAT* If patient is at the pharmacy, call can be transferred to refill team.   1. Which medications need to be refilled? (please list name of each medication and dose if known)   atorvastatin (LIPITOR) 10 MG tablet   2. Would you like to learn more about the convenience, safety, & potential cost savings by using the [facility]?   3. Are you open to using the [facility] (Type [facility]. ).  4. Which pharmacy/location (including street and city if local pharmacy) is medication to be sent to?  [id number] - [place], [place] - [address] AT [facility] OF [address]    5. Do they need a 30 day or 90 day supply?   Patient stated he only has 4 tablets left.  Patient has appointment scheduled on 3/24.

## 2023-12-21 NOTE — Telephone Encounter (Signed)
 Pt's medication was sent to pt's pharmacy as requested. Confirmation received.

## 2023-12-25 NOTE — Progress Notes (Signed)
 Cardiology Office Note:  .   Date:  12/28/2023  ID:  Michael [name], DOB 1956-07-29, MRN [id number] PCP: Dois Davenport, MD  [facility] Providers Cardiologist:  Verne Carrow, MD {  History of Present Illness: .   Michael [name] is a 68 y.o. male with a history of hypertension, hyperlipidemia, HLD, GERD, prior DVT/PE, hepatitis B who is here for follow-up appointment.  Was seen in the office back in 2012 for preoperative assessment and again in 2021 provide establish cardiac care.  No prior cardiac history.  Echo in 2016 with LVEF 65%.  Mildly calcified aortic valve leaflets with no evidence of aortic stenosis.  DVT/PE in 2016 and has been on Eliquis since then.  Abdominal percent 07/02/2020 with no evidence of AAA.  Echo December 2021 with LVEF 60-65%.  Aortic valve sclerosis.  Carotid artery Dopplers November 2021 with no evidence of carotid artery disease.  Was last seen February 2024 for follow-up.  Patient denies any chest pain, dyspnea, palpitations, lower extremity edema, orthopnea, PND, dizziness, syncope or near syncope.  Today, he presents, with a history of heart disease,  for a routine follow-up. He reports doing well with no issues of swelling or chest pains since the last visit a year ago. The patient is on cardiac medications and reports no issues with them. He mentions running out of refills for Vascepa, a medication for elevated triglycerides. The patient had an ultrasound of the heart last year to check the aortic valve, which showed no abnormalities. The patient is physically active, working out three times a week with a trainer. He also discusses his diet, mentioning a struggle with incorporating green vegetables and a tendency towards foods with flavor.   Reports no shortness of breath nor dyspnea on exertion. Reports no chest pain, pressure, or tightness. No edema, orthopnea, PND. Reports no palpitations.   Discussed the use of AI scribe software for clinical  note transcription with the patient, who gave verbal consent to proceed.   ROS: pertinent ROS in HPI  Studies Reviewed: Marland Kitchen   EKG Interpretation Date/Time:  Monday December 28 2023 08:17:28 EDT Ventricular Rate:  65 PR Interval:  166 QRS Duration:  94 QT Interval:  394 QTC Calculation: 409 R Axis:   44  Text Interpretation: Normal sinus rhythm Low voltage QRS Cannot rule out Anterior infarct (cited on or before 31-Jan-2014) When compared with ECG of 31-Jan-2014 19:07, QRS axis Shifted left Questionable change in initial forces of Lateral leads Nonspecific T wave abnormality no longer evident in Anterior leads QT has shortened Confirmed by Jari Favre [phone]) on 12/28/2023 8:22:36 AM   Echo 12/17/22 IMPRESSIONS     1. Left ventricular ejection fraction, by estimation, is 55 to 60%. The  left ventricle has normal function. The left ventricle has no regional  wall motion abnormalities. Left ventricular diastolic parameters are  consistent with Grade I diastolic  dysfunction (impaired relaxation).   2. Right ventricular systolic function is normal. The right ventricular  size is normal.   3. The mitral valve is normal in structure. Trivial mitral valve  regurgitation. No evidence of mitral stenosis.   4. The aortic valve is tricuspid. Aortic valve regurgitation is not  visualized. No aortic stenosis is present.   5. The inferior vena cava is normal in size with greater than 50%  respiratory variability, suggesting right atrial pressure of 3 mmHg.   FINDINGS   Left Ventricle: Left ventricular ejection fraction, by estimation, is 55  to  60%. The left ventricle has normal function. The left ventricle has no  regional wall motion abnormalities. The left ventricular internal cavity  size was normal in size. There is   no left ventricular hypertrophy. Left ventricular diastolic parameters  are consistent with Grade I diastolic dysfunction (impaired relaxation).  Indeterminate filling  pressures.   Right Ventricle: The right ventricular size is normal. No increase in  right ventricular wall thickness. Right ventricular systolic function is  normal.   Left Atrium: Left atrial size was normal in size.   Right Atrium: Right atrial size was normal in size.   Pericardium: There is no evidence of pericardial effusion.   Mitral Valve: The mitral valve is normal in structure. Trivial mitral  valve regurgitation. No evidence of mitral valve stenosis.   Tricuspid Valve: The tricuspid valve is normal in structure. Tricuspid  valve regurgitation is not demonstrated. No evidence of tricuspid  stenosis.   Aortic Valve: The aortic valve is tricuspid. Aortic valve regurgitation is  not visualized. No aortic stenosis is present.   Pulmonic Valve: The pulmonic valve was normal in structure. Pulmonic valve  regurgitation is trivial. No evidence of pulmonic stenosis.   Aorta: The aortic root is normal in size and structure.   Venous: The inferior vena cava is normal in size with greater than 50%  respiratory variability, suggesting right atrial pressure of 3 mmHg.   IAS/Shunts: No atrial level shunt detected by color flow Doppler.       Physical Exam:   VS:  BP 130/72   Pulse 65   Ht 5\' 6"  (1.676 m)   Wt 182 lb 9.6 oz (82.8 kg)   SpO2 99%   BMI 29.47 kg/m    Wt Readings from Last 3 Encounters:  12/28/23 182 lb 9.6 oz (82.8 kg)  04/13/23 187 lb 11.2 oz (85.1 kg)  11/20/22 187 lb 9.6 oz (85.1 kg)    GEN: Well nourished, well developed in no acute distress NECK: No JVD; No carotid bruits CARDIAC: RRR, no murmurs, rubs, gallops RESPIRATORY:  Clear to auscultation without rales, wheezing or rhonchi  ABDOMEN: Soft, non-tender, non-distended EXTREMITIES:  No edema; No deformity   ASSESSMENT AND PLAN: .   Hypertriglyceridemia Chronic hypertriglyceridemia with familial component. Triglycerides reduced with Vascepa but remain elevated. Discussed diet and exercise  importance. - Refill Vascepa prescription. - Encourage dietary efforts, increase green vegetables and smoothies.  Medication management On atorvastatin and Eliquis, no issues reported. Out of Vascepa refills. Discussed importance of lab monitoring. - Refill Vascepa prescription. - Continue atorvastatin and Eliquis. - Request recent lab results from primary care.  Aortic valve monitoring Previous echocardiogram normal, asymptomatic. Discussed symptom monitoring and echocardiogram schedule. - Monitor for symptoms: lightheadedness, dizziness, chest pain, shortness of breath. - Echocardiogram every few years unless symptoms develop.  Exercise and lifestyle Engaged in fitness routine, challenges with allergies. Discussed exercise benefits. - Encourage current exercise routine. - Advise on allergy management for outdoor activities.      Dispo: He can return in a year with Dr. Clifton James  Signed, Sharlene Dory, PA-C

## 2023-12-28 ENCOUNTER — Ambulatory Visit: Attending: Physician Assistant | Admitting: Physician Assistant

## 2023-12-28 ENCOUNTER — Encounter: Payer: Self-pay | Admitting: Physician Assistant

## 2023-12-28 VITALS — BP 130/72 | HR 65 | Ht 66.0 in | Wt 182.6 lb

## 2023-12-28 DIAGNOSIS — R011 Cardiac murmur, unspecified: Secondary | ICD-10-CM | POA: Diagnosis not present

## 2023-12-28 DIAGNOSIS — I358 Other nonrheumatic aortic valve disorders: Secondary | ICD-10-CM | POA: Diagnosis not present

## 2023-12-28 DIAGNOSIS — I10 Essential (primary) hypertension: Secondary | ICD-10-CM | POA: Diagnosis not present

## 2023-12-28 DIAGNOSIS — I82409 Acute embolism and thrombosis of unspecified deep veins of unspecified lower extremity: Secondary | ICD-10-CM

## 2023-12-28 DIAGNOSIS — E7800 Pure hypercholesterolemia, unspecified: Secondary | ICD-10-CM | POA: Diagnosis not present

## 2023-12-28 MED ORDER — OLMESARTAN MEDOXOMIL-HCTZ 40-12.5 MG PO TABS: 1.0000 | ORAL_TABLET | Freq: Every day | ORAL | 3 refills | Status: AC

## 2023-12-28 MED ORDER — VASCEPA 1 G PO CAPS: 2.0000 g | ORAL_CAPSULE | Freq: Two times a day (BID) | ORAL | 3 refills | Status: DC

## 2023-12-28 MED ORDER — ATORVASTATIN CALCIUM 10 MG PO TABS: 10.0000 mg | ORAL_TABLET | Freq: Every day | ORAL | 3 refills | Status: DC

## 2023-12-28 NOTE — Patient Instructions (Signed)
 Medication Instructions:  Refilled cardiac medications *If you need a refill on your cardiac medications before your next appointment, please call your pharmacy*   Lab Work: Fax [place] labs in May If you have labs (blood work) drawn today and your tests are completely normal, you will receive your results only by: MyChart Message (if you have MyChart) OR A paper copy in the mail If you have any lab test that is abnormal or we need to change your treatment, we will call you to review the results.   Testing/Procedures: NONE   Follow-Up: At [facility], you and your health needs are our priority.  As part of our continuing mission to provide you with exceptional heart care, we have created designated Provider Care Teams.  These Care Teams include your primary Cardiologist (physician) and Advanced Practice Providers (APPs -  Physician Assistants and Nurse Practitioners) who all work together to provide you with the care you need, when you need it.  We recommend signing up for the patient portal called "MyChart".  Sign up information is provided on this After Visit Summary.  MyChart is used to connect with patients for Virtual Visits (Telemedicine).  Patients are able to view lab/test results, encounter notes, upcoming appointments, etc.  Non-urgent messages can be sent to your provider as well.   To learn more about what you can do with MyChart, go to [email or web address].    Your next appointment:   1 year(s)  Provider:   Jari Favre, PA-C  Other Instructions   1st Floor: - Lobby - Registration  - Pharmacy  - Lab - Cafe  2nd Floor: - PV Lab - Diagnostic Testing (echo, CT, nuclear med)  3rd Floor: - Vacant  4th Floor: - TCTS (cardiothoracic surgery) - AFib Clinic - Structural Heart Clinic - Vascular Surgery  - Vascular Ultrasound  5th Floor: - HeartCare Cardiology (general and EP) - Clinical Pharmacy for coumadin, hypertension, lipid, weight-loss  medications, and med management appointments    Valet parking services will be available as well.

## 2024-02-04 ENCOUNTER — Telehealth: Payer: Self-pay | Admitting: Cardiovascular Disease

## 2024-02-04 ENCOUNTER — Telehealth: Payer: Self-pay | Admitting: Pharmacy Technician

## 2024-02-04 ENCOUNTER — Other Ambulatory Visit (HOSPITAL_COMMUNITY): Payer: Self-pay

## 2024-02-04 DIAGNOSIS — I10 Essential (primary) hypertension: Secondary | ICD-10-CM

## 2024-02-04 MED ORDER — OMEGA-3-ACID ETHYL ESTERS 1 G PO CAPS: 2.0000 g | ORAL_CAPSULE | Freq: Two times a day (BID) | ORAL | 3 refills | Status: AC

## 2024-02-04 NOTE — Telephone Encounter (Signed)
 Patient called again to follow-up on his medication change.  Patient noted he has not had any medication today and wants to know if prescription will be sent to his pharmacy [id number] - [place], [place] - [address] AT [facility] OF [address] today.

## 2024-02-04 NOTE — Telephone Encounter (Signed)
 Pt c/o medication issue:  1. Name of Medication: Omega 3 Acid Ephyl Ephyl [place] 1 gram,  $117 for 100 tablets - Icosapente 1 gram  $250 for 3 months   2. How are you currently taking this medication (dosage and times per day)?   3. Are you having a reaction (difficulty breathing--STAT)?   4. What is your medication issue? Patient says his insurance will no longer pay for his Vascepa  His insurance will pay for one of the medicine listed abovve He wants to know which one Dr Abel Hoe recommends  Please let him know today, he needs it today, he is out of the medicine

## 2024-02-04 NOTE — Telephone Encounter (Signed)
 Vascepa  is not on the formulary they prefer icosapent  Ethyl 1 g capsule. Generic went through insurance 250.00 for 3 months

## 2024-02-04 NOTE — Telephone Encounter (Signed)
 Spoke with patient and he is aware Rx have been sent

## 2024-03-16 ENCOUNTER — Other Ambulatory Visit: Payer: Self-pay | Admitting: Cardiovascular Disease

## 2024-03-18 ENCOUNTER — Telehealth: Payer: Self-pay | Admitting: Cardiovascular Disease

## 2024-03-18 MED ORDER — ATORVASTATIN CALCIUM 10 MG PO TABS: 10.0000 mg | ORAL_TABLET | Freq: Every day | ORAL | 2 refills | Status: AC

## 2024-03-18 NOTE — Telephone Encounter (Signed)
 Pt's medication was sent to pt's pharmacy as requested. Confirmation received.

## 2024-03-18 NOTE — Telephone Encounter (Signed)
*  STAT* If patient is at the pharmacy, call can be transferred to refill team.   1. Which medications need to be refilled? (please list name of each medication and dose if known)   atorvastatin  (LIPITOR) 10 MG tablet      4. Which pharmacy/location (including street and city if local pharmacy) is medication to be sent to?  [id number] - [place], [place] - [address] AT [facility] OF [address]     5. Do they need a 30 day or 90 day supply? 90

## 2024-03-25 ENCOUNTER — Telehealth: Payer: Self-pay | Admitting: *Deleted

## 2024-03-25 MED ORDER — ELIQUIS 5 MG PO TABS: 5.0000 mg | ORAL_TABLET | Freq: Two times a day (BID) | ORAL | 3 refills | Status: DC

## 2024-03-25 NOTE — Telephone Encounter (Addendum)
 Reports his Eliquis 5 mg bid script has run out (prescribed by [facility]). He established care w/Dr. Scherrie Curt in July 2024. Needs new script sent to [facility] on [address].  Next appointment is 04/13/2024- per Dr. Scherrie Curt, OK to refill

## 2024-04-11 ENCOUNTER — Telehealth: Payer: Self-pay | Admitting: Oncology

## 2024-04-11 NOTE — Telephone Encounter (Signed)
 Called with questions about yearly appt. Confirmed that it has been a year since his last appt. Confirmed date and time.

## 2024-04-13 ENCOUNTER — Inpatient Hospital Stay: Payer: Medicare HMO | Admitting: Oncology

## 2024-04-14 ENCOUNTER — Telehealth: Payer: Self-pay | Admitting: *Deleted

## 2024-04-14 NOTE — Telephone Encounter (Addendum)
 Per pharmacy:eliquis  with fluconazole can incr the effects of eliquis , so I would avoid them. No interaction with eliquis  and terbinafin came up. Notified patient. He reports he only took the fluconazole x 1 dose. Appreciates the information.

## 2024-04-14 NOTE — Telephone Encounter (Signed)
 Has adopted kitten from shelter and now has ringworm. Was started on fluconazole 100 mg every other day and MD is considering Terbinafine 250 mg every day X 30 days since it has not been responding. Also put on 7 days of doxycycline because one lesion looks infected. He is asking if there is any issue with these drugs and his Eliquis . Oncology pharmacist has been asked to research this.

## 2024-04-18 ENCOUNTER — Telehealth: Payer: Self-pay | Admitting: Oncology

## 2024-04-18 ENCOUNTER — Encounter: Payer: Self-pay | Admitting: *Deleted

## 2024-04-21 ENCOUNTER — Inpatient Hospital Stay: Admitting: Oncology

## 2024-05-05 ENCOUNTER — Telehealth: Payer: Self-pay | Admitting: Cardiovascular Disease

## 2024-05-05 NOTE — Telephone Encounter (Signed)
*  STAT* If patient is at the pharmacy, call can be transferred to refill team.   1. Which medications need to be refilled? (please list name of each medication and dose if known) omega-3 acid ethyl esters (LOVAZA ) 1 g capsule    2. Would you like to learn more about the convenience, safety, & potential cost savings by using the [facility]?    3. Are you open to using the [facility] (Type [facility]. ).   4. Which pharmacy/location (including street and city if local pharmacy) is medication to be sent to? [id number] - [place], [place] - [address] AT [facility] OF [address]    5. Do they need a 30 day or 90 day supply? 90 day

## 2024-05-19 ENCOUNTER — Inpatient Hospital Stay: Attending: Oncology | Admitting: Oncology

## 2024-05-19 ENCOUNTER — Telehealth: Payer: Self-pay | Admitting: Oncology

## 2024-05-19 VITALS — BP 121/66 | HR 74 | Temp 97.9°F | Resp 18 | Ht 66.0 in | Wt 173.8 lb

## 2024-05-19 DIAGNOSIS — Z86711 Personal history of pulmonary embolism: Secondary | ICD-10-CM | POA: Diagnosis present

## 2024-05-19 DIAGNOSIS — Z86718 Personal history of other venous thrombosis and embolism: Secondary | ICD-10-CM | POA: Insufficient documentation

## 2024-05-19 DIAGNOSIS — Z7901 Long term (current) use of anticoagulants: Secondary | ICD-10-CM | POA: Insufficient documentation

## 2024-05-19 DIAGNOSIS — I2699 Other pulmonary embolism without acute cor pulmonale: Secondary | ICD-10-CM

## 2024-05-19 DIAGNOSIS — D6859 Other primary thrombophilia: Secondary | ICD-10-CM | POA: Insufficient documentation

## 2024-05-19 DIAGNOSIS — N400 Enlarged prostate without lower urinary tract symptoms: Secondary | ICD-10-CM | POA: Insufficient documentation

## 2024-05-19 NOTE — Progress Notes (Signed)
  [facility] OFFICE PROGRESS NOTE   Diagnosis: Chronic anticoagulation  INTERVAL HISTORY:   Mr. Michael [name] returns as scheduled.  He continues apixaban.  No bleeding or symptom of thrombosis.  He reports improvement in urinary flow.  He was recently diagnosed with ringworm over the arms after obtaining a cat.  He was treated with medical therapy and the rash resolved. He continues follow-up at [facility] for HIV infection.  He reports the HIV level is undetectable. Objective:  Vital signs in last 24 hours:  Blood pressure 121/66, pulse 74, temperature 97.9 F (36.6 C), temperature source Temporal, resp. rate 18, height 5' 6 (1.676 m), weight 173 lb 12.8 oz (78.8 kg), SpO2 98%.    Resp: Lungs clear bilaterally, distant breath sounds Cardio: Distant heart sounds, regular rhythm GI: No hepatosplenomegaly Vascular: No leg edema   Lab Results:  Lab Results  Component Value Date   WBC 6.8 11/24/2017   HGB 16.1 11/24/2017   HCT 46.2 11/24/2017   MCV 98 (H) 11/24/2017   PLT 134 (L) 11/24/2017   NEUTROABS 4.0 11/24/2017    CMP  Lab Results  Component Value Date   NA 139 11/03/2016   K 3.3 (L) 11/03/2016   CL 101 11/03/2016   CO2 25 11/03/2016   GLUCOSE 89 11/03/2016   BUN 16 11/03/2016   CREATININE 1.30 (H) 11/03/2016   CALCIUM 9.8 11/03/2016   PROT 6.7 08/21/2021   ALBUMIN 4.6 08/21/2021   AST 35 08/21/2021   ALT 43 08/21/2021   ALKPHOS 54 08/21/2021   BILITOT 0.5 08/21/2021   GFRNONAA 57 (L) 11/03/2016   GFRAA >60 11/03/2016     Medications: I have reviewed the patient's current medications.   Assessment/Plan: Hypercoagulation syndrome- recurrent venous thrombosis Pulmonary embolism in April 2015 and saddle pulmonary embolism and right lower extremity DVT in October 2016 Both episodes of pulmonary embolism appeared provoked 10/01/2015-right lower extremity femoral DVT while on Coumadin. Anticoagulation changed from Arixtra to apixaban January 2021 HIV  infection Hypertension History of hepatitis B infection Chronic right plantar fasciitis Hyperlipidemia Chronic mild thrombocytopenia BPH     Disposition: Mr. [name] appears stable.  He is maintained on chronic anticoagulation therapy due to a history of recurrent venous thromboembolic disease.  He will continue apixaban at the current dose.  We discussed reduced intensity anticoagulation.  He is comfortable continuing apixaban at the current dose.  Mr. [name] would like to continue follow-up in the hematology clinic.  He will return for an office visit in 1 year.  Arley Hof, MD  05/19/2024  9:45 AM

## 2024-05-19 NOTE — Telephone Encounter (Signed)
 Contacted pt to schedule an appt. Unable to reach via phone, voicemail was left.    Follow-Up Information  Follow-up disposition: Return in about 1 year (around 05/19/2025) for office.  Check out comments: Office 1 year

## 2024-05-20 NOTE — Telephone Encounter (Signed)
 Patient has been scheduled. Aware of appt date and time.

## 2024-07-27 ENCOUNTER — Other Ambulatory Visit: Payer: Self-pay | Admitting: Oncology

## 2024-08-16 ENCOUNTER — Ambulatory Visit: Admitting: Orthopaedic Surgery

## 2024-08-16 ENCOUNTER — Other Ambulatory Visit (INDEPENDENT_AMBULATORY_CARE_PROVIDER_SITE_OTHER): Payer: Self-pay

## 2024-08-16 DIAGNOSIS — M1611 Unilateral primary osteoarthritis, right hip: Secondary | ICD-10-CM

## 2024-08-16 NOTE — Progress Notes (Signed)
 Office Visit Note   Patient: Michael [name]           Date of Birth: [birth year]           MRN: [id number] Visit Date: 08/16/2024              Requested by: Burney Darice CROME, MD [address] [place],  [place] [address] PCP: Burney Darice CROME, MD   Assessment & Plan: Visit Diagnoses:  1. Primary osteoarthritis of right hip     Plan: History of Present Illness Michael [name] is a 68 year old male who presents with right hip and thigh pain.  He experiences sharp right hip and thigh pain for the past three to four weeks, originating in the groin and extending across the leg. The pain is triggered by twisting or bending movements, but not by walking, sitting, or going upstairs. No previous episodes of similar pain have occurred. The pain does not radiate from the back. No knee pain is present.  Physical Exam MUSCULOSKELETAL: Hip rotation and flexion non-tender. No tenderness over trochanteric area. No knee pain. Pain on resisted hip flexion.  Assessment and Plan Right hip pain with suspected osteoarthritis Pain likely from hip joint, suggesting possible osteoarthritis. - Ordered hip x-ray to evaluate for osteoarthritis or structural abnormalities.  Follow-Up Instructions: No follow-ups on file.   Orders:  Orders Placed This Encounter  Procedures   XR HIP UNILAT W OR W/O PELVIS 2-3 VIEWS RIGHT   AMB referral to sports medicine   No orders of the defined types were placed in this encounter.     Procedures: No procedures performed   Clinical Data: No additional findings.   Subjective: Chief Complaint  Patient presents with   Right Hip - Pain   Right Thigh - Pain   Lower Back - Pain    HPI  Review of Systems  Constitutional: Negative.   HENT: Negative.    Eyes: Negative.   Respiratory: Negative.    Cardiovascular: Negative.   Gastrointestinal: Negative.   Endocrine: Negative.   Genitourinary: Negative.   Skin: Negative.   Allergic/Immunologic:  Negative.   Neurological: Negative.   Hematological: Negative.   Psychiatric/Behavioral: Negative.    All other systems reviewed and are negative.    Objective: Vital Signs: There were no vitals taken for this visit.  Physical Exam Vitals and nursing note reviewed.  Constitutional:      Appearance: He is well-developed.  HENT:     Head: Normocephalic and atraumatic.  Eyes:     Pupils: Pupils are equal, round, and reactive to light.  Pulmonary:     Effort: Pulmonary effort is normal.  Abdominal:     Palpations: Abdomen is soft.  Musculoskeletal:        General: Normal range of motion.     Cervical back: Neck supple.  Skin:    General: Skin is warm.  Neurological:     Mental Status: He is alert and oriented to person, place, and time.  Psychiatric:        Behavior: Behavior normal.        Thought Content: Thought content normal.        Judgment: Judgment normal.     Ortho Exam  Specialty Comments:  No specialty comments available.  Imaging: XR HIP UNILAT W OR W/O PELVIS 2-3 VIEWS RIGHT Result Date: 08/16/2024 X-rays of the right hip show arthritic changes including periarticular spurring and subchondral cystic formation.  Joint space is  slightly narrowed.    PMFS History: Patient Active Problem List   Diagnosis Date Noted   Acute deep vein thrombosis (DVT) of right lower extremity (HCC) 10/01/2015   Sinusitis 10/01/2015   Hypokalemia 10/01/2015   DVT (deep venous thrombosis) (HCC) 10/01/2015   Encounter for therapeutic drug monitoring 08/03/2015   Essential hypertension 07/29/2015   Hyperlipidemia 07/29/2015   Pneumonia 02/01/2014   HIV disease (HCC) 02/01/2014   Acute pulmonary embolism (HCC) 02/01/2014   Pulmonary embolism (HCC) 01/31/2014   Impingement syndrome of right shoulder 12/15/2013   Sacroiliac joint pain 04/12/2013   Pain in joint, lower leg 02/24/2013   Pain in left hip 02/24/2013   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 02/24/2013    Abnormal EKG 07/07/2011   Preop cardiovascular exam 07/07/2011   ABDOMINAL PAIN, EPIGASTRIC 03/09/2009   UNSPECIFIED LABYRINTHITIS 01/24/2009   FOOT PAIN, LEFT 11/03/2008   MORTON'S NEUROMA, 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   PLANTAR FASCIITIS, BILATERAL 09/26/2008   Hepatitis B virus infection 10/18/2007   VENEREAL WART 10/18/2007   LIVER HEMANGIOMA 10/18/2007   HYPERLIPIDEMIA 10/18/2007   DEPRESSION 10/18/2007   HAY FEVER 10/18/2007   PNEUMONIA 10/18/2007   ASTHMA, CHILDHOOD 10/18/2007   GASTROESOPHAGEAL REFLUX DISEASE 10/18/2007   Other acquired absence of organ 10/18/2007   ROTATOR CUFF REPAIR, RIGHT, HX OF 10/18/2007   HIV DISEASE 08/05/2007   Past Medical History:  Diagnosis Date   Anxiety    Childhood asthma    DVT (deep venous thrombosis) (HCC) 07/2015; 10/01/2015   RLE; RLE   GERD (gastroesophageal reflux disease)    Hepatitis B    HIV (human immunodeficiency virus infection) (HCC)    HTN (hypertension)    Hyperlipidemia    Pneumonia ~ 2000   Pulmonary embolism (HCC) 12/2013   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    Family History  Problem Relation Age of Onset   Diabetes Mother    Heart attack Paternal Grandfather    Deep vein thrombosis Father    Allergic rhinitis Father    Asthma Neg Hx    Eczema Neg Hx    Urticaria Neg Hx     Past Surgical History:  Procedure Laterality Date   ANTERIOR CERVICAL DECOMP/DISCECTOMY FUSION  2010   C40-C5   APPENDECTOMY     BACK SURGERY     COLONOSCOPY     SHOULDER ARTHROSCOPY W/ ROTATOR CUFF REPAIR Bilateral 2000's   twice on right; once on the left   SHOULDER ARTHROSCOPY WITH SUBACROMIAL DECOMPRESSION Right 12/15/2013   Procedure: RIGHT SHOULDER ARTHROSCOPY WITH EXTENSIVE DEBRIDEMENT OF ROTATOR CUFF,SUBACROMIAL DECOMPRESSION, DEBRIDMENT OF BURSA;  Surgeon: Toribio JULIANNA Chancy, MD;  Location: [facility];  Service: Orthopedics;  Laterality: Right;   TONSILLECTOMY     Social History   Occupational History   Occupation:  Retired-clinical research  Tobacco Use   Smoking status: Never   Smokeless tobacco: Never  Substance and Sexual Activity   Alcohol use: Yes    Alcohol/week: 0.0 standard drinks of alcohol    Comment: Occasional.   Drug use: No   Sexual activity: Yes

## 2024-08-24 ENCOUNTER — Ambulatory Visit: Admitting: Sports Medicine

## 2024-08-29 LAB — COLOGUARD: COLOGUARD: NEGATIVE

## 2024-09-20 ENCOUNTER — Other Ambulatory Visit: Payer: Self-pay

## 2024-09-20 ENCOUNTER — Ambulatory Visit: Admitting: Allergy and Immunology

## 2024-09-20 VITALS — BP 110/60 | HR 76 | Temp 98.6°F | Resp 20 | Ht 65.0 in | Wt 189.0 lb

## 2024-09-20 DIAGNOSIS — K219 Gastro-esophageal reflux disease without esophagitis: Secondary | ICD-10-CM | POA: Diagnosis not present

## 2024-09-20 DIAGNOSIS — J301 Allergic rhinitis due to pollen: Secondary | ICD-10-CM

## 2024-09-20 DIAGNOSIS — J3089 Other allergic rhinitis: Secondary | ICD-10-CM | POA: Diagnosis not present

## 2024-09-20 MED ORDER — RYALTRIS 665-25 MCG/ACT NA SUSP: 2.0000 | Freq: Two times a day (BID) | NASAL | 2 refills | Status: AC | PRN

## 2024-09-20 MED ORDER — PANTOPRAZOLE SODIUM 40 MG PO TBEC: 40.0000 mg | DELAYED_RELEASE_TABLET | Freq: Every day | ORAL | 2 refills | Status: AC

## 2024-09-20 MED ORDER — OLOPATADINE HCL 0.2 % OP SOLN: 1.0000 [drp] | Freq: Every day | OPHTHALMIC | 2 refills | Status: AC

## 2024-09-20 NOTE — Patient Instructions (Addendum)
°  1. Return to clinic for skin testing (no antihistamines)  2. Treat and prevent inflammation of airway:   A. Ryaltris  - 2 sprays each nostril 1-2 times per day  3. Treat and prevent reflux:   A. Avoid caffeine as much as possible  B. Pantoprazole  40 mg - 1 tablet 1 time per day  C. Replace throat clearing with swallowing / drinking maneuver  4. If needed:   A. Pataday  - 1 drop each eye 1 time per day  B. OTC oral antihistamine - 1-2 times per day  5. Influenza = Tamiflu. Covid = Paxlovid

## 2024-09-20 NOTE — Progress Notes (Unsigned)
  - [place]   Dear Michael,  Thank you for referring Michael [name] to the [facility] [place]  on 09/20/2024.   Below is a summation of this patient's evaluation and recommendations.  Thank you for your referral. I will keep you informed about this patient's response to treatment.   If you have any questions please do not hesitate to contact me.   Sincerely,  Michael DOROTHA Denis, MD Allergy / Immunology [facility] [place]    ______________________________________________________________________    NEW PATIENT NOTE  Referring Provider: Burney Darice CROME, MD Primary Provider: Burney Darice CROME, MD Date of office visit: 09/20/2024    Subjective:   Chief Complaint:  Michael [name] (DOB: 1955/12/17) is a 68 y.o. male who presents to the clinic on 09/20/2024 with a chief complaint of Allergic Rhinitis  (Patient states that he has been seeing his PCP for his allergies. He states that they have worsened over the years. He states that the fall really brings out his allergies. He states that he has blockage in his right nostril. The antihistamine he is taking has not been working.) .     HPI: Darivs presents to this clinic in evaluation of allergies.  I had apparently seen him in this clinic for similar issue 8 years ago.  Apparently he has had a particularly bad summer and fall with his allergies.  He describes nasal congestion and sneezing and itchy watery eyes and had an episode of sinusitis manifested as headache and just feeling terrible for which he received Augmentin .  He wakes up in the morning congested especially on his right nostril.  He also has a lot of postnasal drip and throat clearing and coughing and intermittent raspy voice.  This occurs in the context of heartburn into his mid chest region for which he uses Tums about every other day.  He has 1 caffeinated tea  per day and does not really consume any chocolate or drink alcohol.  It should be noted that he had a new cat introduced to his house in July 2025 after a hiatus of cat exposure for approximately 1 month as his 39 year old cat passed away in 04/10/24.  He has received this years flu vaccine.  Past Medical History:  Diagnosis Date   Anxiety    Childhood asthma    DVT (deep venous thrombosis) (HCC) 07/2015; 10/01/2015   RLE; RLE   GERD (gastroesophageal reflux disease)    Hepatitis B    HIV (human immunodeficiency virus infection) (HCC)    HTN (hypertension)    Hyperlipidemia    Pneumonia ~ 2000   Pulmonary embolism (HCC) 12/2013   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    Past Surgical History:  Procedure Laterality Date   ANTERIOR CERVICAL DECOMP/DISCECTOMY FUSION  2010   C40-C5   APPENDECTOMY     BACK SURGERY     COLONOSCOPY     SHOULDER ARTHROSCOPY W/ ROTATOR CUFF REPAIR Bilateral 2000's   twice on right; once on the left   SHOULDER ARTHROSCOPY WITH SUBACROMIAL DECOMPRESSION Right 12/15/2013   Procedure: RIGHT SHOULDER ARTHROSCOPY WITH EXTENSIVE DEBRIDEMENT OF ROTATOR CUFF,SUBACROMIAL DECOMPRESSION, DEBRIDMENT OF BURSA;  Surgeon: Toribio JULIANNA Chancy, MD;  Location: [facility];  Service: Orthopedics;  Laterality: Right;   TONSILLECTOMY      Allergies as of 09/20/2024       Reactions   Egg Protein-containing Drug Products  Diarrhea   Lactose Intolerance (gi) Nausea Only   Efavirenz Rash        Medication List    ALPRAZolam  1 MG tablet Commonly known as: XANAX  Take by mouth.   ALPRAZolam  1 MG 24 hr tablet Commonly known as: XANAX  XR Take 1 tablet by mouth 3 (three) times daily.   atorvastatin  10 MG tablet Commonly known as: LIPITOR Take 1 tablet (10 mg total) by mouth daily.   Biktarvy 50-200-25 MG Tabs tablet Generic drug: bictegravir-emtricitabine -tenofovir  AF Take 1 tablet by mouth daily.   buPROPion 300 MG 24 hr tablet Commonly known as:  WELLBUTRIN XL Take 300 mg by mouth daily.   Eliquis  5 MG Tabs tablet Generic drug: apixaban  TAKE 1 TABLET(5 MG) BY MOUTH TWICE DAILY   finasteride 5 MG tablet Commonly known as: PROSCAR Take 5 mg by mouth daily.   levocetirizine 5 MG tablet Commonly known as: XYZAL SMARTSIG:1 Tablet(s) By Mouth Every Evening   mometasone 50 MCG/ACT nasal spray Commonly known as: NASONEX 2 sprays 2 (two) times daily.   olmesartan -hydrochlorothiazide  40-12.5 MG tablet Commonly known as: BENICAR  HCT Take 1 tablet by mouth daily.   Olopatadine  HCl 0.2 % Soln Apply 1 drop to eye daily. Started by: Michael Denis, MD   omega-3 acid ethyl esters 1 g capsule Commonly known as: LOVAZA  Take 2 capsules (2 g total) by mouth 2 (two) times daily.   pantoprazole  40 MG tablet Commonly known as: Protonix  Take 1 tablet (40 mg total) by mouth daily. Started by: Michael Denis, MD   Pifeltro 100 MG Tabs tablet Generic drug: doravirine Take 100 mg by mouth daily.   Ryaltris  334-74 MCG/ACT Susp Generic drug: Olopatadine -Mometasone Place 2 sprays into the nose 2 (two) times daily as needed. Started by: Jakota Manthei, MD   tadalafil 5 MG tablet Commonly known as: CIALIS Take 5 mg by mouth.   tamsulosin 0.4 MG Caps capsule Commonly known as: FLOMAX Take 0.4 mg by mouth daily.   valACYclovir  500 MG tablet Commonly known as: VALTREX  Take 500 mg by mouth as needed.    Review of systems negative except as noted in HPI / PMHx or noted below:  Review of Systems  Constitutional: Negative.   HENT: Negative.    Eyes: Negative.   Respiratory: Negative.    Cardiovascular: Negative.   Gastrointestinal: Negative.   Genitourinary: Negative.   Musculoskeletal: Negative.   Skin: Negative.   Neurological: Negative.   Endo/Heme/Allergies: Negative.   Psychiatric/Behavioral: Negative.      Family History  Problem Relation Age of Onset   Diabetes Mother    Heart attack Paternal Grandfather    Deep vein  thrombosis Father    Allergic rhinitis Father    Asthma Neg Hx    Eczema Neg Hx    Urticaria Neg Hx     Social History   Socioeconomic History   Marital status: Divorced    Spouse name: Not on file   Number of children: Not on file   Years of education: Not on file   Highest education level: Not on file  Occupational History   Occupation: Retired-clinical research  Tobacco Use   Smoking status: Never   Smokeless tobacco: Never  Substance and Sexual Activity   Alcohol use: Yes    Alcohol/week: 0.0 standard drinks of alcohol    Comment: Occasional.   Drug use: No   Sexual activity: Yes  Other Topics Concern   Not on file  Social History Narrative   Not on  file   Social Drivers of Health   Tobacco Use: Low Risk (02/24/2024)   Received from [facility]   Patient History    Smoking Tobacco Use: Never    Smokeless Tobacco Use: Never    Passive Exposure: Not on file  Financial Resource Strain: Not on file  Food Insecurity: No Food Insecurity (04/13/2023)   Hunger Vital Sign    Worried About Running Out of Food in the Last Year: Never true    Ran Out of Food in the Last Year: Never true  Transportation Needs: No Transportation Needs (04/13/2023)   PRAPARE - Administrator, Civil Service (Medical): No    Lack of Transportation (Non-Medical): No  Physical Activity: Not on file  Stress: Not on file  Social Connections: Unknown (02/18/2022)   Received from [facility]   Social Network    Social Network: Not on file  Intimate Partner Violence: Not At Risk (04/13/2023)   Humiliation, Afraid, Rape, and Kick questionnaire    Fear of Current or Ex-Partner: No    Emotionally Abused: No    Physically Abused: No    Sexually Abused: No  Depression (PHQ2-9): Low Risk (05/19/2024)   Depression (PHQ2-9)    PHQ-2 Score: 0  Alcohol Screen: Not on file  Housing: Low Risk (04/13/2023)   Housing    Last Housing Risk Score: 0  Utilities: Not At Risk (04/13/2023)   AHC  Utilities    Threatened with loss of utilities: No  Health Literacy: Not on file    Environmental and Social history  Lives in a townhouse with a dry environment, cat located inside the household, carpet in the bedroom, plastic on the bed, plastic on the pillow, and no smoking ongoing with inside the household.  Objective:   Vitals:   09/20/24 1411  BP: 110/60  Pulse: 76  Resp: 20  Temp: 98.6 F (37 C)  SpO2: 95%   Height: 5' 5 (165.1 cm) Weight: 189 lb (85.7 kg)  Physical Exam Constitutional:      Appearance: He is not diaphoretic.  HENT:     Head: Normocephalic.     Right Ear: Tympanic membrane, ear canal and external ear normal.     Left Ear: Tympanic membrane, ear canal and external ear normal.     Nose: Nose normal. No mucosal edema or rhinorrhea.     Mouth/Throat:     Pharynx: Uvula midline. No oropharyngeal exudate.  Eyes:     Conjunctiva/sclera: Conjunctivae normal.  Neck:     Thyroid: No thyromegaly.     Trachea: Trachea normal. No tracheal tenderness or tracheal deviation.  Cardiovascular:     Rate and Rhythm: Normal rate and regular rhythm.     Heart sounds: Normal heart sounds, S1 normal and S2 normal. No murmur heard. Pulmonary:     Effort: No respiratory distress.     Breath sounds: Normal breath sounds. No stridor. No wheezing or rales.  Lymphadenopathy:     Head:     Right side of head: No tonsillar adenopathy.     Left side of head: No tonsillar adenopathy.     Cervical: No cervical adenopathy.  Skin:    Findings: No erythema or rash.     Nails: There is no clubbing.  Neurological:     Mental Status: He is alert.     Diagnostics: Allergy skin tests were performed.   Spirometry was performed and demonstrated an FEV1 of *** @ *** % of predicted. FEV1/FVC = ***  The patient had an Asthma Control Test with the following results:  .     Assessment and Plan:    1. Allergic rhinitis due to pollen, unspecified seasonality   2. LPRD  (laryngopharyngeal reflux disease)     Patient Instructions   1. Return to clinic for skin testing (no antihistamines)  2. Treat and prevent inflammation of airway:   A. Ryaltris  - 2 sprays each nostril 1-2 times per day  3. Treat and prevent reflux:   A. Avoid caffeine as much as possible  B. Pantoprazole  40 mg - 1 tablet 1 time per day  C. Replace throat clearing with swallowing / drinking maneuver  4. If needed:   A. Pataday  - 1 drop each eye 1 time per day  B. OTC oral antihistamine - 1-2 times per day  5. Influenza = Tamiflu. Covid = Paxlovid    Michael DOROTHA Denis, MD Allergy / Immunology [facility] 

## 2024-09-21 ENCOUNTER — Encounter: Payer: Self-pay | Admitting: Allergy and Immunology

## 2024-09-27 ENCOUNTER — Ambulatory Visit: Admitting: Allergy and Immunology

## 2024-09-27 DIAGNOSIS — J3089 Other allergic rhinitis: Secondary | ICD-10-CM

## 2024-09-27 DIAGNOSIS — J301 Allergic rhinitis due to pollen: Secondary | ICD-10-CM

## 2024-10-03 NOTE — Progress Notes (Signed)
 Michael [name] returns to this clinic for skin testing.  Allergy skin testing identified hypersensitivity against dust mite, dog, cat, tree.  Allergen avoidance measures were provided.

## 2024-10-07 ENCOUNTER — Telehealth: Payer: Self-pay

## 2024-10-07 ENCOUNTER — Other Ambulatory Visit (HOSPITAL_COMMUNITY): Payer: Self-pay

## 2024-10-07 NOTE — Telephone Encounter (Signed)
 Questions populated and submitted to plan, pending determination.

## 2024-10-07 NOTE — Telephone Encounter (Signed)
*  AA  Pharmacy Patient Advocate Encounter   Received notification from Fax that prior authorization for Ryaltris  is required/requested.   Insurance verification completed.   The patient is insured through [facility] [employer].   Per test claim: PA required; PA started via CoverMyMeds. KEY [id number] . Waiting for clinical questions to populate.

## 2024-10-10 NOTE — Telephone Encounter (Signed)
 Pharmacy Patient Advocate Encounter  Received notification from [employer] that Prior Authorization for Ryaltris  has been APPROVED from 10/07/2024 to 10/05/2025   PA #/Case ID/Reference #: [id number]

## 2024-10-17 ENCOUNTER — Ambulatory Visit: Admitting: Sports Medicine

## 2024-10-17 ENCOUNTER — Other Ambulatory Visit: Payer: Self-pay

## 2024-10-17 ENCOUNTER — Encounter: Payer: Self-pay | Admitting: Sports Medicine

## 2024-10-17 DIAGNOSIS — M25551 Pain in right hip: Secondary | ICD-10-CM | POA: Diagnosis not present

## 2024-10-17 DIAGNOSIS — G8929 Other chronic pain: Secondary | ICD-10-CM | POA: Diagnosis not present

## 2024-10-17 DIAGNOSIS — M1611 Unilateral primary osteoarthritis, right hip: Secondary | ICD-10-CM

## 2024-10-17 MED ORDER — LIDOCAINE HCL 1 % IJ SOLN
4.0000 mL | INTRAMUSCULAR | Status: AC | PRN
  Administered 2024-10-17: 4 mL

## 2024-10-17 MED ORDER — METHYLPREDNISOLONE ACETATE 40 MG/ML IJ SUSP
80.0000 mg | INTRAMUSCULAR | Status: AC | PRN
  Administered 2024-10-17: 80 mg via INTRA_ARTICULAR

## 2024-10-17 NOTE — Progress Notes (Signed)
 "  Michael [name] - 69 y.o. male MRN [id number]  Date of birth: Sep 11, 1956  Office Visit Note: Visit Date: 10/17/2024 PCP: Burney Darice CROME, MD Referred by: Burney Darice CROME, MD  Subjective: Chief Complaint  Patient presents with   Right Hip - Pain   HPI: Michael [name] is a pleasant 69 y.o. male who presents today for chronic right hip pain.  Rob has had pain in the hip radiating into the groin for the past few months.  He notices this when going from a sitting to standing position or transferring in and out of the car.  He uses Tylenol  only as needed.  Unable to take NSAIDs given his Eliquis  use.  He is here for consideration of intra-articular injection, also has questions regarding safe physical activity.   Pertinent ROS were reviewed with the patient and found to be negative unless otherwise specified above in HPI.   Assessment & Plan: Visit Diagnoses:  1. Primary osteoarthritis of right hip   2. Chronic right hip pain    Plan: Impression is symptomatic right hip osteoarthritis which is mild to moderate in nature.  Reviewed x-rays, did not show any indication for surgical hip replacement at this time.  Through shared decision making, did proceed with ultrasound-guided right hip intra-articular injection, patient tolerated well.  Advised on postinjection protocol.  May use ice/heat and/or Tylenol  for any postinjection pain.  After 48 to 72 hours of modified activity from the injection, he is safe to resume physical activity as I do think this would be beneficial for him. Recommended walking, stationary bicycle/cycling, aquatic therapy/swimming.  I do believe things like yoga and/or tai chi would also be helpful to maintain strength as well as flexibility about the hip joint.   Follow-up: Return if symptoms worsen or fail to improve.   Meds & Orders: No orders of the defined types were placed in this encounter.   Orders Placed This Encounter  Procedures   Large Joint Inj   US  Guided  Needle Placement - No Linked Charges     Procedures: Large Joint Inj: R hip joint on 10/17/2024 11:29 AM Indications: pain Details: 22 G 3.5 in needle, ultrasound-guided anterior approach Medications: 4 mL lidocaine  1 %; 80 mg methylPREDNISolone  acetate 40 MG/ML Outcome: tolerated well, no immediate complications  Procedure: US -guided intra-articular hip injection, Right After discussion on risks/benefits/indications and informed verbal consent was obtained, a timeout was performed. Patient was lying supine on exam table. The hip was cleaned with betadine and alcohol swabs. Then utilizing ultrasound guidance, the patient's femoral head and neck junction was identified and subsequently injected with 4:2 lidocaine :depomedrol via an in-plane approach with ultrasound visualization of the injectate administered into the hip joint. Patient tolerated procedure well without immediate complications.  Procedure, treatment alternatives, risks and benefits explained, specific risks discussed. Consent was given by the patient. Immediately prior to procedure a time out was called to verify the correct patient, procedure, equipment, support staff and site/side marked as required. Patient was prepped and draped in the usual sterile fashion.          Clinical History: No specialty comments available.  He reports that he has never smoked. He has never used smokeless tobacco. No results for input(s): HGBA1C, LABURIC in the last 8760 hours.  Objective:    Physical Exam  Gen: Well-appearing, in no acute distress; non-toxic CV: Well-perfused. Warm.  Resp: Breathing unlabored on room air; no wheezing. Psych: Fluid speech in conversation; appropriate affect; normal thought  process  Ortho Exam - Right hip: No redness swelling or effusion.  There is pain with hip flexion and transferring getting in and out of the car/seated to standing position.  Positive Stinchfield test.  Imaging:  *Independent review  and interpretation of 2 view right hip x-ray from 08/16/2024 was performed by myself today.  X-rays demonstrate mild to moderate osteoarthritic change with early subchondral cystic change and periarticular spurring.  There is no advanced arthropathy noted.  No acute fracture.  Past Medical/Family/Surgical/Social History: Medications & Allergies reviewed per EMR, new medications updated. Patient Active Problem List   Diagnosis Date Noted   Acute deep vein thrombosis (DVT) of right lower extremity (HCC) 10/01/2015   Sinusitis 10/01/2015   Hypokalemia 10/01/2015   DVT (deep venous thrombosis) (HCC) 10/01/2015   Encounter for therapeutic drug monitoring 08/03/2015   Essential hypertension 07/29/2015   Hyperlipidemia 07/29/2015   Pneumonia 02/01/2014   HIV disease (HCC) 02/01/2014   Acute pulmonary embolism (HCC) 02/01/2014   Pulmonary embolism (HCC) 01/31/2014   Impingement syndrome of right shoulder 12/15/2013   Sacroiliac joint pain 04/12/2013   Pain in joint, lower leg 02/24/2013   Pain in left hip 02/24/2013   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 02/24/2013   Abnormal EKG 07/07/2011   Preop cardiovascular exam 07/07/2011   ABDOMINAL PAIN, EPIGASTRIC 03/09/2009   UNSPECIFIED LABYRINTHITIS 01/24/2009   FOOT PAIN, LEFT 11/03/2008   MORTON'S NEUROMA, 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   PLANTAR FASCIITIS, BILATERAL 09/26/2008   Hepatitis B virus infection 10/18/2007   VENEREAL WART 10/18/2007   LIVER HEMANGIOMA 10/18/2007   HYPERLIPIDEMIA 10/18/2007   DEPRESSION 10/18/2007   HAY FEVER 10/18/2007   PNEUMONIA 10/18/2007   ASTHMA, CHILDHOOD 10/18/2007   GASTROESOPHAGEAL REFLUX DISEASE 10/18/2007   Other acquired absence of organ 10/18/2007   ROTATOR CUFF REPAIR, RIGHT, HX OF 10/18/2007   HIV DISEASE 08/05/2007   Past Medical History:  Diagnosis Date   Anxiety    Childhood asthma    DVT (deep venous thrombosis) (HCC) 07/2015; 10/01/2015   RLE; RLE   GERD (gastroesophageal reflux disease)     Hepatitis B    HIV (human immunodeficiency virus infection) (HCC)    HTN (hypertension)    Hyperlipidemia    Pneumonia ~ 2000   Pulmonary embolism (HCC) 12/2013   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   Family History  Problem Relation Age of Onset   Diabetes Mother    Heart attack Paternal Grandfather    Deep vein thrombosis Father    Allergic rhinitis Father    Asthma Neg Hx    Eczema Neg Hx    Urticaria Neg Hx    Past Surgical History:  Procedure Laterality Date   ANTERIOR CERVICAL DECOMP/DISCECTOMY FUSION  2010   C40-C5   APPENDECTOMY     BACK SURGERY     COLONOSCOPY     SHOULDER ARTHROSCOPY W/ ROTATOR CUFF REPAIR Bilateral 2000's   twice on right; once on the left   SHOULDER ARTHROSCOPY WITH SUBACROMIAL DECOMPRESSION Right 12/15/2013   Procedure: RIGHT SHOULDER ARTHROSCOPY WITH EXTENSIVE DEBRIDEMENT OF ROTATOR CUFF,SUBACROMIAL DECOMPRESSION, DEBRIDMENT OF BURSA;  Surgeon: Toribio JULIANNA Chancy, MD;  Location: [facility];  Service: Orthopedics;  Laterality: Right;   TONSILLECTOMY     Social History   Occupational History   Occupation: Retired-clinical research  Tobacco Use   Smoking status: Never   Smokeless tobacco: Never  Substance and Sexual Activity   Alcohol use: Yes    Alcohol/week: 0.0 standard drinks of alcohol  Comment: Occasional.   Drug use: No   Sexual activity: Yes   "

## 2024-11-08 ENCOUNTER — Ambulatory Visit: Admitting: Allergy and Immunology

## 2025-05-22 ENCOUNTER — Ambulatory Visit: Admitting: Oncology
